# Patient Record
Sex: Female | Born: 1955 | Race: Black or African American | Hispanic: No | Marital: Single | Smoking: Former smoker
Health system: Southern US, Community
[De-identification: ages and names within clinical notes are randomized; demographics above are authoritative.]

## PROBLEM LIST (undated history)

## (undated) DIAGNOSIS — M1990 Unspecified osteoarthritis, unspecified site: Secondary | ICD-10-CM

## (undated) DIAGNOSIS — J349 Unspecified disorder of nose and nasal sinuses: Secondary | ICD-10-CM

## (undated) DIAGNOSIS — R51 Headache: Secondary | ICD-10-CM

## (undated) DIAGNOSIS — K5900 Constipation, unspecified: Secondary | ICD-10-CM

## (undated) DIAGNOSIS — J301 Allergic rhinitis due to pollen: Secondary | ICD-10-CM

## (undated) DIAGNOSIS — K5790 Diverticulosis of intestine, part unspecified, without perforation or abscess without bleeding: Secondary | ICD-10-CM

## (undated) DIAGNOSIS — K219 Gastro-esophageal reflux disease without esophagitis: Secondary | ICD-10-CM

## (undated) DIAGNOSIS — K760 Fatty (change of) liver, not elsewhere classified: Secondary | ICD-10-CM

## (undated) DIAGNOSIS — K8020 Calculus of gallbladder without cholecystitis without obstruction: Secondary | ICD-10-CM

## (undated) DIAGNOSIS — K589 Irritable bowel syndrome without diarrhea: Secondary | ICD-10-CM

## (undated) DIAGNOSIS — K5792 Diverticulitis of intestine, part unspecified, without perforation or abscess without bleeding: Secondary | ICD-10-CM

## (undated) DIAGNOSIS — K529 Noninfective gastroenteritis and colitis, unspecified: Secondary | ICD-10-CM

## (undated) HISTORY — DX: Irritable bowel syndrome without diarrhea: K58.9

## (undated) HISTORY — DX: Fatty (change of) liver, not elsewhere classified: K76.0

## (undated) HISTORY — DX: Calculus of gallbladder without cholecystitis without obstruction: K80.20

## (undated) HISTORY — PX: TONSILLECTOMY: SUR1361

## (undated) HISTORY — DX: Irritable bowel syndrome, unspecified: K58.9

## (undated) HISTORY — PX: FOOT SURGERY: SHX648

## (undated) HISTORY — PX: CATARACT EXTRACTION: SUR2

---

## 1997-07-07 ENCOUNTER — Inpatient Hospital Stay (HOSPITAL_COMMUNITY): Admission: EM | Admit: 1997-07-07 | Discharge: 1997-07-09 | Payer: Self-pay | Admitting: Emergency Medicine

## 1997-08-06 ENCOUNTER — Ambulatory Visit (HOSPITAL_COMMUNITY): Admission: RE | Admit: 1997-08-06 | Discharge: 1997-08-06 | Payer: Self-pay | Admitting: Gastroenterology

## 1997-10-10 ENCOUNTER — Ambulatory Visit (HOSPITAL_COMMUNITY): Admission: RE | Admit: 1997-10-10 | Discharge: 1997-10-10 | Payer: Self-pay | Admitting: Gastroenterology

## 1999-08-17 ENCOUNTER — Other Ambulatory Visit: Admission: RE | Admit: 1999-08-17 | Discharge: 1999-08-17 | Payer: Self-pay | Admitting: Obstetrics and Gynecology

## 2000-05-08 ENCOUNTER — Encounter: Payer: Self-pay | Admitting: Emergency Medicine

## 2000-05-08 ENCOUNTER — Emergency Department (HOSPITAL_COMMUNITY): Admission: EM | Admit: 2000-05-08 | Discharge: 2000-05-08 | Payer: Self-pay | Admitting: Emergency Medicine

## 2001-01-01 ENCOUNTER — Other Ambulatory Visit: Admission: RE | Admit: 2001-01-01 | Discharge: 2001-01-01 | Payer: Self-pay | Admitting: Obstetrics and Gynecology

## 2001-03-28 HISTORY — PX: ABDOMINAL HYSTERECTOMY: SHX81

## 2001-04-25 ENCOUNTER — Inpatient Hospital Stay (HOSPITAL_COMMUNITY): Admission: RE | Admit: 2001-04-25 | Discharge: 2001-04-28 | Payer: Self-pay | Admitting: Obstetrics and Gynecology

## 2002-05-15 ENCOUNTER — Other Ambulatory Visit: Admission: RE | Admit: 2002-05-15 | Discharge: 2002-05-15 | Payer: Self-pay | Admitting: Obstetrics and Gynecology

## 2003-01-11 ENCOUNTER — Emergency Department (HOSPITAL_COMMUNITY): Admission: EM | Admit: 2003-01-11 | Discharge: 2003-01-11 | Payer: Self-pay | Admitting: Emergency Medicine

## 2003-10-11 ENCOUNTER — Emergency Department (HOSPITAL_COMMUNITY): Admission: EM | Admit: 2003-10-11 | Discharge: 2003-10-11 | Payer: Self-pay | Admitting: *Deleted

## 2004-03-28 DIAGNOSIS — K5792 Diverticulitis of intestine, part unspecified, without perforation or abscess without bleeding: Secondary | ICD-10-CM

## 2004-03-28 HISTORY — DX: Diverticulitis of intestine, part unspecified, without perforation or abscess without bleeding: K57.92

## 2004-12-20 ENCOUNTER — Emergency Department (HOSPITAL_COMMUNITY): Admission: EM | Admit: 2004-12-20 | Discharge: 2004-12-21 | Payer: Self-pay | Admitting: Emergency Medicine

## 2011-02-26 HISTORY — PX: COLONOSCOPY: SHX174

## 2012-01-19 ENCOUNTER — Other Ambulatory Visit: Payer: Self-pay | Admitting: Orthopedic Surgery

## 2012-01-19 MED ORDER — BUPIVACAINE 0.25 % ON-Q PUMP SINGLE CATH 300ML: 300.0000 mL | INJECTION | Status: DC

## 2012-01-19 MED ORDER — DEXAMETHASONE SODIUM PHOSPHATE 10 MG/ML IJ SOLN: 10.0000 mg | Freq: Once | INTRAMUSCULAR | Status: DC

## 2012-01-19 NOTE — Progress Notes (Signed)
Preoperative surgical orders have been place into the [facility] hospital system for Holly [name] on 01/19/2012, 1:35 PM  by Patrica Duel for surgery on 01/30/12.  Preop Total Knee orders including Bupivacaine On-Q pump, IV Tylenol, and IV Decadron as long as there are no contraindications to the above medications. Avel Peace, PA-C

## 2012-01-20 ENCOUNTER — Encounter (HOSPITAL_COMMUNITY): Payer: Self-pay | Admitting: Pharmacy Technician

## 2012-01-24 ENCOUNTER — Encounter (HOSPITAL_COMMUNITY)
Admission: RE | Admit: 2012-01-24 | Discharge: 2012-01-24 | Disposition: A | Discharge status: Home / Self Care | Payer: BC Managed Care – PPO | Source: Ambulatory Visit | Attending: Orthopedic Surgery | Admitting: Orthopedic Surgery

## 2012-01-24 ENCOUNTER — Encounter (HOSPITAL_COMMUNITY): Payer: Self-pay

## 2012-01-24 HISTORY — DX: Unspecified osteoarthritis, unspecified site: M19.90

## 2012-01-24 HISTORY — DX: Allergic rhinitis due to pollen: J30.1

## 2012-01-24 HISTORY — DX: Unspecified disorder of nose and nasal sinuses: J34.9

## 2012-01-24 LAB — URINALYSIS, ROUTINE W REFLEX MICROSCOPIC
Hgb urine dipstick: NEGATIVE
Leukocytes, UA: NEGATIVE
Protein, ur: NEGATIVE mg/dL
Urobilinogen, UA: 1 mg/dL (ref 0.0–1.0)

## 2012-01-24 LAB — CBC
HCT: 45.2 % (ref 36.0–46.0)
Hemoglobin: 15.3 g/dL — ABNORMAL HIGH (ref 12.0–15.0)
MCH: 30.6 pg (ref 26.0–34.0)
MCHC: 33.8 g/dL (ref 30.0–36.0)

## 2012-01-24 LAB — COMPREHENSIVE METABOLIC PANEL
BUN: 13 mg/dL (ref 6–23)
Calcium: 9.8 mg/dL (ref 8.4–10.5)
Creatinine, Ser: 0.72 mg/dL (ref 0.50–1.10)
GFR calc Af Amer: >90 mL/min (ref >90)
Glucose, Bld: 108 mg/dL — ABNORMAL HIGH (ref 70–99)
Total Protein: 7.4 g/dL (ref 6.0–8.3)

## 2012-01-24 LAB — PROTIME-INR: Prothrombin Time: 13.1 seconds (ref 11.6–15.2)

## 2012-01-24 LAB — SURGICAL PCR SCREEN: MRSA, PCR: NEGATIVE

## 2012-01-24 MED ORDER — CHLORHEXIDINE GLUCONATE 4 % EX LIQD
60.0000 mL | Freq: Once | CUTANEOUS | Status: DC
  Filled 2012-01-24: qty 60

## 2012-01-24 NOTE — Pre-Procedure Instructions (Signed)
20 Holly [name]  01/24/2012   Your procedure is scheduled on:  Monday 01-30-2012  Report to [facility] at  AM.  [phone]  Call this number if you have problems the morning of surgery: [phone]   Remember:   Do not drink liquids or  eat food:After Midnight. Sunday night      Take these medicines the morning of surgery with A SIP OF WATER: Hydrocodone if needed   Do not wear jewelry, make-up or nail polish.  Do not wear lotions, powders, or perfumes. You may wear deodorant.  Do not shave 48 hours prior to surgery. Men may shave face and neck.  Do not bring valuables to the hospital.  Contacts, dentures or bridgework may not be worn into surgery.  Leave suitcase in the car. After surgery it may be brought to your room.  For patients admitted to the hospital, checkout time is 11:00 AM the day of discharge.   Patients discharged the day of surgery will not be allowed to drive home.  Name and phone number of your driver: Gustavo Lah [phone]  See [facility] Preparing for surgery sheet.   Please read over the following fact sheets that you were given: MRSA Information,blood transfusion,incentive spirometry

## 2012-01-25 NOTE — H&P (Signed)
TOTAL KNEE ADMISSION H&P  Patient is being admitted for left total knee arthroplasty.  Subjective:  Chief Complaint:left knee pain.  HPI: Holly [name], 56 y.o. female, has a history of pain and functional disability in the left knee due to arthritis and has failed non-surgical conservative treatments for greater than 12 weeks to includecorticosteriod injections, viscosupplementation injections and activity modification.  Onset of symptoms was gradual, starting 6 years ago with gradually worsening course since that time. The patient noted prior procedures on the knee to include  arthroscopy on the left knee(s).  Patient currently rates pain in the left knee(s) at 8 out of 10 with activity. Patient has night pain, worsening of pain with activity and weight bearing, pain that interferes with activities of daily living, pain with passive range of motion, crepitus and joint swelling.  Patient has evidence of subchondral cysts, periarticular osteophytes and joint space narrowing by imaging studies.  There is no active infection.   Past Medical History  Diagnosis Date  . Sinus problem   . Arthritis     knees  . Hay fever     Past Surgical History  Procedure Date  . Abdominal hysterectomy 2003    partial  . Tonsillectomy      Age   Current outpatient prescriptions: carisoprodol (SOMA) 350 MG tablet, Take 350 mg by mouth 3 (three) times daily as needed. Pain,   cetirizine (ZYRTEC) 10 MG tablet, Take 10 mg by mouth daily.,  diclofenac (VOLTAREN) 75 MG EC tablet, Take 75 mg by mouth 2 (two) times daily.,  diphenhydrAMINE (BENADRYL) 25 mg capsule, Take 25 mg by mouth every 6 (six) hours as needed. Allergy,  HYDROcodone-acetaminophen (NORCO/VICODIN) 5-325 MG per tablet, Take 1 tablet by mouth every 6 (six) hours as needed., promethazine (PHENERGAN) 25 MG tablet, Take 25 mg by mouth every 6 (six) hours as needed. Nausea,    Allergies  Allergen Reactions  . Codeine Nausea And Vomiting   fever  . Morphine And Related Nausea And Vomiting    fever    History  Substance Use Topics  . Smoking status: Current Every Day Smoker -- 0.5 packs/day for 40 years  . Smokeless tobacco: Never Used  . Alcohol Use: No   Family History Father deceased age 6 due to prostate cancer Mother living age 73, healthy but has osteoarthritis  Review of Systems  Constitutional: Positive for diaphoresis. Negative for fever, chills, weight loss and malaise/fatigue.  HENT: Negative.  Negative for neck pain.   Eyes: Positive for blurred vision. Negative for double vision, photophobia, pain, discharge and redness.  Respiratory: Negative.   Cardiovascular: Negative.   Gastrointestinal: Positive for nausea and constipation. Negative for heartburn, vomiting, abdominal pain, diarrhea, blood in stool and melena.  Genitourinary: Negative.   Musculoskeletal: Positive for joint pain. Negative for myalgias, back pain and falls.       Left knee pain  Skin: Positive for itching and rash.       face  Neurological: Negative.  Negative for weakness.  Endo/Heme/Allergies: Negative.   Psychiatric/Behavioral: Negative.     Objective:  Physical Exam  Constitutional: She is oriented to person, place, and time. She appears well-developed and well-nourished. No distress.  HENT:  Head: Normocephalic and atraumatic.  Right Ear: External ear normal.  Left Ear: External ear normal.  Nose: Nose normal.  Mouth/Throat: Oropharynx is clear and moist.  Eyes: Conjunctivae normal and EOM are normal.  Neck: Normal range of motion. Neck supple. No tracheal deviation present. No  thyromegaly present.  Cardiovascular: Normal rate, regular rhythm, normal heart sounds and intact distal pulses.   No murmur heard. Respiratory: Effort normal and breath sounds normal. No respiratory distress. She has no wheezes. She exhibits no tenderness.  GI: Soft. Bowel sounds are normal. She exhibits no distension and no mass. There is no  tenderness.  Musculoskeletal:       Right hip: Normal.       Left hip: Normal.       Right knee: She exhibits decreased range of motion. She exhibits no swelling and no effusion. no tenderness found.       Left knee: She exhibits decreased range of motion and swelling. She exhibits no effusion. tenderness found. Medial joint line tenderness noted.       Legs: Lymphadenopathy:    She has no cervical adenopathy.  Neurological: She is alert and oriented to person, place, and time. She has normal strength. No sensory deficit.  Skin: No rash noted. She is not diaphoretic. No erythema.  Psychiatric: She has a normal mood and affect.     Vitals Weight: 180 lb Height: 63 in Body Surface Area: 1.91 m Body Mass Index: 31.89 kg/m Pulse: 72 (Regular) Resp.: 16 (Unlabored) BP: 116/74 (Sitting, Left Arm, Standard)   Imaging Review Plain radiographs demonstrate severe degenerative joint disease of the left knee(s). The overall alignment is moderate varus. The bone quality appears to be good for age and reported activity level.  Assessment/Plan:  End stage arthritis, left knee   The patient history, physical examination, clinical judgment of the provider and imaging studies are consistent with end stage degenerative joint disease of the left knee(s) and total knee arthroplasty is deemed medically necessary. The treatment options including medical management, injection therapy arthroscopy and arthroplasty were discussed at length. The risks and benefits of total knee arthroplasty were presented and reviewed. The risks due to aseptic loosening, infection, stiffness, patella tracking problems, thromboembolic complications and other imponderables were discussed. The patient acknowledged the explanation, agreed to proceed with the plan and consent was signed. Patient is being admitted for inpatient treatment for surgery, pain control, PT, OT, prophylactic antibiotics, VTE prophylaxis,  progressive ambulation and ADL's and discharge planning. The patient is planning to be discharged home with home health services

## 2012-01-30 ENCOUNTER — Inpatient Hospital Stay (HOSPITAL_COMMUNITY)
Admission: RE | Admit: 2012-01-30 | Discharge: 2012-02-01 | DRG: 209 | Disposition: A | Discharge status: Home Health | Payer: BC Managed Care – PPO | Source: Ambulatory Visit | Attending: Orthopedic Surgery | Admitting: Orthopedic Surgery

## 2012-01-30 ENCOUNTER — Encounter (HOSPITAL_COMMUNITY): Payer: Self-pay | Admitting: *Deleted

## 2012-01-30 ENCOUNTER — Encounter (HOSPITAL_COMMUNITY)
Admission: RE | Disposition: A | Discharge status: Home Health | Payer: Self-pay | Source: Ambulatory Visit | Attending: Orthopedic Surgery

## 2012-01-30 ENCOUNTER — Encounter (HOSPITAL_COMMUNITY): Payer: Self-pay | Admitting: Anesthesiology

## 2012-01-30 ENCOUNTER — Encounter (HOSPITAL_COMMUNITY): Payer: Self-pay | Admitting: Orthopedic Surgery

## 2012-01-30 ENCOUNTER — Ambulatory Visit (HOSPITAL_COMMUNITY): Payer: BC Managed Care – PPO | Admitting: Anesthesiology

## 2012-01-30 DIAGNOSIS — E871 Hypo-osmolality and hyponatremia: Secondary | ICD-10-CM

## 2012-01-30 DIAGNOSIS — Z96659 Presence of unspecified artificial knee joint: Secondary | ICD-10-CM

## 2012-01-30 DIAGNOSIS — M179 Osteoarthritis of knee, unspecified: Secondary | ICD-10-CM

## 2012-01-30 DIAGNOSIS — M1710 Unilateral primary osteoarthritis, unspecified knee: Principal | ICD-10-CM | POA: Diagnosis present

## 2012-01-30 DIAGNOSIS — Z01812 Encounter for preprocedural laboratory examination: Secondary | ICD-10-CM

## 2012-01-30 DIAGNOSIS — F17200 Nicotine dependence, unspecified, uncomplicated: Secondary | ICD-10-CM | POA: Diagnosis present

## 2012-01-30 HISTORY — PX: TOTAL KNEE ARTHROPLASTY: SHX125

## 2012-01-30 LAB — TYPE AND SCREEN

## 2012-01-30 SURGERY — ARTHROPLASTY, KNEE, TOTAL
Anesthesia: Spinal | Site: Knee | Laterality: Left | Wound class: Clean

## 2012-01-30 MED ORDER — BUPIVACAINE 0.25 % ON-Q PUMP SINGLE CATH 300ML
INJECTION | Status: DC | PRN
  Administered 2012-01-30: 300 mL

## 2012-01-30 MED ORDER — ACETAMINOPHEN 325 MG PO TABS: 650.0000 mg | ORAL_TABLET | Freq: Four times a day (QID) | ORAL | Status: DC | PRN

## 2012-01-30 MED ORDER — MENTHOL 3 MG MT LOZG: 1.0000 | LOZENGE | OROMUCOSAL | Status: DC | PRN

## 2012-01-30 MED ORDER — PROPOFOL 10 MG/ML IV EMUL
INTRAVENOUS | Status: DC | PRN
  Administered 2012-01-30: 75 ug/kg/min via INTRAVENOUS

## 2012-01-30 MED ORDER — 0.9 % SODIUM CHLORIDE (POUR BTL) OPTIME
TOPICAL | Status: DC | PRN
  Administered 2012-01-30: 1000 mL

## 2012-01-30 MED ORDER — DEXAMETHASONE SODIUM PHOSPHATE 10 MG/ML IJ SOLN
10.0000 mg | Freq: Once | INTRAMUSCULAR | Status: AC
  Filled 2012-01-30: qty 1

## 2012-01-30 MED ORDER — CEFAZOLIN SODIUM-DEXTROSE 2-3 GM-% IV SOLR
2.0000 g | INTRAVENOUS | Status: AC
  Administered 2012-01-30: 2 g via INTRAVENOUS

## 2012-01-30 MED ORDER — ACETAMINOPHEN 650 MG RE SUPP: 650.0000 mg | Freq: Four times a day (QID) | RECTAL | Status: DC | PRN

## 2012-01-30 MED ORDER — TRAMADOL HCL 50 MG PO TABS
50.0000 mg | ORAL_TABLET | Freq: Four times a day (QID) | ORAL | Status: DC | PRN
Start: 2012-01-30 — End: 2012-02-01

## 2012-01-30 MED ORDER — SODIUM CHLORIDE 0.9 % IR SOLN
Status: DC | PRN
  Administered 2012-01-30: 1000 mL

## 2012-01-30 MED ORDER — BUPIVACAINE IN DEXTROSE 0.75-8.25 % IT SOLN
INTRATHECAL | Status: DC | PRN
  Administered 2012-01-30: 1.5 mL via INTRATHECAL

## 2012-01-30 MED ORDER — METOCLOPRAMIDE HCL 5 MG/ML IJ SOLN
5.0000 mg | Freq: Three times a day (TID) | INTRAMUSCULAR | Status: DC | PRN
  Administered 2012-01-30 – 2012-01-31 (×3): 10 mg via INTRAVENOUS
  Filled 2012-01-30 (×3): qty 2

## 2012-01-30 MED ORDER — MIDAZOLAM HCL 5 MG/5ML IJ SOLN
INTRAMUSCULAR | Status: DC | PRN
  Administered 2012-01-30: 2 mg via INTRAVENOUS

## 2012-01-30 MED ORDER — PROMETHAZINE HCL 25 MG PO TABS: 25.0000 mg | ORAL_TABLET | Freq: Four times a day (QID) | ORAL | Status: DC | PRN

## 2012-01-30 MED ORDER — HYDROMORPHONE HCL PF 1 MG/ML IJ SOLN
0.5000 mg | INTRAMUSCULAR | Status: DC | PRN
  Administered 2012-01-30 (×2): 1 mg via INTRAVENOUS
  Administered 2012-01-30: 0.5 mg via INTRAVENOUS
  Administered 2012-01-31 – 2012-02-01 (×3): 1 mg via INTRAVENOUS
  Filled 2012-01-30 (×5): qty 1

## 2012-01-30 MED ORDER — POLYETHYLENE GLYCOL 3350 17 G PO PACK: 17.0000 g | PACK | Freq: Every day | ORAL | Status: DC | PRN

## 2012-01-30 MED ORDER — ACETAMINOPHEN 10 MG/ML IV SOLN
INTRAVENOUS | Status: AC
  Filled 2012-01-30: qty 100

## 2012-01-30 MED ORDER — HYDROMORPHONE HCL PF 1 MG/ML IJ SOLN
INTRAMUSCULAR | Status: AC
  Filled 2012-01-30: qty 1

## 2012-01-30 MED ORDER — BUPIVACAINE 0.25 % ON-Q PUMP SINGLE CATH 300ML
INJECTION | Status: AC
  Filled 2012-01-30: qty 300

## 2012-01-30 MED ORDER — CEFAZOLIN SODIUM-DEXTROSE 2-3 GM-% IV SOLR
INTRAVENOUS | Status: AC
  Filled 2012-01-30: qty 50

## 2012-01-30 MED ORDER — HYDROMORPHONE HCL 2 MG PO TABS
2.0000 mg | ORAL_TABLET | ORAL | Status: DC | PRN
  Administered 2012-01-30 (×2): 4 mg via ORAL
  Administered 2012-01-31 (×3): 2 mg via ORAL
  Administered 2012-01-31: 4 mg via ORAL
  Administered 2012-01-31 – 2012-02-01 (×2): 2 mg via ORAL
  Administered 2012-02-01: 4 mg via ORAL
  Filled 2012-01-30: qty 1
  Filled 2012-01-30 (×2): qty 2
  Filled 2012-01-30: qty 1
  Filled 2012-01-30: qty 2
  Filled 2012-01-30: qty 1
  Filled 2012-01-30: qty 2
  Filled 2012-01-30 (×2): qty 1

## 2012-01-30 MED ORDER — FENTANYL CITRATE 0.05 MG/ML IJ SOLN
INTRAMUSCULAR | Status: DC | PRN
  Administered 2012-01-30: 100 ug via INTRAVENOUS

## 2012-01-30 MED ORDER — PHENOL 1.4 % MT LIQD: 1.0000 | OROMUCOSAL | Status: DC | PRN

## 2012-01-30 MED ORDER — METOCLOPRAMIDE HCL 10 MG PO TABS
5.0000 mg | ORAL_TABLET | Freq: Three times a day (TID) | ORAL | Status: DC | PRN
  Administered 2012-02-01: 10 mg via ORAL
  Filled 2012-01-30: qty 1

## 2012-01-30 MED ORDER — BISACODYL 10 MG RE SUPP: 10.0000 mg | Freq: Every day | RECTAL | Status: DC | PRN

## 2012-01-30 MED ORDER — ONDANSETRON HCL 4 MG/2ML IJ SOLN
4.0000 mg | Freq: Four times a day (QID) | INTRAMUSCULAR | Status: DC | PRN
  Administered 2012-01-31: 4 mg via INTRAVENOUS
  Filled 2012-01-30: qty 2

## 2012-01-30 MED ORDER — LACTATED RINGERS IV SOLN: INTRAVENOUS | Status: DC

## 2012-01-30 MED ORDER — ONDANSETRON HCL 4 MG PO TABS
4.0000 mg | ORAL_TABLET | Freq: Four times a day (QID) | ORAL | Status: DC | PRN
  Administered 2012-01-31: 4 mg via ORAL
  Filled 2012-01-30: qty 1

## 2012-01-30 MED ORDER — HYDROMORPHONE HCL PF 1 MG/ML IJ SOLN
INTRAMUSCULAR | Status: AC
  Administered 2012-01-30: 0.5 mg
  Filled 2012-01-30: qty 1

## 2012-01-30 MED ORDER — SODIUM CHLORIDE 0.9 % IV SOLN: INTRAVENOUS | Status: DC

## 2012-01-30 MED ORDER — ACETAMINOPHEN 10 MG/ML IV SOLN
1000.0000 mg | Freq: Four times a day (QID) | INTRAVENOUS | Status: AC
  Administered 2012-01-30 – 2012-01-31 (×4): 1000 mg via INTRAVENOUS
  Filled 2012-01-30 (×6): qty 100

## 2012-01-30 MED ORDER — LORATADINE 10 MG PO TABS
10.0000 mg | ORAL_TABLET | Freq: Every day | ORAL | Status: DC
  Administered 2012-01-30 – 2012-02-01 (×3): 10 mg via ORAL
  Filled 2012-01-30 (×3): qty 1

## 2012-01-30 MED ORDER — BUPIVACAINE ON-Q PAIN PUMP (FOR ORDER SET NO CHG)
INJECTION | Status: DC
  Filled 2012-01-30: qty 1

## 2012-01-30 MED ORDER — RIVAROXABAN 10 MG PO TABS
10.0000 mg | ORAL_TABLET | Freq: Every day | ORAL | Status: DC
  Administered 2012-01-31 – 2012-02-01 (×2): 10 mg via ORAL
  Filled 2012-01-30 (×3): qty 1

## 2012-01-30 MED ORDER — SODIUM CHLORIDE 0.9 % IV SOLN
INTRAVENOUS | Status: DC
  Administered 2012-01-30 – 2012-01-31 (×2): via INTRAVENOUS
  Administered 2012-01-31: 20 mL/h via INTRAVENOUS

## 2012-01-30 MED ORDER — ONDANSETRON HCL 4 MG/2ML IJ SOLN
INTRAMUSCULAR | Status: DC | PRN
  Administered 2012-01-30: 4 mg via INTRAVENOUS

## 2012-01-30 MED ORDER — METHOCARBAMOL 500 MG PO TABS
500.0000 mg | ORAL_TABLET | Freq: Four times a day (QID) | ORAL | Status: DC | PRN
  Administered 2012-01-31 – 2012-02-01 (×5): 500 mg via ORAL
  Filled 2012-01-30 (×5): qty 1

## 2012-01-30 MED ORDER — ACETAMINOPHEN 10 MG/ML IV SOLN
1000.0000 mg | Freq: Once | INTRAVENOUS | Status: AC
  Administered 2012-01-30: 1000 mg via INTRAVENOUS

## 2012-01-30 MED ORDER — DEXAMETHASONE 6 MG PO TABS
10.0000 mg | ORAL_TABLET | Freq: Once | ORAL | Status: AC
  Administered 2012-01-31: 10 mg via ORAL
  Filled 2012-01-30: qty 1

## 2012-01-30 MED ORDER — HYDROMORPHONE HCL PF 1 MG/ML IJ SOLN
0.2500 mg | INTRAMUSCULAR | Status: DC | PRN
  Administered 2012-01-30 (×2): 0.5 mg via INTRAVENOUS

## 2012-01-30 MED ORDER — METHOCARBAMOL 100 MG/ML IJ SOLN
500.0000 mg | Freq: Four times a day (QID) | INTRAVENOUS | Status: DC | PRN
  Administered 2012-01-30 (×2): 500 mg via INTRAVENOUS
  Filled 2012-01-30 (×2): qty 5

## 2012-01-30 MED ORDER — FLEET ENEMA 7-19 GM/118ML RE ENEM: 1.0000 | ENEMA | Freq: Once | RECTAL | Status: AC | PRN

## 2012-01-30 MED ORDER — LACTATED RINGERS IV SOLN
INTRAVENOUS | Status: DC | PRN
  Administered 2012-01-30 (×2): via INTRAVENOUS

## 2012-01-30 MED ORDER — DIPHENHYDRAMINE HCL 12.5 MG/5ML PO ELIX: 12.5000 mg | ORAL_SOLUTION | ORAL | Status: DC | PRN

## 2012-01-30 MED ORDER — DOCUSATE SODIUM 100 MG PO CAPS
100.0000 mg | ORAL_CAPSULE | Freq: Two times a day (BID) | ORAL | Status: DC
  Administered 2012-01-30 – 2012-02-01 (×4): 100 mg via ORAL

## 2012-01-30 MED ORDER — CEFAZOLIN SODIUM 1-5 GM-% IV SOLN
1.0000 g | Freq: Four times a day (QID) | INTRAVENOUS | Status: AC
  Administered 2012-01-30 (×2): 1 g via INTRAVENOUS
  Filled 2012-01-30 (×2): qty 50

## 2012-01-30 SURGICAL SUPPLY — 55 items
BAG SPEC THK2 15X12 ZIP CLS (MISCELLANEOUS) ×1
BAG ZIPLOCK 12X15 (MISCELLANEOUS) ×2 IMPLANT
BANDAGE ELASTIC 6 VELCRO ST LF (GAUZE/BANDAGES/DRESSINGS) ×2 IMPLANT
BANDAGE ESMARK 6X9 LF (GAUZE/BANDAGES/DRESSINGS) ×1 IMPLANT
BLADE SAG 18X100X1.27 (BLADE) ×2 IMPLANT
BLADE SAW SGTL 11.0X1.19X90.0M (BLADE) ×2 IMPLANT
BNDG CMPR 9X6 STRL LF SNTH (GAUZE/BANDAGES/DRESSINGS) ×1
BNDG ESMARK 6X9 LF (GAUZE/BANDAGES/DRESSINGS) ×2
BOWL SMART MIX CTS (DISPOSABLE) ×2 IMPLANT
CATH KIT ON-Q SILVERSOAK 5IN (CATHETERS) ×2 IMPLANT
CEMENT HV SMART SET (Cement) ×4 IMPLANT
CLOSURE STERI-STRIP 1/4X4 (GAUZE/BANDAGES/DRESSINGS) ×2 IMPLANT
CLOTH BEACON ORANGE TIMEOUT ST (SAFETY) ×2 IMPLANT
CUFF TOURN SGL QUICK 34 (TOURNIQUET CUFF) ×2
CUFF TRNQT CYL 34X4X40X1 (TOURNIQUET CUFF) ×1 IMPLANT
DRAPE EXTREMITY T 121X128X90 (DRAPE) ×2 IMPLANT
DRAPE POUCH INSTRU U-SHP 10X18 (DRAPES) ×2 IMPLANT
DRAPE U-SHAPE 47X51 STRL (DRAPES) ×2 IMPLANT
DRSG ADAPTIC 3X8 NADH LF (GAUZE/BANDAGES/DRESSINGS) ×2 IMPLANT
DRSG PAD ABDOMINAL 8X10 ST (GAUZE/BANDAGES/DRESSINGS) ×2 IMPLANT
DURAPREP 26ML APPLICATOR (WOUND CARE) ×2 IMPLANT
ELECT REM PT RETURN 9FT ADLT (ELECTROSURGICAL) ×2
ELECTRODE REM PT RTRN 9FT ADLT (ELECTROSURGICAL) ×1 IMPLANT
EVACUATOR 1/8 PVC DRAIN (DRAIN) ×2 IMPLANT
FACESHIELD LNG OPTICON STERILE (SAFETY) ×10 IMPLANT
GLOVE BIO SURGEON STRL SZ8 (GLOVE) ×2 IMPLANT
GLOVE BIOGEL PI IND STRL 8 (GLOVE) ×2 IMPLANT
GLOVE BIOGEL PI INDICATOR 8 (GLOVE) ×2
GLOVE ECLIPSE 8.0 STRL XLNG CF (GLOVE) ×2 IMPLANT
GLOVE SURG SS PI 6.5 STRL IVOR (GLOVE) ×4 IMPLANT
GOWN STRL NON-REIN LRG LVL3 (GOWN DISPOSABLE) ×4 IMPLANT
GOWN STRL REIN XL XLG (GOWN DISPOSABLE) ×2 IMPLANT
HANDPIECE INTERPULSE COAX TIP (DISPOSABLE) ×1
IMMOBILIZER KNEE 20 (SOFTGOODS) ×2
IMMOBILIZER KNEE 20 THIGH 36 (SOFTGOODS) ×1 IMPLANT
IMMOBILIZER KNEE 22 UNIV (SOFTGOODS) ×2 IMPLANT
KIT BASIN OR (CUSTOM PROCEDURE TRAY) ×2 IMPLANT
MANIFOLD NEPTUNE II (INSTRUMENTS) ×2 IMPLANT
NS IRRIG 1000ML POUR BTL (IV SOLUTION) ×2 IMPLANT
PACK TOTAL JOINT (CUSTOM PROCEDURE TRAY) ×2 IMPLANT
PAD ABD 7.5X8 STRL (GAUZE/BANDAGES/DRESSINGS) ×2 IMPLANT
PADDING CAST COTTON 6X4 STRL (CAST SUPPLIES) ×2 IMPLANT
POSITIONER SURGICAL ARM (MISCELLANEOUS) ×2 IMPLANT
SET HNDPC FAN SPRY TIP SCT (DISPOSABLE) ×1 IMPLANT
SPONGE GAUZE 4X4 12PLY (GAUZE/BANDAGES/DRESSINGS) ×2 IMPLANT
STRIP CLOSURE SKIN 1/2X4 (GAUZE/BANDAGES/DRESSINGS) ×4 IMPLANT
SUCTION FRAZIER 12FR DISP (SUCTIONS) ×2 IMPLANT
SUT MNCRL AB 4-0 PS2 18 (SUTURE) ×2 IMPLANT
SUT VIC AB 2-0 CT1 27 (SUTURE) ×6
SUT VIC AB 2-0 CT1 TAPERPNT 27 (SUTURE) ×3 IMPLANT
SUT VLOC 180 0 24IN GS25 (SUTURE) ×2 IMPLANT
TOWEL OR 17X26 10 PK STRL BLUE (TOWEL DISPOSABLE) ×4 IMPLANT
TRAY FOLEY CATH 14FRSI W/METER (CATHETERS) ×2 IMPLANT
WATER STERILE IRR 1500ML POUR (IV SOLUTION) ×2 IMPLANT
WRAP KNEE MAXI GEL POST OP (GAUZE/BANDAGES/DRESSINGS) ×4 IMPLANT

## 2012-01-30 NOTE — Anesthesia Procedure Notes (Signed)
Spinal  Patient location during procedure: OR End time: 01/30/2012 10:30 AM Staffing CRNA/Resident: Enriqueta Shutter Performed by: anesthesiologist and resident/CRNA  Preanesthetic Checklist Completed: patient identified, site marked, surgical consent, pre-op evaluation, timeout performed, IV checked, risks and benefits discussed and monitors and equipment checked Spinal Block Patient position: sitting Prep: Betadine Patient monitoring: heart rate, continuous pulse ox and blood pressure Approach: midline Location: L2-3 Injection technique: single-shot Needle Needle type: Spinocan, Pencil-Tip and Sprotte  Needle gauge: 24 G Needle length: 9 cm Assessment Sensory level: T6 Additional Notes Expiration date of kit checked and confirmed. Patient tolerated procedure well, without complications.

## 2012-01-30 NOTE — Transfer of Care (Signed)
Immediate Anesthesia Transfer of Care Note  Patient: Holly [name]  Procedure(s) Performed: Procedure(s) (LRB) with comments: TOTAL KNEE ARTHROPLASTY (Left)  Patient Location: PACU  Anesthesia Type:Regional  Level of Consciousness: awake, alert  and oriented  Airway & Oxygen Therapy: Patient Spontanous Breathing and Patient connected to face mask oxygen  Post-op Assessment: Report given to PACU RN and Post -op Vital signs reviewed and stable  Post vital signs: Reviewed and stable  Complications: No apparent anesthesia complications

## 2012-01-30 NOTE — Progress Notes (Signed)
Pacu nursing;  addl pain meds given in pacu; awaiting fire alarm to be cleared for transport to room 1619

## 2012-01-30 NOTE — Op Note (Signed)
Pre-operative diagnosis- Osteoarthritis  Left knee(s)  Post-operative diagnosis- Osteoarthritis Left knee(s)  Procedure-  Left  Total Knee Arthroplasty  Surgeon- Gus Rankin. Nahmir Zeidman, MD  Assistant- Amber constable, PA-C   Anesthesia-  Spinal EBL-* No blood loss amount entered *  Drains Hemovac  Tourniquet time-  Total Tourniquet Time Documented: Thigh (Left) - 34 minutes   Complications- None  Condition-PACU - hemodynamically stable.   Brief Clinical Note  Holly [name] is a 56 y.o. year old female with end stage OA of her left knee with progressively worsening pain and dysfunction. She has constant pain, with activity and at rest and significant functional deficits with difficulties even with ADLs. She has had extensive non-op management including analgesics, injections of cortisone and viscosupplements, and home exercise program, but remains in significant pain with significant dysfunction. Radiographs show bone on bone arthritis medial and patellofemoral. She presents now for left Total Knee Arthroplasty.    Procedure in detail---   The patient is brought into the operating room and positioned supine on the operating table. After successful administration of  Spinal,   a tourniquet is placed high on the  Left thigh(s) and the lower extremity is prepped and draped in the usual sterile fashion. Time out is performed by the operating team and then the  Left lower extremity is wrapped in Esmarch, knee flexed and the tourniquet inflated to 300 mmHg.       A midline incision is made with a ten blade through the subcutaneous tissue to the level of the extensor mechanism. A fresh blade is used to make a medial parapatellar arthrotomy. Soft tissue over the proximal medial tibia is subperiosteally elevated to the joint line with a knife and into the semimembranosus bursa with a Cobb elevator. Soft tissue over the proximal lateral tibia is elevated with attention being paid to avoiding the patellar  tendon on the tibial tubercle. The patella is everted, knee flexed 90 degrees and the ACL and PCL are removed. Findings are bone on bone medial and patellofemoral with patellar osteophytes.        The drill is used to create a starting hole in the distal femur and the canal is thoroughly irrigated with sterile saline to remove the fatty contents. The 5 degree Left  valgus alignment guide is placed into the femoral canal and the distal femoral cutting block is pinned to remove 10 mm off the distal femur. Resection is made with an oscillating saw.      The tibia is subluxed forward and the menisci are removed. The extramedullary alignment guide is placed referencing proximally at the medial aspect of the tibial tubercle and distally along the second metatarsal axis and tibial crest. The block is pinned to remove 2mm off the more deficient medial  side. Resection is made with an oscillating saw. Size 2.5is the most appropriate size for the tibia and the proximal tibia is prepared with the modular drill and keel punch for that size.      The femoral sizing guide is placed and size 3 is most appropriate. Rotation is marked off the epicondylar axis and confirmed by creating a rectangular flexion gap at 90 degrees. The size 3 cutting block is pinned in this rotation and the anterior, posterior and chamfer cuts are made with the oscillating saw. The intercondylar block is then placed and that cut is made.      Trial size 2.5 tibial component, trial size 3 posterior stabilized femur and a 10  mm  posterior stabilized rotating platform insert trial is placed. Full extension is achieved with excellent varus/valgus and anterior/posterior balance throughout full range of motion. The patella is everted and thickness measured to be 21  mm. Free hand resection is taken to 12 mm, a 35 template is placed, lug holes are drilled, trial patella is placed, and it tracks normally. Osteophytes are removed off the posterior femur with  the trial in place. All trials are removed and the cut bone surfaces prepared with pulsatile lavage. Cement is mixed and once ready for implantation, the size 2.5 tibial implant, size  3 posterior stabilized femoral component, and the size 35 patella are cemented in place and the patella is held with the clamp. The trial insert is placed and the knee held in full extension. All extruded cement is removed and once the cement is hard the permanent 10 mm posterior stabilized rotating platform insert is placed into the tibial tray.      The wound is copiously irrigated with saline solution and the extensor mechanism closed over a hemovac drain with #1 PDS suture. The tourniquet is released for a total tourniquet time of 34  minutes. Flexion against gravity is 135 degrees and the patella tracks normally. Subcutaneous tissue is closed with 2.0 vicryl and subcuticular with running 4.0 Monocryl. The catheter for the Marcaine pain pump is placed and the pump is initiated. The incision is cleaned and dried and steri-strips and a bulky sterile dressing are applied. The limb is placed into a knee immobilizer and the patient is awakened and transported to recovery in stable condition.      Please note that a surgical assistant was a medical necessity for this procedure in order to perform it in a safe and expeditious manner. Surgical assistant was necessary to retract the ligaments and vital neurovascular structures to prevent injury to them and also necessary for proper positioning of the limb to allow for anatomic placement of the prosthesis.   Gus Rankin Adron Geisel, MD    01/30/2012, 11:22 AM

## 2012-01-30 NOTE — Anesthesia Postprocedure Evaluation (Signed)
  Anesthesia Post-op Note  Patient: Holly [name]  Procedure(s) Performed: Procedure(s) (LRB): TOTAL KNEE ARTHROPLASTY (Left)  Patient Location: PACU  Anesthesia Type: Spinal  Level of Consciousness: awake and alert   Airway and Oxygen Therapy: Patient Spontanous Breathing  Post-op Pain: mild  Post-op Assessment: Post-op Vital signs reviewed, Patient's Cardiovascular Status Stable, Respiratory Function Stable, Patent Airway and No signs of Nausea or vomiting  Post-op Vital Signs: stable  Complications: No apparent anesthesia complications

## 2012-01-30 NOTE — Interval H&P Note (Signed)
History and Physical Interval Note:  01/30/2012 9:27 AM  Holly [name]  has presented today for surgery, with the diagnosis of osteoarthritis left knee  The various methods of treatment have been discussed with the patient and family. After consideration of risks, benefits and other options for treatment, the patient has consented to  Procedure(s) (LRB) with comments: TOTAL KNEE ARTHROPLASTY (Left) as a surgical intervention .  The patient's history has been reviewed, patient examined, no change in status, stable for surgery.  I have reviewed the patient's chart and labs.  Questions were answered to the patient's satisfaction.     Loanne Drilling

## 2012-01-30 NOTE — Anesthesia Preprocedure Evaluation (Signed)
Anesthesia Evaluation  Patient identified by MRN, date of birth, ID band Patient awake    Reviewed: Allergy & Precautions, H&P , NPO status , Patient's Chart, lab work & pertinent test results  Airway Mallampati: II TM Distance: >3 FB Neck ROM: full    Dental No notable dental hx. (+) Teeth Intact and Dental Advisory Given   Pulmonary Current Smoker,  breath sounds clear to auscultation  Pulmonary exam normal       Cardiovascular Exercise Tolerance: Good negative cardio ROS  Rhythm:regular Rate:Normal     Neuro/Psych negative neurological ROS  negative psych ROS   GI/Hepatic negative GI ROS, Neg liver ROS,   Endo/Other  negative endocrine ROS  Renal/GU negative Renal ROS  negative genitourinary   Musculoskeletal   Abdominal   Peds  Hematology negative hematology ROS (+)   Anesthesia Other Findings   Reproductive/Obstetrics negative OB ROS                           Anesthesia Physical Anesthesia Plan  ASA: II  Anesthesia Plan: Spinal   Post-op Pain Management:    Induction:   Airway Management Planned:   Additional Equipment:   Intra-op Plan:   Post-operative Plan:   Informed Consent: I have reviewed the patients History and Physical, chart, labs and discussed the procedure including the risks, benefits and alternatives for the proposed anesthesia with the patient or authorized representative who has indicated his/her understanding and acceptance.   Dental Advisory Given  Plan Discussed with: CRNA and Surgeon  Anesthesia Plan Comments:         Anesthesia Quick Evaluation

## 2012-01-30 NOTE — Progress Notes (Signed)
PT Note  Pt not ready for PT evaluation POD 0 today per RN.  Will check back tomorrow.  Zenovia Jarred, PT Pager: [phone]

## 2012-01-31 ENCOUNTER — Encounter (HOSPITAL_COMMUNITY): Payer: Self-pay | Admitting: Orthopedic Surgery

## 2012-01-31 DIAGNOSIS — E871 Hypo-osmolality and hyponatremia: Secondary | ICD-10-CM

## 2012-01-31 LAB — CBC
MCH: 29.9 pg (ref 26.0–34.0)
MCV: 89.4 fL (ref 78.0–100.0)
Platelets: 306 10*3/uL (ref 150–400)
RBC: 4.15 MIL/uL (ref 3.87–5.11)

## 2012-01-31 LAB — BASIC METABOLIC PANEL
CO2: 22 mEq/L (ref 19–32)
Calcium: 8.7 mg/dL (ref 8.4–10.5)
Creatinine, Ser: 0.5 mg/dL (ref 0.50–1.10)

## 2012-01-31 MED ORDER — PROMETHAZINE HCL 25 MG/ML IJ SOLN: 12.5000 mg | Freq: Four times a day (QID) | INTRAMUSCULAR | Status: DC | PRN

## 2012-01-31 NOTE — Evaluation (Signed)
Occupational Therapy Evaluation Patient Details Name: Holly [name] MRN: [id number] DOB: October 29, 1955 Today's Date: 01/31/2012 Time: 4098-1191 OT Time Calculation (min): 16 min  OT Assessment / Plan / Recommendation Clinical Impression  Pt presents with RTKR POD 1. Skilled OT recommended to maximize independence with BADLs to supervision level in prep for safe d/c home.    OT Assessment  Patient needs continued OT Services    Follow Up Recommendations  Home health OT    Barriers to Discharge      Equipment Recommendations  None recommended by OT    Recommendations for Other Services    Frequency  Min 2X/week    Precautions / Restrictions Precautions Precautions: Knee Required Braces or Orthoses: Knee Immobilizer - Left Restrictions Weight Bearing Restrictions: No   Pertinent Vitals/Pain Reported 6/10 pain with activity. Repositioned for comfort.    ADL  Grooming: Set up;Performed Where Assessed - Grooming: Unsupported sitting Lower Body Bathing: Performed;Minimal assistance Where Assessed - Lower Body Bathing: Supported sit to stand Lower Body Dressing: Simulated;Moderate assistance Where Assessed - Lower Body Dressing: Supported sit to Pharmacist, hospital: Performed;Minimal Dentist Method: Surveyor, minerals: Materials engineer and Hygiene: Performed;Minimal assistance Where Assessed - Engineer, mining and Hygiene: Sit to stand from 3-in-1 or toilet Equipment Used: Rolling walker Transfers/Ambulation Related to ADLs: Pt only agreeable to SPT to [facility] and wash peri area. Pt required steadying assist and assist w/clothing management. VCs for safety.    OT Diagnosis: Generalized weakness  OT Problem List: Decreased activity tolerance;Decreased safety awareness;Decreased knowledge of use of DME or AE;Pain OT Treatment Interventions: Self-care/ADL training;Therapeutic activities;DME and/or  AE instruction;Patient/family education   OT Goals Acute Rehab OT Goals OT Goal Formulation: With patient Time For Goal Achievement: 02/07/12 Potential to Achieve Goals: Good ADL Goals Pt Will Perform Grooming: with supervision;Standing at sink ADL Goal: Grooming - Progress: Goal set today Pt Will Perform Lower Body Bathing: with supervision;Sit to stand from chair;Sit to stand from bed ADL Goal: Lower Body Bathing - Progress: Goal set today Pt Will Perform Lower Body Dressing: with supervision;Sit to stand from chair;Sit to stand from bed ADL Goal: Lower Body Dressing - Progress: Goal set today Pt Will Transfer to Toilet: with supervision;3-in-1;Comfort height toilet;Ambulation ADL Goal: Toilet Transfer - Progress: Goal set today Pt Will Perform Toileting - Clothing Manipulation: with supervision;Sitting on 3-in-1 or toilet;Standing ADL Goal: Toileting - Clothing Manipulation - Progress: Goal set today Pt Will Perform Toileting - Hygiene: with supervision;Sit to stand from 3-in-1/toilet ADL Goal: Toileting - Hygiene - Progress: Goal set today Pt Will Perform Tub/Shower Transfer: with supervision;Shower transfer;Ambulation ADL Goal: Web designer - Progress: Goal set today  Visit Information  Last OT Received On: 01/31/12 Assistance Needed: +1    Subjective Data  Subjective: I really have to go to the bathroom Patient Stated Goal: Not asked.   Prior Functioning     Home Living Lives With: Family Available Help at Discharge: Family Type of Home: House Home Access: Stairs to enter [employer] of Steps: 5 in back 2 in front w no rails Entrance Stairs-Rails: Right Home Layout: One level Bathroom Shower/Tub: Health visitor: Standard Bathroom Accessibility: Yes How Accessible: Accessible via walker Home Adaptive Equipment: Walker - rolling;Bedside commode/3-in-1 Prior Function Level of Independence: Independent Able to Take Stairs?:  Yes Driving: Yes Vocation: Full time employment Communication Communication: No difficulties         Vision/Perception     Cognition  Overall Cognitive Status: Appears within functional limits for tasks assessed/performed Arousal/Alertness: Awake/alert Orientation Level: Appears intact for tasks assessed Behavior During Session: [facility] for tasks performed    Extremity/Trunk Assessment Right Upper Extremity Assessment RUE ROM/Strength/Tone: [facility] for tasks assessed Left Upper Extremity Assessment LUE ROM/Strength/Tone: WFL for tasks assessed     Mobility  Transfers Sit to Stand: 4: Min assist;From chair/3-in-1;With armrests;With upper extremity assist Stand to Sit: 4: Min assist;With upper extremity assist;To chair/3-in-1;With armrests Details for Transfer Assistance: physical A needed to rise, stabilize and control descent. Max vcs for hand placement and LE management.     Shoulder Instructions     Exercise    Balance     End of Session OT - End of Session Activity Tolerance: Patient tolerated treatment well Patient left: in chair;with call bell/phone within reach  GO     Cale Bethard A OTR/L [phone] 01/31/2012, 3:07 PM

## 2012-01-31 NOTE — Progress Notes (Signed)
Physical Therapy Treatment Patient Details Name: Holly [name] MRN: [id number] DOB: 01-Feb-1956 Today's Date: 01/31/2012 Time: 1431-1450 PT Time Calculation (min): 19 min  PT Assessment / Plan / Recommendation Comments on Treatment Session       Follow Up Recommendations  Home health PT     Does the patient have the potential to tolerate intense rehabilitation     Barriers to Discharge        Equipment Recommendations  None recommended by OT    Recommendations for Other Services OT consult  Frequency 7X/week   Plan Discharge plan remains appropriate    Precautions / Restrictions Precautions Precautions: Knee Required Braces or Orthoses: Knee Immobilizer - Left Knee Immobilizer - Left: Discontinue once straight leg raise with < 10 degree lag Restrictions Weight Bearing Restrictions: No   Pertinent Vitals/Pain     Mobility  Bed Mobility Bed Mobility: Sit to Supine Sit to Supine: 4: Min assist Details for Bed Mobility Assistance: cues for sequence with assist for L LE Transfers Transfers: Sit to Stand;Stand to Sit Sit to Stand: 4: Min assist;From chair/3-in-1;With armrests;With upper extremity assist Stand to Sit: 4: Min assist;With upper extremity assist;To chair/3-in-1;With armrests Details for Transfer Assistance: physical A needed to rise, stabilize and control descent. Max vcs for hand placement and LE management. Ambulation/Gait Ambulation/Gait Assistance: 4: Min assist Ambulation Distance (Feet): 58 Feet (and 20) Assistive device: Rolling walker Ambulation/Gait Assistance Details: cues for posture, stride length, position from RW and sequence Gait Pattern: Step-to pattern    Exercises     PT Diagnosis:    PT Problem List:   PT Treatment Interventions:     PT Goals Acute Rehab PT Goals PT Goal Formulation: With patient Time For Goal Achievement: 02/04/12 Potential to Achieve Goals: Good Pt will go Supine/Side to Sit: with supervision PT Goal:  Supine/Side to Sit - Progress: Progressing toward goal Pt will go Sit to Supine/Side: with supervision PT Goal: Sit to Supine/Side - Progress: Progressing toward goal Pt will go Sit to Stand: with supervision PT Goal: Sit to Stand - Progress: Progressing toward goal Pt will go Stand to Sit: with supervision PT Goal: Stand to Sit - Progress: Progressing toward goal Pt will Ambulate: 51 - 150 feet;with supervision;with rolling walker PT Goal: Ambulate - Progress: Progressing toward goal Pt will Go Up / Down Stairs: 3-5 stairs;with min assist;with least restrictive assistive device PT Goal: Up/Down Stairs - Progress: Goal set today  Visit Information  Last PT Received On: 01/31/12 Assistance Needed: +1    Subjective Data  Subjective: I just get tired and sweaty like an old [place] and have to sit down Patient Stated Goal: Resume previous lifestyle with decreased pain   Cognition  Overall Cognitive Status: Appears within functional limits for tasks assessed/performed Arousal/Alertness: Awake/alert Orientation Level: Appears intact for tasks assessed Behavior During Session: [facility] for tasks performed    Balance     End of Session PT - End of Session Equipment Utilized During Treatment: Left knee immobilizer Activity Tolerance: Patient limited by fatigue;Patient limited by pain Patient left: with call bell/phone within reach;in bed Nurse Communication: Mobility status   GP     Holly [name] 01/31/2012, 3:44 PM

## 2012-01-31 NOTE — Care Management Note (Addendum)
    Page 1 of 2   02/01/2012     2:45:49 PM   CARE MANAGEMENT NOTE 02/01/2012  Patient:  Holly [name], Holly [name]   Account Number:  [id number]  Date Initiated:  01/31/2012  Documentation initiated by:  Colleen Can  Subjective/Objective Assessment:   dx osteoarthritis left knee; Total knee replacemnt     Action/Plan:   CM spoke with patient and spouse. Pt plans to return to [place], [place] where spouse will be caregiver. She has access to RW, BSC and crutches. Spouse is requesting HH agency in network   Anticipated DC Date:  02/02/2012   Anticipated DC Plan:  HOME W HOME HEALTH SERVICES  In-house referral  NA      DC Planning Services  CM consult      [facility] Choice  HOME HEALTH   Choice offered to / List presented to:  C-3 Spouse   DME arranged  NA      DME agency  NA     HH arranged  HH-2 PT      HH agency  [facility]   Status of service:  Completed, signed off Medicare Important Message given?  NO (If response is "NO", the following Medicare IM given date fields will be blank) Date Medicare IM given:   Date Additional Medicare IM given:    Discharge Disposition:    Per UR Regulation:  Reviewed for med. necessity/level of care/duration of stay  If discussed at Long Length of Stay Meetings, dates discussed:    Comments:  11/06/;2013 Damaris Schooner RN CCM [phone] Pt discharge today with [facility] services in place. Services will start tomorrow 02/02/2012.  01/31/2012 Damaris Schooner RN CCM [phone] [facility] no PT available for services in [place] this week [facility]-NO physical therapy  resources available [facility] care contacted -can not provide services Genevieve Norlander is not in network [facility] REP contacted and can provide HHpt.

## 2012-01-31 NOTE — Progress Notes (Signed)
   Subjective: 1 Day Post-Op Procedure(s) (LRB): TOTAL KNEE ARTHROPLASTY (Left) Patient reports pain as moderate and severe pain thru the night.  Also had some problems with nausea but that seems better this morning.   Patient seen in rounds by Dr. Lequita Halt. Patient is having problems with pain in the knee, requiring pain medications We will start therapy today.  Plan is to go Home after hospital stay.  Objective: Vital signs in last 24 hours: Temp:  [97.4 F (36.3 C)-98.3 F (36.8 C)] 98.1 F (36.7 C) (11/05 0459) Pulse Rate:  [55-82] 71  (11/05 0459) Resp:  [10-20] 17  (11/05 0459) BP: (110-162)/(63-93) 162/84 mmHg (11/05 0459) SpO2:  [97 %-100 %] 98 % (11/05 0459) Weight:  [80.74 kg (178 lb)] 80.74 kg (178 lb) (11/04 1317)  Intake/Output from previous day:  Intake/Output Summary (Last 24 hours) at 01/31/12 0741 Last data filed at 01/31/12 0600  Gross per 24 hour  Intake 3415.75 ml  Output   1825 ml  Net 1590.75 ml    Intake/Output this shift: UOP 650  Labs:  Basename 01/31/12 0445  HGB 12.4    Basename 01/31/12 0445  WBC 11.0*  RBC 4.15  HCT 37.1  PLT 306    Basename 01/31/12 0445  NA 129*  K 4.0  CL 98  CO2 22  BUN 11  CREATININE 0.50  GLUCOSE 119*  CALCIUM 8.7   No results found for this basename: LABPT:2,INR:2 in the last 72 hours  EXAM General - Patient is Alert, Appropriate and Oriented Extremity - Neurovascular intact Sensation intact distally Dorsiflexion/Plantar flexion intact Dressing - dressing C/D/I Motor Function - intact, moving foot and toes well on exam.  Hemovac pulled without difficulty.  Past Medical History  Diagnosis Date  . Sinus problem   . Arthritis     knees  . Hay fever     Assessment/Plan: 1 Day Post-Op Procedure(s) (LRB): TOTAL KNEE ARTHROPLASTY (Left) Principal Problem:  *OA (osteoarthritis) of knee Active Problems:  Postop Hyponatremia  Estimated Body mass index is 28.73 kg/(m^2) as calculated from the  following:   Height as of this encounter: 5\' 6" (1.676 m).   Weight as of this encounter: 178 lb(80.74 kg). Advance diet Up with therapy Discharge home with home health  DVT Prophylaxis - Xarelto Weight-Bearing as tolerated to left leg No vaccines. D/C O2 and Pulse OX and try on Room [address]  Patrica Duel 01/31/2012, 7:41 AM

## 2012-01-31 NOTE — Progress Notes (Signed)
Physical Therapy Evaluation Patient Details Name: Holly [name] MRN: [id number] DOB: 06-11-1955 Today's Date: 01/31/2012 Time: 1110-1140 PT Time Calculation (min): 30 min  PT Assessment / Plan / Recommendation Clinical Impression  Pt s/p L TKR presents with decreased L LE strength/ROM limiting functional mobility    PT Assessment  Patient needs continued PT services    Follow Up Recommendations  Home health PT    Does the patient have the potential to tolerate intense rehabilitation      Barriers to Discharge        Equipment Recommendations  None recommended by PT    Recommendations for Other Services OT consult   Frequency 7X/week    Precautions / Restrictions Precautions Precautions: Knee Required Braces or Orthoses: Knee Immobilizer - Left Restrictions Weight Bearing Restrictions: No   Pertinent Vitals/Pain 7/10; premedicated, cold packs provided      Mobility  Bed Mobility Bed Mobility: Supine to Sit Supine to Sit: 4: Min assist;3: Mod assist Details for Bed Mobility Assistance: cues for sequence with assist for L LE Transfers Transfers: Sit to Stand;Stand to Sit Sit to Stand: 4: Min assist;3: Mod assist Stand to Sit: 4: Min assist;3: Mod assist Details for Transfer Assistance: cues for use of UEs and for LE management Ambulation/Gait Ambulation/Gait Assistance: 3: Mod assist Ambulation Distance (Feet): 28 Feet Assistive device: Rolling walker Ambulation/Gait Assistance Details: cues for posture, sequence, position from RW and stride length Gait Pattern: Step-to pattern    Shoulder Instructions     Exercises Total Joint Exercises Ankle Circles/Pumps: AROM;Both;10 reps;Supine Quad Sets: AROM;10 reps;Supine;Both Heel Slides: AROM;10 reps;Supine;Left Straight Leg Raises: AROM;Left;10 reps;Supine   PT Diagnosis: Difficulty walking  PT Problem List: Decreased strength;Decreased range of motion;Decreased activity tolerance;Decreased mobility;Decreased  knowledge of use of DME;Pain PT Treatment Interventions:     PT Goals Acute Rehab PT Goals PT Goal Formulation: With patient Time For Goal Achievement: 02/04/12 Potential to Achieve Goals: Good Pt will go Supine/Side to Sit: with supervision PT Goal: Supine/Side to Sit - Progress: Goal set today Pt will go Sit to Supine/Side: with supervision PT Goal: Sit to Supine/Side - Progress: Goal set today Pt will go Sit to Stand: with supervision PT Goal: Sit to Stand - Progress: Goal set today Pt will go Stand to Sit: with supervision PT Goal: Stand to Sit - Progress: Goal set today Pt will Ambulate: 51 - 150 feet;with supervision;with rolling walker PT Goal: Ambulate - Progress: Goal set today Pt will Go Up / Down Stairs: 3-5 stairs;with min assist;with least restrictive assistive device PT Goal: Up/Down Stairs - Progress: Goal set today  Visit Information  Last PT Received On: 01/31/12 Assistance Needed: +1    Subjective Data  Subjective: I don't ever want to go through this again Patient Stated Goal: Resume previous lifestyle with decreased pain   Prior Functioning  Home Living Lives With: Family Available Help at Discharge: Family Type of Home: House Home Access: Stairs to enter Secretary/administrator of Steps: 5 Entrance Stairs-Rails: Right Home Layout: One level Home Adaptive Equipment: Walker - rolling Prior Function Level of Independence: Independent Able to Take Stairs?: Yes Driving: Yes Communication Communication: No difficulties    Cognition  Overall Cognitive Status: Appears within functional limits for tasks assessed/performed Arousal/Alertness: Awake/alert Orientation Level: Appears intact for tasks assessed Behavior During Session: [facility] for tasks performed    Extremity/Trunk Assessment Right Upper Extremity Assessment RUE ROM/Strength/Tone: [facility] for tasks assessed Left Upper Extremity Assessment LUE ROM/Strength/Tone: [facility] for tasks assessed Right Lower  Extremity Assessment RLE ROM/Strength/Tone: [facility] for tasks assessed Left Lower Extremity Assessment LLE ROM/Strength/Tone: Deficits LLE ROM/Strength/Tone Deficits: Quads 2/5 wtih AAROM at knee -10 - 25   Balance    End of Session PT - End of Session Equipment Utilized During Treatment: Left knee immobilizer Activity Tolerance: Patient limited by fatigue;Patient limited by pain Patient left: in chair;with call bell/phone within reach Nurse Communication: Mobility status  GP     Holly [name] 01/31/2012, 1:02 PM

## 2012-02-01 LAB — BASIC METABOLIC PANEL
Calcium: 9.3 mg/dL (ref 8.4–10.5)
Chloride: 97 mEq/L (ref 96–112)
Creatinine, Ser: 0.63 mg/dL (ref 0.50–1.10)
GFR calc Af Amer: >90 mL/min (ref >90)
GFR calc non Af Amer: >90 mL/min (ref >90)

## 2012-02-01 LAB — CBC
MCH: 30.1 pg (ref 26.0–34.0)
MCV: 88.2 fL (ref 78.0–100.0)
Platelets: 370 10*3/uL (ref 150–400)
RDW: 12.8 % (ref 11.5–15.5)
WBC: 12.7 10*3/uL — ABNORMAL HIGH (ref 4.0–10.5)

## 2012-02-01 MED ORDER — RIVAROXABAN 10 MG PO TABS: 10.0000 mg | ORAL_TABLET | Freq: Every day | ORAL | Status: DC

## 2012-02-01 MED ORDER — METHOCARBAMOL 500 MG PO TABS: 500.0000 mg | ORAL_TABLET | Freq: Four times a day (QID) | ORAL | Status: DC | PRN

## 2012-02-01 MED ORDER — HYDROMORPHONE HCL 2 MG PO TABS: 2.0000 mg | ORAL_TABLET | ORAL | Status: DC | PRN

## 2012-02-01 NOTE — Progress Notes (Signed)
Physical Therapy Treatment Patient Details Name: Holly [name] MRN: [id number] DOB: 09-27-1955 Today's Date: 02/01/2012 Time: 3474-2595 PT Time Calculation (min): 30 min  PT Assessment / Plan / Recommendation Comments on Treatment Session  Reviewed don/doff KI with pt    Follow Up Recommendations  Home health PT     Does the patient have the potential to tolerate intense rehabilitation     Barriers to Discharge        Equipment Recommendations  None recommended by OT    Recommendations for Other Services OT consult  Frequency 7X/week   Plan Discharge plan remains appropriate    Precautions / Restrictions Precautions Precautions: Knee Required Braces or Orthoses: Knee Immobilizer - Left Knee Immobilizer - Left: Discontinue once straight leg raise with < 10 degree lag Restrictions Weight Bearing Restrictions: No   Pertinent Vitals/Pain 5-6/10 with activity; premedicated, cold packs provided    Mobility  Bed Mobility Details for Bed Mobility Assistance: cues for sequence with assist for L LE Transfers Transfers: Sit to Stand;Stand to Sit Sit to Stand: 4: Min guard;With upper extremity assist;From chair/3-in-1 Stand to Sit: 4: Min guard;With upper extremity assist;To chair/3-in-1 Details for Transfer Assistance: min cues for use of UEs and for LE management Ambulation/Gait Ambulation/Gait Assistance: 4: Min guard Ambulation Distance (Feet): 123 Feet Assistive device: Rolling walker Ambulation/Gait Assistance Details: cues for sequence, posture, position from RW and stride length Gait Pattern: Step-to pattern    Exercises Total Joint Exercises Ankle Circles/Pumps: AROM;Both;Supine;20 reps Quad Sets: AROM;Supine;Both;20 reps Heel Slides: AROM;Supine;Left;20 reps Straight Leg Raises: AAROM;20 reps;Supine;Left   PT Diagnosis:    PT Problem List:   PT Treatment Interventions:     PT Goals Acute Rehab PT Goals PT Goal Formulation: With patient Time For Goal  Achievement: 02/04/12 Potential to Achieve Goals: Good Pt will go Supine/Side to Sit: with supervision PT Goal: Supine/Side to Sit - Progress: Progressing toward goal Pt will go Sit to Supine/Side: with supervision PT Goal: Sit to Supine/Side - Progress: Progressing toward goal Pt will go Sit to Stand: with supervision PT Goal: Sit to Stand - Progress: Progressing toward goal Pt will go Stand to Sit: with supervision PT Goal: Stand to Sit - Progress: Progressing toward goal Pt will Ambulate: 51 - 150 feet;with supervision;with rolling walker PT Goal: Ambulate - Progress: Progressing toward goal  Visit Information  Last PT Received On: 02/01/12 Assistance Needed: +1    Subjective Data  Subjective: I'm going home today Patient Stated Goal: Resume previous lifestyle with decreased pain   Cognition  Overall Cognitive Status: Appears within functional limits for tasks assessed/performed Arousal/Alertness: Awake/alert Orientation Level: Appears intact for tasks assessed Behavior During Session: [facility] for tasks performed    Balance     End of Session PT - End of Session Equipment Utilized During Treatment: Left knee immobilizer Activity Tolerance: Patient tolerated treatment well;Patient limited by fatigue Patient left: in chair;with call bell/phone within reach Nurse Communication: Mobility status   GP     Emanuel Dowson 02/01/2012, 12:00 PM

## 2012-02-01 NOTE — Discharge Summary (Signed)
Physician Discharge Summary   Patient ID: Holly [name] MRN: [id number] DOB/AGE: Sep 14, 1955 56 y.o.  Admit date: 01/30/2012 Discharge date: 02/01/2012  Primary Diagnosis: Osteoarthritis Left knee   Admission Diagnoses:  Past Medical History  Diagnosis Date  . Sinus problem   . Arthritis     knees  . Hay fever    Discharge Diagnoses:   Principal Problem:  *OA (osteoarthritis) of knee Active Problems:  Postop Hyponatremia  Estimated Body mass index is 28.73 kg/(m^2) as calculated from the following:   Height as of this encounter: 5\' 6" (1.676 m).   Weight as of this encounter: 178 lb(80.74 kg).  Classification of overweight in adults according to BMI (WHO, 1998)   Procedure:  Procedure(s) (LRB): TOTAL KNEE ARTHROPLASTY (Left)   Consults: None  HPI: Holly [name] is a 56 y.o. year old female with end stage OA of her left knee with progressively worsening pain and dysfunction. She has constant pain, with activity and at rest and significant functional deficits with difficulties even with ADLs. She has had extensive non-op management including analgesics, injections of cortisone and viscosupplements, and home exercise program, but remains in significant pain with significant dysfunction. Radiographs show bone on bone arthritis medial and patellofemoral. She presents now for left Total Knee Arthroplasty.   Laboratory Data: Admission on 01/30/2012  Component Date Value Range Status  . ABO/RH(D) 01/30/2012 O POS   Final  . Antibody Screen 01/30/2012 NEG   Final  . Sample Expiration 01/30/2012 02/02/2012   Final  . ABO/RH(D) 01/30/2012 O POS   Final  . WBC 01/31/2012 11.0* 4.0 - 10.5 K/uL Final  . RBC 01/31/2012 4.15  3.87 - 5.11 MIL/uL Final  . Hemoglobin 01/31/2012 12.4  12.0 - 15.0 g/dL Final  . HCT 40/98/1191 37.1  36.0 - 46.0 % Final  . MCV 01/31/2012 89.4  78.0 - 100.0 fL Final  . MCH 01/31/2012 29.9  26.0 - 34.0 pg Final  . MCHC 01/31/2012 33.4  30.0 - 36.0 g/dL Final   . RDW 47/82/9562 12.8  11.5 - 15.5 % Final  . Platelets 01/31/2012 306  150 - 400 K/uL Final  . Sodium 01/31/2012 129* 135 - 145 mEq/L Final  . Potassium 01/31/2012 4.0  3.5 - 5.1 mEq/L Final  . Chloride 01/31/2012 98  96 - 112 mEq/L Final  . CO2 01/31/2012 22  19 - 32 mEq/L Final  . Glucose, Bld 01/31/2012 119* 70 - 99 mg/dL Final  . BUN 13/10/6576 11  6 - 23 mg/dL Final  . Creatinine, Ser 01/31/2012 0.50  0.50 - 1.10 mg/dL Final  . Calcium 46/96/2952 8.7  8.4 - 10.5 mg/dL Final  . GFR calc non Af Amer 01/31/2012 >90  >90 mL/min Final  . GFR calc Af Amer 01/31/2012 >90  >90 mL/min Final   Comment:                                 The eGFR has been calculated                          using the CKD EPI equation.                          This calculation has not been  validated in all clinical                          situations.                          eGFR's persistently                          <90 mL/min signify                          possible Chronic Kidney Disease.  . WBC 02/01/2012 12.7* 4.0 - 10.5 K/uL Final  . RBC 02/01/2012 4.25  3.87 - 5.11 MIL/uL Final  . Hemoglobin 02/01/2012 12.8  12.0 - 15.0 g/dL Final  . HCT 40/98/1191 37.5  36.0 - 46.0 % Final  . MCV 02/01/2012 88.2  78.0 - 100.0 fL Final  . MCH 02/01/2012 30.1  26.0 - 34.0 pg Final  . MCHC 02/01/2012 34.1  30.0 - 36.0 g/dL Final  . RDW 47/82/9562 12.8  11.5 - 15.5 % Final  . Platelets 02/01/2012 370  150 - 400 K/uL Final  . Sodium 02/01/2012 132* 135 - 145 mEq/L Final  . Potassium 02/01/2012 4.0  3.5 - 5.1 mEq/L Final  . Chloride 02/01/2012 97  96 - 112 mEq/L Final  . CO2 02/01/2012 24  19 - 32 mEq/L Final  . Glucose, Bld 02/01/2012 126* 70 - 99 mg/dL Final  . BUN 13/10/6576 9  6 - 23 mg/dL Final  . Creatinine, Ser 02/01/2012 0.63  0.50 - 1.10 mg/dL Final  . Calcium 46/96/2952 9.3  8.4 - 10.5 mg/dL Final  . GFR calc non Af Amer 02/01/2012 >90  >90 mL/min Final  . GFR calc Af Amer  02/01/2012 >90  >90 mL/min Final   Comment:                                 The eGFR has been calculated                          using the CKD EPI equation.                          This calculation has not been                          validated in all clinical                          situations.                          eGFR's persistently                          <90 mL/min signify                          possible Chronic Kidney Disease.  Hospital Outpatient Visit on 01/24/2012  Component Date Value Range Status  . aPTT 01/24/2012 35  24 - 37 seconds Final  . WBC 01/24/2012 6.2  4.0 - 10.5 K/uL Final  .  RBC 01/24/2012 5.00  3.87 - 5.11 MIL/uL Final  . Hemoglobin 01/24/2012 15.3* 12.0 - 15.0 g/dL Final  . HCT 16/12/9602 45.2  36.0 - 46.0 % Final  . MCV 01/24/2012 90.4  78.0 - 100.0 fL Final  . MCH 01/24/2012 30.6  26.0 - 34.0 pg Final  . MCHC 01/24/2012 33.8  30.0 - 36.0 g/dL Final  . RDW 54/11/8117 13.1  11.5 - 15.5 % Final  . Platelets 01/24/2012 260  150 - 400 K/uL Final  . Sodium 01/24/2012 138  135 - 145 mEq/L Final  . Potassium 01/24/2012 4.1  3.5 - 5.1 mEq/L Final  . Chloride 01/24/2012 103  96 - 112 mEq/L Final  . CO2 01/24/2012 23  19 - 32 mEq/L Final  . Glucose, Bld 01/24/2012 108* 70 - 99 mg/dL Final  . BUN 14/78/2956 13  6 - 23 mg/dL Final  . Creatinine, Ser 01/24/2012 0.72  0.50 - 1.10 mg/dL Final  . Calcium 21/30/8657 9.8  8.4 - 10.5 mg/dL Final  . Total Protein 01/24/2012 7.4  6.0 - 8.3 g/dL Final  . Albumin 84/69/6295 3.7  3.5 - 5.2 g/dL Final  . AST 28/41/3244 16  0 - 37 U/L Final  . ALT 01/24/2012 23  0 - 35 U/L Final  . Alkaline Phosphatase 01/24/2012 135* 39 - 117 U/L Final  . Total Bilirubin 01/24/2012 0.8  0.3 - 1.2 mg/dL Final  . GFR calc non Af Amer 01/24/2012 >90  >90 mL/min Final  . GFR calc Af Amer 01/24/2012 >90  >90 mL/min Final   Comment:                                 The eGFR has been calculated                          using the CKD EPI  equation.                          This calculation has not been                          validated in all clinical                          situations.                          eGFR's persistently                          <90 mL/min signify                          possible Chronic Kidney Disease.  Marland Kitchen Prothrombin Time 01/24/2012 13.1  11.6 - 15.2 seconds Final  . INR 01/24/2012 1.00  0.00 - 1.49 Final  . Color, Urine 01/24/2012 AMBER* YELLOW Final   BIOCHEMICALS MAY BE AFFECTED BY COLOR  . APPearance 01/24/2012 CLOUDY* CLEAR Final  . Specific Gravity, Urine 01/24/2012 1.028  1.005 - 1.030 Final  . pH 01/24/2012 6.0  5.0 - 8.0 Final  . Glucose, UA 01/24/2012 NEGATIVE  NEGATIVE mg/dL Final  . Hgb urine dipstick 01/24/2012 NEGATIVE  NEGATIVE Final  . Bilirubin Urine 01/24/2012 SMALL*  NEGATIVE Final  . Ketones, ur 01/24/2012 TRACE* NEGATIVE mg/dL Final  . Protein, ur 96/06/5407 NEGATIVE  NEGATIVE mg/dL Final  . Urobilinogen, UA 01/24/2012 1.0  0.0 - 1.0 mg/dL Final  . Nitrite 81/19/1478 NEGATIVE  NEGATIVE Final  . Leukocytes, UA 01/24/2012 NEGATIVE  NEGATIVE Final   MICROSCOPIC NOT DONE ON URINES WITH NEGATIVE PROTEIN, BLOOD, LEUKOCYTES, NITRITE, OR GLUCOSE <1000 mg/dL.  Marland Kitchen MRSA, PCR 01/24/2012 NEGATIVE  NEGATIVE Final  . Staphylococcus aureus 01/24/2012 NEGATIVE  NEGATIVE Final   Comment:                                 The Xpert SA Assay (FDA                          approved for NASAL specimens                          in patients over 44 years of age),                          is one component of                          a comprehensive surveillance                          program.  Test performance has                          been validated by [employer] for patients greater                          than or equal to 46 year old.                          It is not intended                          to diagnose infection nor to                           guide or monitor treatment.     X-Rays:No results found.  EKG:No orders found for this or any previous visit.   Hospital Course:  KAFI DOTTER is a 56 y.o. who was admitted to [facility]. They were brought to the operating room on 01/30/2012 and underwent Procedure(s): TOTAL KNEE ARTHROPLASTY.  Patient tolerated the procedure well and was later transferred to the recovery room and then to the orthopaedic floor for postoperative care.  They were given PO and IV analgesics for pain control following their surgery.  They were given 24 hours of postoperative antibiotics of  Anti-infectives     Start     Dose/Rate Route Frequency Ordered Stop   01/30/12 1630   ceFAZolin (ANCEF) IVPB 1 g/50 mL premix        1 g 100 mL/hr over 30 Minutes Intravenous Every 6 hours 01/30/12 1322 01/30/12  2209   01/30/12 0821   ceFAZolin (ANCEF) IVPB 2 g/50 mL premix        2 g 100 mL/hr over 30 Minutes Intravenous 60 min pre-op 01/30/12 1610 01/30/12 1033         and started on DVT prophylaxis in the form of Xarelto.   PT and OT were ordered for total joint protocol.  Discharge planning consulted to help with postop disposition and equipment needs.  Patient had a rough night on the evening of surgery due to pain but started to get up OOB with therapy on day one. They walked over 50 feet that afternoon.  Hemovac drain was pulled without difficulty.  Continued to work with therapy into day two walking over 100 feet twice.  Dressing was changed on day two and the incision was healing well.  Patient was seen in rounds and was ready to go home later that afternoon on day two.   Discharge Medications: Prior to Admission medications   Medication Sig Start Date End Date Taking? Authorizing Provider  cetirizine (ZYRTEC) 10 MG tablet Take 10 mg by mouth daily.    Historical Provider, MD  diphenhydrAMINE (BENADRYL) 25 mg capsule Take 25 mg by mouth every 6 (six) hours as needed. Allergy    Historical Provider, MD   hydrocortisone (CORTEF) 5 MG tablet Take 5 mg by mouth daily. Stated  She was never taking this medication    Historical Provider, MD  HYDROmorphone (DILAUDID) 2 MG tablet Take 1-2 tablets (2-4 mg total) by mouth every 4 (four) hours as needed. 02/01/12   Branda Chaudhary Julien Girt, PA  methocarbamol (ROBAXIN) 500 MG tablet Take 1 tablet (500 mg total) by mouth every 6 (six) hours as needed. 02/01/12   Anisah Kuck Julien Girt, PA  promethazine (PHENERGAN) 25 MG tablet Take 25 mg by mouth every 6 (six) hours as needed. Nausea    Historical Provider, MD  rivaroxaban (XARELTO) 10 MG TABS tablet Take 1 tablet (10 mg total) by mouth daily with breakfast. Take Xarelto for two and a half more weeks, then discontinue Xarelto. 02/01/12   Geralynn Capri Julien Girt, PA    Diet: Regular diet Activity:WBAT Follow-up:in 2 weeks Disposition - Home Discharged Condition: good   Discharge Orders    Future Orders Please Complete By Expires   Diet general      Call MD / Call 911      Comments:   If you experience chest pain or shortness of breath, CALL 911 and be transported to the hospital emergency room.  If you develope a fever above 101 F, pus (white drainage) or increased drainage or redness at the wound, or calf pain, call your surgeon's office.   Discharge instructions      Comments:   Pick up stool softner and laxative for home. Do not submerge incision under water. May shower. Continue to use ice for pain and swelling from surgery.  Take Xarelto for two and a half more weeks, then discontinue Xarelto.   Constipation Prevention      Comments:   Drink plenty of fluids.  Prune juice may be helpful.  You may use a stool softener, such as Colace (over the counter) 100 mg twice a day.  Use MiraLax (over the counter) for constipation as needed.   Increase activity slowly as tolerated      Patient may shower      Comments:   You may shower without a dressing once there is no drainage.  Do not wash over the wound.  If  drainage remains, do not shower until drainage stops.   Weight bearing as tolerated      Driving restrictions      Comments:   No driving until released by the physician.   Lifting restrictions      Comments:   No lifting until released by the physician.   TED hose      Comments:   Use stockings (TED hose) for 3 weeks on both leg(s).  You may remove them at night for sleeping.   Change dressing      Comments:   Change dressing daily with sterile 4 x 4 inch gauze dressing and apply TED hose. Do not submerge the incision under water.   Do not put a pillow under the knee. Place it under the heel.      Do not sit on low chairs, stoools or toilet seats, as it may be difficult to get up from low surfaces          Medication List     As of 02/01/2012  8:31 AM    STOP taking these medications         carisoprodol 350 MG tablet   Commonly known as: SOMA      diclofenac 75 MG EC tablet   Commonly known as: VOLTAREN      HYDROcodone-acetaminophen 5-325 MG per tablet   Commonly known as: NORCO/VICODIN      TAKE these medications         cetirizine 10 MG tablet   Commonly known as: ZYRTEC   Take 10 mg by mouth daily.      diphenhydrAMINE 25 mg capsule   Commonly known as: BENADRYL   Take 25 mg by mouth every 6 (six) hours as needed. Allergy      hydrocortisone 5 MG tablet   Commonly known as: CORTEF   Take 5 mg by mouth daily. Stated  She was never taking this medication      HYDROmorphone 2 MG tablet   Commonly known as: DILAUDID   Take 1-2 tablets (2-4 mg total) by mouth every 4 (four) hours as needed.      methocarbamol 500 MG tablet   Commonly known as: ROBAXIN   Take 1 tablet (500 mg total) by mouth every 6 (six) hours as needed.      promethazine 25 MG tablet   Commonly known as: PHENERGAN   Take 25 mg by mouth every 6 (six) hours as needed. Nausea      rivaroxaban 10 MG Tabs tablet   Commonly known as: XARELTO   Take 1 tablet (10 mg total) by mouth daily with  breakfast. Take Xarelto for two and a half more weeks, then discontinue Xarelto.           Follow-up Information    Follow up with Loanne Drilling, MD. Schedule an appointment as soon as possible for a visit in 2 weeks.   Contact information:   [address], [address] [place] [address] [phone]          Signed: Patrica Duel 02/01/2012, 8:31 AM

## 2012-02-01 NOTE — Progress Notes (Signed)
Occupational Therapy Treatment Patient Details Name: Holly [name] MRN: [id number] DOB: 1955/07/20 Today's Date: 02/01/2012 Time: 4098-1191 OT Time Calculation (min): 25 min  OT Assessment / Plan / Recommendation Comments on Treatment Session Pt is s/p L TKA and is doing well and is supposed to discharge today.     Follow Up Recommendations  Home health OT    Barriers to Discharge       Equipment Recommendations  None recommended by OT    Recommendations for Other Services    Frequency Min 2X/week   Plan Discharge plan remains appropriate    Precautions / Restrictions Precautions Precautions: Knee Required Braces or Orthoses: Knee Immobilizer - Left Knee Immobilizer - Left: Discontinue once straight leg raise with < 10 degree lag Restrictions Weight Bearing Restrictions: No        ADL  Toileting - Clothing Manipulation and Hygiene: Performed;Min guard;Other (comment) (pt standing with nursing tech for hygiene when OT arrived) Where Assessed - Toileting Clothing Manipulation and Hygiene: Standing Tub/Shower Transfer: Simulated;Min guard Tub/Shower Transfer Method: Other (comment) (simulate step back over ledge) Equipment Used: Rolling walker ADL Comments: Discussed use of 3in1 as shower chair for home. Pt up with nursing tech in bathroom when OT arrived. She didnt have KI on. Discussed need for KI until able to SLR. Donned KI for futher activity performed. Pt states she has a friend that is helping with ADL. Demonstrated AE versus having assist to pt able to reach to feet. She states her friend can help till she can reach feet.     OT Diagnosis:    OT Problem List:   OT Treatment Interventions:     OT Goals ADL Goals ADL Goal: Toilet Transfer - Progress: Progressing toward goals ADL Goal: Toileting - Hygiene - Progress: Progressing toward goals ADL Goal: Tub/Shower Transfer - Progress: Progressing toward goals  Visit Information  Last OT Received On:  02/01/12 Assistance Needed: +1    Subjective Data  Subjective: I need to call and have my ride come and get me Patient Stated Goal: home   Prior Functioning       Cognition  Overall Cognitive Status: Appears within functional limits for tasks assessed/performed Arousal/Alertness: Awake/alert Orientation Level: Appears intact for tasks assessed Behavior During Session: [facility] for tasks performed    Mobility  Shoulder Instructions Transfers Transfers: Sit to Stand;Stand to Sit Sit to Stand: 4: Min guard;With upper extremity assist;From chair/3-in-1 Stand to Sit: 4: Min guard;With upper extremity assist;To chair/3-in-1 Details for Transfer Assistance: min verbal cues       Exercises      Balance     End of Session OT - End of Session Activity Tolerance: Patient tolerated treatment well Patient left: in chair;with call bell/phone within reach  GO     Holly [name] [phone] 02/01/2012, 10:45 AM

## 2012-02-01 NOTE — Progress Notes (Signed)
Physical Therapy Treatment Patient Details Name: Holly [name] MRN: [id number] DOB: 09-Oct-1955 Today's Date: 02/01/2012 Time: 8657-8469 PT Time Calculation (min): 16 min  PT Assessment / Plan / Recommendation Comments on Treatment Session  Reviewed don/doff KI with pt    Follow Up Recommendations  Home health PT     Does the patient have the potential to tolerate intense rehabilitation     Barriers to Discharge        Equipment Recommendations  None recommended by OT    Recommendations for Other Services OT consult  Frequency 7X/week   Plan Discharge plan remains appropriate    Precautions / Restrictions Precautions Precautions: Knee Required Braces or Orthoses: Knee Immobilizer - Left Knee Immobilizer - Left: Discontinue once straight leg raise with < 10 degree lag Restrictions Weight Bearing Restrictions: No   Pertinent Vitals/Pain     Mobility  Bed Mobility Details for Bed Mobility Assistance: cues for sequence with assist for L LE Transfers Transfers: Sit to Stand;Stand to Sit Sit to Stand: 5: Supervision Stand to Sit: 5: Supervision Details for Transfer Assistance: min cues for use of UEs and for LE management Ambulation/Gait Ambulation/Gait Assistance: 4: Min guard;5: Supervision Ambulation Distance (Feet): 100 Feet Assistive device: Rolling walker Ambulation/Gait Assistance Details: cues for sequence and position from RW Gait Pattern: Step-to pattern Stairs: Yes Stairs Assistance: 4: Min assist Stairs Assistance Details (indicate cue type and reason): cues for sequence and foot/crutch placement Stair Management Technique: No rails;One rail Left;Step to pattern;Backwards;Forwards;With crutches;With walker Number of Stairs: 4  (4 steps fwd with crutch/rail; 1 bkwd with rw)    Exercises Total Joint Exercises Ankle Circles/Pumps: AROM;Both;Supine;20 reps Quad Sets: AROM;Supine;Both;20 reps Heel Slides: AROM;Supine;Left;20 reps Straight Leg Raises:  AAROM;20 reps;Supine;Left   PT Diagnosis:    PT Problem List:   PT Treatment Interventions:     PT Goals Acute Rehab PT Goals PT Goal Formulation: With patient Time For Goal Achievement: 02/04/12 Potential to Achieve Goals: Good Pt will go Supine/Side to Sit: with supervision PT Goal: Supine/Side to Sit - Progress: Progressing toward goal Pt will go Sit to Supine/Side: with supervision PT Goal: Sit to Supine/Side - Progress: Progressing toward goal Pt will go Sit to Stand: with supervision PT Goal: Sit to Stand - Progress: Progressing toward goal Pt will go Stand to Sit: with supervision PT Goal: Stand to Sit - Progress: Progressing toward goal Pt will Ambulate: 51 - 150 feet;with supervision;with rolling walker PT Goal: Ambulate - Progress: Progressing toward goal Pt will Go Up / Down Stairs: 3-5 stairs;with min assist;with least restrictive assistive device PT Goal: Up/Down Stairs - Progress: Met  Visit Information  Last PT Received On: 02/01/12 Assistance Needed: +1    Subjective Data  Subjective: I'm going home today Patient Stated Goal: Resume previous lifestyle with decreased pain   Cognition  Overall Cognitive Status: Appears within functional limits for tasks assessed/performed Arousal/Alertness: Awake/alert Orientation Level: Appears intact for tasks assessed Behavior During Session: [facility] for tasks performed    Balance     End of Session PT - End of Session Equipment Utilized During Treatment: Left knee immobilizer Activity Tolerance: Patient tolerated treatment well;Patient limited by fatigue Patient left: in chair;with call bell/phone within reach Nurse Communication: Mobility status   GP     Aela Bohan 02/01/2012, 12:06 PM

## 2012-02-01 NOTE — Progress Notes (Signed)
Discharge summary sent to payer through [facility]  

## 2012-02-01 NOTE — Progress Notes (Signed)
   Subjective: 2 Days Post-Op Procedure(s) (LRB): TOTAL KNEE ARTHROPLASTY (Left) Patient reports pain as mild.   Patient seen in rounds for Dr. Lequita Halt. Patient is well, and has had no acute complaints or problems Patient is ready to go home today.  Objective: Vital signs in last 24 hours: Temp:  [97.4 F (36.3 C)-99.1 F (37.3 C)] 98.1 F (36.7 C) (11/06 0610) Pulse Rate:  [66-113] 103  (11/06 0610) Resp:  [16-17] 17  (11/06 0754) BP: (113-161)/(80-97) 113/80 mmHg (11/06 0610) SpO2:  [96 %-100 %] 96 % (11/06 0610)  Intake/Output from previous day:  Intake/Output Summary (Last 24 hours) at 02/01/12 0827 Last data filed at 02/01/12 0700  Gross per 24 hour  Intake 988.08 ml  Output   3576 ml  Net -2587.92 ml    Intake/Output this shift:    Labs:  Basename 02/01/12 0443 01/31/12 0445  HGB 12.8 12.4    Basename 02/01/12 0443 01/31/12 0445  WBC 12.7* 11.0*  RBC 4.25 4.15  HCT 37.5 37.1  PLT 370 306    Basename 02/01/12 0443 01/31/12 0445  NA 132* 129*  K 4.0 4.0  CL 97 98  CO2 24 22  BUN 9 11  CREATININE 0.63 0.50  GLUCOSE 126* 119*  CALCIUM 9.3 8.7   No results found for this basename: LABPT:2,INR:2 in the last 72 hours  EXAM: General - Patient is Alert, Appropriate and Oriented Extremity - Neurovascular intact Sensation intact distally Dorsiflexion/Plantar flexion intact No cellulitis present Incision - clean, dry, no drainage, healing Motor Function - intact, moving foot and toes well on exam.   Assessment/Plan: 2 Days Post-Op Procedure(s) (LRB): TOTAL KNEE ARTHROPLASTY (Left) Procedure(s) (LRB): TOTAL KNEE ARTHROPLASTY (Left) Past Medical History  Diagnosis Date  . Sinus problem   . Arthritis     knees  . Hay fever    Principal Problem:  *OA (osteoarthritis) of knee Active Problems:  Postop Hyponatremia  Estimated Body mass index is 28.73 kg/(m^2) as calculated from the following:   Height as of this encounter: 5\' 6" (1.676 m).  Weight as of this encounter: 178 lb(80.74 kg). Discharge home with home health Diet - Regular diet Follow up - in 2 weeks Activity - WBAT Disposition - Home Condition Upon Discharge - Good D/C Meds - See DC Summary DVT Prophylaxis - Xarelto  Woodrow Dulski 02/01/2012, 8:27 AM

## 2012-02-01 NOTE — Progress Notes (Signed)
Pt to d/c home. AVS reviewed and "My Chart" discussed with pt. Pt capable of verbalizing medications and follow-up appointments with Dr. Lequita Halt. Remains hemodynamically stable. No signs and symptoms of distress. Educated pt to return to ER in the case of SOB, dizziness, or chest pain.

## 2012-03-30 ENCOUNTER — Other Ambulatory Visit: Payer: Self-pay | Admitting: Orthopedic Surgery

## 2012-03-30 ENCOUNTER — Encounter (HOSPITAL_COMMUNITY): Payer: Self-pay | Admitting: Pharmacy Technician

## 2012-03-30 ENCOUNTER — Encounter (HOSPITAL_COMMUNITY): Payer: Self-pay | Admitting: *Deleted

## 2012-03-30 MED ORDER — DEXAMETHASONE SODIUM PHOSPHATE 10 MG/ML IJ SOLN: 10.0000 mg | Freq: Once | INTRAMUSCULAR | Status: DC

## 2012-03-30 NOTE — Progress Notes (Signed)
03-30-12 1230 Need Md order entry in Epic. W. Kennon Portela

## 2012-03-30 NOTE — Progress Notes (Signed)
Preoperative surgical orders have been place into the Epic hospital system for Holly [name] on 03/30/2012, 1:38 PM  by Patrica Duel for surgery on 04/02/2012.  Preop Knee orders including IV Tylenol and IV Decadron as long as there are no contraindications to the above medications. Avel Peace, PA-C

## 2012-03-30 NOTE — Progress Notes (Signed)
03-30-12 1225 Instructed as SDS. Instructed on Hibiclens soap shower as preparing for surgery. Teach back method used. W. Kennon Portela

## 2012-04-01 NOTE — H&P (Signed)
  CC- Holly [name] is a 57 y.o. female who presents with left knee pain.  HPI- . Knee Pain: Patient presents with stiffness involving the  left knee. Onset of the symptoms was several weeks ago. Inciting event: total knee arthroplasty. Current symptoms include stiffness. Pain is aggravated by attempted range of motion.  Patient has had prior knee problems.She has had appropriate physical therapy and has not progressed at all with her range of motion, now flexing only 70 degrees and with a firm endpoint. She presents now for closed manipulation.  Past Medical History  Diagnosis Date  . Sinus problem   . Arthritis     knees  . Hay fever     Past Surgical History  Procedure Date  . Abdominal hysterectomy 2003    partial  . Tonsillectomy     age 80  . Total knee arthroplasty 01/30/2012    Procedure: TOTAL KNEE ARTHROPLASTY;  Surgeon: Loanne Drilling, MD;  Location: [facility];  Service: Orthopedics;  Laterality: Left;  . Foot surgery     bilateral -bone removal    Prior to Admission medications   Medication Sig Start Date End Date Taking? Authorizing Provider  aspirin 81 MG tablet Take 81 mg by mouth every morning.    Yes Historical Provider, MD  cetirizine (ZYRTEC) 10 MG tablet Take 10 mg by mouth at bedtime.    Yes Historical Provider, MD  HYDROcodone-acetaminophen (NORCO) 7.5-325 MG per tablet Take 1 tablet by mouth every 6 (six) hours as needed. For pain.   Yes Historical Provider, MD  diphenhydrAMINE (BENADRYL) 25 mg capsule Take 25 mg by mouth every 6 (six) hours as needed. For allergies.    Historical Provider, MD  methocarbamol (ROBAXIN) 500 MG tablet Take 500 mg by mouth every 6 (six) hours as needed. For muscle spasms. 02/01/12   Alexzandrew Julien Girt, PA  promethazine (PHENERGAN) 25 MG tablet Take 25 mg by mouth every 6 (six) hours as needed. For nausea.    Historical Provider, MD  rivaroxaban (XARELTO) 10 MG TABS tablet Take 10 mg by mouth daily with breakfast. 02/01/12   Alexzandrew  Julien Girt, PA   KNEE EXAM antalgic gait, reduced range of motion, collateral ligaments intact, Range of motion 10-70 degrees  Physical Examination: General appearance - alert, well appearing, and in no distress Mental status - alert, oriented to person, place, and time Chest - clear to auscultation, no wheezes, rales or rhonchi, symmetric air entry Heart - normal rate, regular rhythm, normal S1, S2, no murmurs, rubs, clicks or gallops Abdomen - soft, nontender, nondistended, no masses or organomegaly Neurological - alert, oriented, normal speech, no focal findings or movement disorder noted   Asessment/Plan--- Left knee arthrofibrosis- - Plan left knee closed manipulation. Procedure risks and potential comps discussed with patient who elects to proceed. Goals are decreased pain and increased function with a high likelihood of achieving both

## 2012-04-02 ENCOUNTER — Encounter (HOSPITAL_COMMUNITY)
Admission: RE | Disposition: A | Discharge status: Home / Self Care | Payer: Self-pay | Source: Ambulatory Visit | Attending: Orthopedic Surgery

## 2012-04-02 ENCOUNTER — Ambulatory Visit (HOSPITAL_COMMUNITY)
Admission: RE | Admit: 2012-04-02 | Discharge: 2012-04-02 | Disposition: A | Discharge status: Home / Self Care | Payer: BC Managed Care – PPO | Source: Ambulatory Visit | Attending: Orthopedic Surgery | Admitting: Orthopedic Surgery

## 2012-04-02 ENCOUNTER — Encounter (HOSPITAL_COMMUNITY): Payer: Self-pay | Admitting: Anesthesiology

## 2012-04-02 ENCOUNTER — Ambulatory Visit (HOSPITAL_COMMUNITY): Payer: BC Managed Care – PPO | Admitting: Anesthesiology

## 2012-04-02 DIAGNOSIS — M24669 Ankylosis, unspecified knee: Secondary | ICD-10-CM | POA: Insufficient documentation

## 2012-04-02 DIAGNOSIS — M25669 Stiffness of unspecified knee, not elsewhere classified: Secondary | ICD-10-CM | POA: Diagnosis present

## 2012-04-02 DIAGNOSIS — T8489XA Other specified complication of internal orthopedic prosthetic devices, implants and grafts, initial encounter: Secondary | ICD-10-CM

## 2012-04-02 DIAGNOSIS — Z7982 Long term (current) use of aspirin: Secondary | ICD-10-CM | POA: Insufficient documentation

## 2012-04-02 DIAGNOSIS — Z96659 Presence of unspecified artificial knee joint: Secondary | ICD-10-CM | POA: Insufficient documentation

## 2012-04-02 DIAGNOSIS — Z79899 Other long term (current) drug therapy: Secondary | ICD-10-CM | POA: Insufficient documentation

## 2012-04-02 HISTORY — PX: KNEE CLOSED REDUCTION: SHX995

## 2012-04-02 LAB — SURGICAL PCR SCREEN
MRSA, PCR: POSITIVE — AB
Staphylococcus aureus: POSITIVE — AB

## 2012-04-02 LAB — CBC
Hemoglobin: 14.8 g/dL (ref 12.0–15.0)
MCH: 30.6 pg (ref 26.0–34.0)
MCHC: 33.9 g/dL (ref 30.0–36.0)
Platelets: 345 10*3/uL (ref 150–400)
RBC: 4.84 MIL/uL (ref 3.87–5.11)

## 2012-04-02 SURGERY — MANIPULATION, KNEE, CLOSED
Anesthesia: Monitor Anesthesia Care | Site: Knee | Laterality: Left

## 2012-04-02 MED ORDER — HYDROCODONE-ACETAMINOPHEN 7.5-325 MG PO TABS
1.0000 | ORAL_TABLET | Freq: Four times a day (QID) | ORAL | Status: DC | PRN
  Administered 2012-04-02: 1 via ORAL
  Filled 2012-04-02: qty 1

## 2012-04-02 MED ORDER — LACTATED RINGERS IV SOLN: INTRAVENOUS | Status: DC

## 2012-04-02 MED ORDER — PROPOFOL 10 MG/ML IV BOLUS
INTRAVENOUS | Status: DC | PRN
  Administered 2012-04-02: 150 mg via INTRAVENOUS

## 2012-04-02 MED ORDER — ACETAMINOPHEN 10 MG/ML IV SOLN
1000.0000 mg | Freq: Once | INTRAVENOUS | Status: AC
  Administered 2012-04-02: 1000 mg via INTRAVENOUS

## 2012-04-02 MED ORDER — MIDAZOLAM HCL 5 MG/5ML IJ SOLN
INTRAMUSCULAR | Status: DC | PRN
  Administered 2012-04-02: 2 mg via INTRAVENOUS

## 2012-04-02 MED ORDER — FENTANYL CITRATE 0.05 MG/ML IJ SOLN
25.0000 ug | INTRAMUSCULAR | Status: DC | PRN
  Administered 2012-04-02 (×2): 50 ug via INTRAVENOUS

## 2012-04-02 MED ORDER — ACETAMINOPHEN 10 MG/ML IV SOLN
INTRAVENOUS | Status: AC
  Filled 2012-04-02: qty 100

## 2012-04-02 MED ORDER — FENTANYL CITRATE 0.05 MG/ML IJ SOLN
INTRAMUSCULAR | Status: AC
  Filled 2012-04-02: qty 2

## 2012-04-02 MED ORDER — MUPIROCIN 2 % EX OINT
TOPICAL_OINTMENT | Freq: Two times a day (BID) | CUTANEOUS | Status: DC
Start: 2012-04-02 — End: 2012-04-02

## 2012-04-02 MED ORDER — SODIUM CHLORIDE 0.9 % IV SOLN
INTRAVENOUS | Status: DC
  Administered 2012-04-02: 16:00:00 via INTRAVENOUS

## 2012-04-02 MED ORDER — HYDROCODONE-ACETAMINOPHEN 7.5-325 MG PO TABS: 1.0000 | ORAL_TABLET | Freq: Four times a day (QID) | ORAL | Status: DC | PRN

## 2012-04-02 MED ORDER — LIDOCAINE HCL (CARDIAC) 20 MG/ML IV SOLN
INTRAVENOUS | Status: DC | PRN
  Administered 2012-04-02: 50 mg via INTRAVENOUS

## 2012-04-02 MED ORDER — FENTANYL CITRATE 0.05 MG/ML IJ SOLN
INTRAMUSCULAR | Status: DC | PRN
  Administered 2012-04-02: 50 ug via INTRAVENOUS

## 2012-04-02 MED ORDER — MUPIROCIN 2 % EX OINT
TOPICAL_OINTMENT | CUTANEOUS | Status: AC
  Administered 2012-04-02: 15:00:00
  Filled 2012-04-02: qty 22

## 2012-04-02 NOTE — Anesthesia Postprocedure Evaluation (Signed)
  Anesthesia Post-op Note  Patient: Holly [name]  Procedure(s) Performed: Procedure(s) (LRB): CLOSED MANIPULATION KNEE (Left)  Patient Location: PACU  Anesthesia Type: General  Level of Consciousness: awake and alert   Airway and Oxygen Therapy: Patient Spontanous Breathing  Post-op Pain: mild  Post-op Assessment: Post-op Vital signs reviewed, Patient's Cardiovascular Status Stable, Respiratory Function Stable, Patent Airway and No signs of Nausea or vomiting  Last Vitals:  Filed Vitals:   04/02/12 1827  BP: 142/88  Pulse: 88  Temp: 36.2 C  Resp: 16    Post-op Vital Signs: stable   Complications: No apparent anesthesia complications

## 2012-04-02 NOTE — Anesthesia Preprocedure Evaluation (Addendum)
Anesthesia Evaluation  Patient identified by MRN, date of birth, ID band Patient awake    Reviewed: Allergy & Precautions, H&P , NPO status , Patient's Chart, lab work & pertinent test results  Airway Mallampati: II TM Distance: >3 FB Neck ROM: full    Dental No notable dental hx. (+) Teeth Intact and Dental Advisory Given   Pulmonary Current Smoker,  breath sounds clear to auscultation  Pulmonary exam normal       Cardiovascular Exercise Tolerance: Good negative cardio ROS  Rhythm:regular Rate:Normal     Neuro/Psych negative neurological ROS  negative psych ROS   GI/Hepatic negative GI ROS, Neg liver ROS,   Endo/Other  negative endocrine ROS  Renal/GU negative Renal ROS  negative genitourinary   Musculoskeletal   Abdominal   Peds  Hematology negative hematology ROS (+)   Anesthesia Other Findings   Reproductive/Obstetrics negative OB ROS                           Anesthesia Physical  Anesthesia Plan  ASA: II  Anesthesia Plan: MAC and General   Post-op Pain Management:    Induction:   Airway Management Planned: Mask and LMA  Additional Equipment:   Intra-op Plan:   Post-operative Plan:   Informed Consent: I have reviewed the patients History and Physical, chart, labs and discussed the procedure including the risks, benefits and alternatives for the proposed anesthesia with the patient or authorized representative who has indicated his/her understanding and acceptance.   Dental Advisory Given  Plan Discussed with: CRNA and Surgeon  Anesthesia Plan Comments:        Anesthesia Quick Evaluation

## 2012-04-02 NOTE — Transfer of Care (Signed)
Immediate Anesthesia Transfer of Care Note  Patient: Holly [name]  Procedure(s) Performed: Procedure(s) (LRB) with comments: CLOSED MANIPULATION KNEE (Left)  Patient Location: PACU  Anesthesia Type:General  Level of Consciousness: sedated  Airway & Oxygen Therapy: Patient Spontanous Breathing and Patient connected to face mask oxygen  Post-op Assessment: Report given to PACU RN and Post -op Vital signs reviewed and stable  Post vital signs: Reviewed and stable  Complications: No apparent anesthesia complications

## 2012-04-02 NOTE — Op Note (Signed)
  OPERATIVE REPORT   PREOPERATIVE DIAGNOSIS: Arthrofibrosis, Left  knee.   POSTOPERATIVE DIAGNOSIS: Arthrofibrosis, Left knee.   PROCEDURE:  Left  knee closed manipulation.   SURGEON: Ollen Gross, MD   ASSISTANT: None.   ANESTHESIA: General.   COMPLICATIONS: None.   CONDITION: Stable to Recovery.   Pre-manipulation range of motion is 10-70.  Post-manipulation range of  Motion is 5-120  PROCEDURE IN DETAIL: After successful administration of general  anesthetic, exam under anesthesia was performed showing range of motion  10-70 degrees. I then placed my chest against the proximal tibia,  flexing the knee with audible lysis of adhesions. I was easily able to  get the knee flexed to 120  degrees. I then put the knee back in extension and with some  patellar manipulation and gentle pressure got within 5 degrees of full  Extension.The patient was subsequently awakened and transported to Recovery in  stable condition.

## 2012-04-02 NOTE — Interval H&P Note (Signed)
History and Physical Interval Note:  04/02/2012 4:33 PM  Holly [name]  has presented today for surgery, with the diagnosis of left knee arthrofibrosis  The various methods of treatment have been discussed with the patient and family. After consideration of risks, benefits and other options for treatment, the patient has consented to  Procedure(s) (LRB) with comments: CLOSED MANIPULATION KNEE (Left) as a surgical intervention .  The patient's history has been reviewed, patient examined, no change in status, stable for surgery.  I have reviewed the patient's chart and labs.  Questions were answered to the patient's satisfaction.     Loanne Drilling

## 2012-04-03 ENCOUNTER — Encounter (HOSPITAL_COMMUNITY): Payer: Self-pay | Admitting: Orthopedic Surgery

## 2013-03-28 ENCOUNTER — Encounter (HOSPITAL_COMMUNITY): Payer: Self-pay | Admitting: Emergency Medicine

## 2013-03-28 DIAGNOSIS — Z7982 Long term (current) use of aspirin: Secondary | ICD-10-CM | POA: Insufficient documentation

## 2013-03-28 DIAGNOSIS — J111 Influenza due to unidentified influenza virus with other respiratory manifestations: Secondary | ICD-10-CM | POA: Insufficient documentation

## 2013-03-28 DIAGNOSIS — Z87891 Personal history of nicotine dependence: Secondary | ICD-10-CM | POA: Insufficient documentation

## 2013-03-28 DIAGNOSIS — Z9089 Acquired absence of other organs: Secondary | ICD-10-CM | POA: Insufficient documentation

## 2013-03-28 DIAGNOSIS — IMO0002 Reserved for concepts with insufficient information to code with codable children: Secondary | ICD-10-CM

## 2013-03-28 DIAGNOSIS — R52 Pain, unspecified: Secondary | ICD-10-CM | POA: Insufficient documentation

## 2013-03-28 DIAGNOSIS — J029 Acute pharyngitis, unspecified: Secondary | ICD-10-CM | POA: Insufficient documentation

## 2013-03-28 DIAGNOSIS — M1710 Unilateral primary osteoarthritis, unspecified knee: Secondary | ICD-10-CM | POA: Insufficient documentation

## 2013-03-28 NOTE — ED Notes (Signed)
Fever, Chills, Body aches, sore throat, and cough per pt.

## 2013-03-29 ENCOUNTER — Emergency Department (HOSPITAL_COMMUNITY): Payer: BC Managed Care – PPO

## 2013-03-29 ENCOUNTER — Emergency Department (HOSPITAL_COMMUNITY)
Admission: EM | Admit: 2013-03-29 | Discharge: 2013-03-29 | Disposition: A | Discharge status: Home / Self Care | Payer: BC Managed Care – PPO | Attending: Emergency Medicine | Admitting: Emergency Medicine

## 2013-03-29 DIAGNOSIS — R69 Illness, unspecified: Secondary | ICD-10-CM

## 2013-03-29 DIAGNOSIS — J111 Influenza due to unidentified influenza virus with other respiratory manifestations: Secondary | ICD-10-CM

## 2013-03-29 LAB — CBC WITH DIFFERENTIAL/PLATELET
BASOS ABS: 0 10*3/uL (ref 0.0–0.1)
Basophils Relative: 0 % (ref 0–1)
EOS PCT: 4 % (ref 0–5)
Eosinophils Absolute: 0.2 10*3/uL (ref 0.0–0.7)
HCT: 42.1 % (ref 36.0–46.0)
Hemoglobin: 14 g/dL (ref 12.0–15.0)
LYMPHS ABS: 0.7 10*3/uL (ref 0.7–4.0)
LYMPHS PCT: 14 % (ref 12–46)
MCH: 30.4 pg (ref 26.0–34.0)
MCHC: 33.3 g/dL (ref 30.0–36.0)
MCV: 91.3 fL (ref 78.0–100.0)
Monocytes Absolute: 0.4 10*3/uL (ref 0.1–1.0)
Monocytes Relative: 7 % (ref 3–12)
NEUTROS ABS: 3.7 10*3/uL (ref 1.7–7.7)
Neutrophils Relative %: 75 % (ref 43–77)
PLATELETS: 278 10*3/uL (ref 150–400)
RBC: 4.61 MIL/uL (ref 3.87–5.11)
RDW: 13.3 % (ref 11.5–15.5)
WBC: 5 10*3/uL (ref 4.0–10.5)

## 2013-03-29 MED ORDER — OSELTAMIVIR PHOSPHATE 75 MG PO CAPS: 75.0000 mg | ORAL_CAPSULE | Freq: Two times a day (BID) | ORAL | Status: DC

## 2013-03-29 MED ORDER — ALBUTEROL (5 MG/ML) CONTINUOUS INHALATION SOLN
10.0000 mg | INHALATION_SOLUTION | RESPIRATORY_TRACT | Status: AC
  Administered 2013-03-29: 10 mg via RESPIRATORY_TRACT

## 2013-03-29 MED ORDER — KETOROLAC TROMETHAMINE 30 MG/ML IJ SOLN
30.0000 mg | Freq: Once | INTRAMUSCULAR | Status: AC
  Administered 2013-03-29: 30 mg via INTRAVENOUS
  Filled 2013-03-29: qty 1

## 2013-03-29 MED ORDER — ALBUTEROL SULFATE HFA 108 (90 BASE) MCG/ACT IN AERS
INHALATION_SPRAY | RESPIRATORY_TRACT | Status: AC
  Filled 2013-03-29: qty 6.7

## 2013-03-29 MED ORDER — ALBUTEROL SULFATE HFA 108 (90 BASE) MCG/ACT IN AERS
2.0000 | INHALATION_SPRAY | Freq: Once | RESPIRATORY_TRACT | Status: AC
  Administered 2013-03-29: 03:00:00 via RESPIRATORY_TRACT

## 2013-03-29 MED ORDER — NAPROXEN 500 MG PO TABS: 500.0000 mg | ORAL_TABLET | Freq: Two times a day (BID) | ORAL | Status: DC

## 2013-03-29 NOTE — ED Provider Notes (Signed)
CSN: [id number]     Arrival date & time 03/28/13  2212 History   First MD Initiated Contact with Patient 03/29/13 0030     Chief Complaint  Patient presents with  . Cough  . Sore Throat  . Generalized Body Aches   (Consider location/radiation/quality/duration/timing/severity/associated sxs/prior Treatment) HPI Comments: 58 year old female with a history of no significant medical problems who presents with a complaint of 24 hours of body aches. She had acute onset of this this morning when she awoke, she has had persistent body aches, alternating fevers and chills as well as a sore throat and a nonproductive cough. She feels as though she is wheezing. Nothing seems to make this better or worse, no associated vomiting or diarrhea or rashes. She has a sick contact with her spouse who has had similar symptoms. She denies getting vaccinations this season  Patient is a 58 y.o. female presenting with cough and pharyngitis. The history is provided by the patient and the spouse.  Cough Sore Throat    Past Medical History  Diagnosis Date  . Sinus problem   . Arthritis     knees  . Hay fever    Past Surgical History  Procedure Laterality Date  . Abdominal hysterectomy  2003    partial  . Tonsillectomy      age 89  . Total knee arthroplasty  01/30/2012    Procedure: TOTAL KNEE ARTHROPLASTY;  Surgeon: Gearlean Alf, MD;  Location: [facility];  Service: Orthopedics;  Laterality: Left;  . Foot surgery      bilateral -bone removal  . Knee closed reduction  04/02/2012    Procedure: CLOSED MANIPULATION KNEE;  Surgeon: Gearlean Alf, MD;  Location: [facility];  Service: Orthopedics;  Laterality: Left;   History reviewed. No pertinent family history. History  Substance Use Topics  . Smoking status: Former Smoker -- 0.50 packs/day for 40 years    Types: Cigarettes    Quit date: 01/28/2012  . Smokeless tobacco: Never Used  . Alcohol Use: No   OB History   Grav Para Term Preterm Abortions TAB SAB Ect  Mult Living                 Review of Systems  Respiratory: Positive for cough.   All other systems reviewed and are negative.    Allergies  Dilaudid; Codeine; and Morphine and related  Home Medications   Current Outpatient Rx  Name  Route  Sig  Dispense  Refill  . aspirin 81 MG tablet   Oral   Take 81 mg by mouth every morning.          . cetirizine (ZYRTEC) 10 MG tablet   Oral   Take 10 mg by mouth at bedtime.          . diphenhydrAMINE (BENADRYL) 25 mg capsule   Oral   Take 25 mg by mouth every 6 (six) hours as needed. For allergies.         Marland Kitchen HYDROcodone-acetaminophen (NORCO) 7.5-325 MG per tablet   Oral   Take 1 tablet by mouth every 6 (six) hours as needed. For pain.         Marland Kitchen HYDROcodone-acetaminophen (NORCO) 7.5-325 MG per tablet   Oral   Take 1-2 tablets by mouth every 6 (six) hours as needed for pain.   60 tablet   1   . methocarbamol (ROBAXIN) 500 MG tablet   Oral   Take 500 mg by mouth every 6 (six) hours  as needed. For muscle spasms.         . promethazine (PHENERGAN) 25 MG tablet   Oral   Take 25 mg by mouth every 6 (six) hours as needed. For nausea.          Pulse 111  Temp(Src) 99 F (37.2 C) (Oral)  Resp 20  Ht 5\' 7"  (1.702 m)  Wt 185 lb (83.915 kg)  BMI 28.97 kg/m2  SpO2 95% Physical Exam  Nursing note and vitals reviewed. Constitutional: She appears well-developed and well-nourished.  HENT:  Head: Normocephalic and atraumatic.  Mouth/Throat: No oropharyngeal exudate.  Tympanic membranes clear bilaterally Oropharynx erythematous without exudate asymmetry or hypertrophy Nasal passages with swollen turbinates, no discharge  Eyes: Conjunctivae and EOM are normal. Pupils are equal, round, and reactive to light. Right eye exhibits no discharge. Left eye exhibits no discharge. No scleral icterus.  Neck: Normal range of motion. Neck supple. No JVD present. No thyromegaly present.  No lymphadenopathy, very supple neck with  normal range of motion  Cardiovascular: Normal rate, regular rhythm, normal heart sounds and intact distal pulses.  Exam reveals no gallop and no friction rub.   No murmur heard. No tachycardia when the patient is lying flat  Pulmonary/Chest: Effort normal. No respiratory distress. She has wheezes. She has no rales.  Wheezing in all lung fields, slight prolonged expiratory phase, no increased work of breathing or accessory muscle use.  Abdominal: Soft. Bowel sounds are normal. She exhibits no distension and no mass. There is no tenderness.  Musculoskeletal: Normal range of motion. She exhibits no edema and no tenderness.  Lymphadenopathy:    She has no cervical adenopathy.  Neurological: She is alert. Coordination normal.  Skin: Skin is warm and dry. No rash noted. No erythema.  Psychiatric: She has a normal mood and affect. Her behavior is normal.    ED Course  Procedures (including critical care time) Labs Review Labs Reviewed - No data to display Imaging Review No results found.  EKG Interpretation   None       MDM  No diagnosis found. The patient appears to have an acute respiratory illness, her oxygen saturation is 95% on room air, she is afebrile by oral temperature, will give nebulizer therapy, chest x-ray, rule out pneumonia as the patient has had pneumonia in the past. Despite this she has had myalgias, alternating fevers and chills consistent with a flulike illness.  Improved after med - stable for d/c.  Filed Vitals:   03/28/13 2239 03/29/13 0137 03/29/13 0139 03/29/13 0220  Pulse: 111     Temp: 99 F (37.2 C)     TempSrc: Oral     Resp: 20     Height: 5\' 7"  (1.702 m)     Weight: 185 lb (83.915 kg)     SpO2: 95% 95% 95% 99%     Meds given in ED:  Medications  albuterol (PROVENTIL,VENTOLIN) solution continuous neb (0 mg Nebulization Stopped 03/29/13 0313)  ketorolac (TORADOL) 30 MG/ML injection 30 mg (30 mg Intravenous Given 03/29/13 0048)  albuterol  (PROVENTIL HFA;VENTOLIN HFA) 108 (90 BASE) MCG/ACT inhaler 2 puff ( Inhalation Given 03/29/13 0313)    Discharge Medication List as of 03/29/2013  1:55 AM    START taking these medications   Details  naproxen (NAPROSYN) 500 MG tablet Take 1 tablet (500 mg total) by mouth 2 (two) times daily with a meal., Starting 03/29/2013, Until Discontinued, Print    oseltamivir (TAMIFLU) 75 MG capsule Take 1 capsule (  75 mg total) by mouth every 12 (twelve) hours., Starting 03/29/2013, Until Discontinued, Print          Johnna Acosta, MD 03/29/13 [phone]

## 2013-03-29 NOTE — Discharge Instructions (Signed)
Please call your doctor for a followup appointment within 24-48 hours. When you talk to your doctor please let them know that you were seen in the emergency department and have them acquire all of your records so that they can discuss the findings with you and formulate a treatment plan to fully care for your new and ongoing problems. ° °

## 2013-06-18 ENCOUNTER — Encounter (HOSPITAL_COMMUNITY): Payer: Self-pay | Admitting: Emergency Medicine

## 2013-06-18 DIAGNOSIS — E876 Hypokalemia: Secondary | ICD-10-CM | POA: Diagnosis present

## 2013-06-18 DIAGNOSIS — K5732 Diverticulitis of large intestine without perforation or abscess without bleeding: Principal | ICD-10-CM | POA: Diagnosis present

## 2013-06-18 DIAGNOSIS — E119 Type 2 diabetes mellitus without complications: Secondary | ICD-10-CM | POA: Diagnosis present

## 2013-06-18 DIAGNOSIS — K219 Gastro-esophageal reflux disease without esophagitis: Secondary | ICD-10-CM | POA: Diagnosis present

## 2013-06-18 NOTE — ED Notes (Signed)
Pt. reports left and low abdominal pain with nausea and vomitting onset yesterday morning . Denies urinary discomfort / no fever or chills.

## 2013-06-19 ENCOUNTER — Emergency Department (HOSPITAL_COMMUNITY): Payer: BC Managed Care – PPO

## 2013-06-19 ENCOUNTER — Encounter (HOSPITAL_COMMUNITY): Payer: Self-pay | Admitting: Radiology

## 2013-06-19 ENCOUNTER — Inpatient Hospital Stay (HOSPITAL_COMMUNITY)
Admission: EM | Admit: 2013-06-19 | Discharge: 2013-06-21 | DRG: 392 | Disposition: A | Discharge status: Home / Self Care | Payer: BC Managed Care – PPO | Attending: Internal Medicine | Admitting: Internal Medicine

## 2013-06-19 DIAGNOSIS — Z96659 Presence of unspecified artificial knee joint: Secondary | ICD-10-CM

## 2013-06-19 DIAGNOSIS — E871 Hypo-osmolality and hyponatremia: Secondary | ICD-10-CM

## 2013-06-19 DIAGNOSIS — R112 Nausea with vomiting, unspecified: Secondary | ICD-10-CM

## 2013-06-19 DIAGNOSIS — M1710 Unilateral primary osteoarthritis, unspecified knee: Secondary | ICD-10-CM

## 2013-06-19 DIAGNOSIS — K5732 Diverticulitis of large intestine without perforation or abscess without bleeding: Principal | ICD-10-CM

## 2013-06-19 DIAGNOSIS — T8489XA Other specified complication of internal orthopedic prosthetic devices, implants and grafts, initial encounter: Secondary | ICD-10-CM

## 2013-06-19 DIAGNOSIS — M179 Osteoarthritis of knee, unspecified: Secondary | ICD-10-CM

## 2013-06-19 DIAGNOSIS — E876 Hypokalemia: Secondary | ICD-10-CM

## 2013-06-19 DIAGNOSIS — K5792 Diverticulitis of intestine, part unspecified, without perforation or abscess without bleeding: Secondary | ICD-10-CM | POA: Diagnosis present

## 2013-06-19 DIAGNOSIS — R109 Unspecified abdominal pain: Secondary | ICD-10-CM

## 2013-06-19 DIAGNOSIS — M25669 Stiffness of unspecified knee, not elsewhere classified: Secondary | ICD-10-CM

## 2013-06-19 HISTORY — DX: Gastro-esophageal reflux disease without esophagitis: K21.9

## 2013-06-19 HISTORY — DX: Headache: R51

## 2013-06-19 HISTORY — DX: Diverticulitis of intestine, part unspecified, without perforation or abscess without bleeding: K57.92

## 2013-06-19 LAB — CBC WITH DIFFERENTIAL/PLATELET
Basophils Absolute: 0 10*3/uL (ref 0.0–0.1)
Basophils Relative: 0 % (ref 0–1)
EOS PCT: 1 % (ref 0–5)
Eosinophils Absolute: 0.1 10*3/uL (ref 0.0–0.7)
HEMATOCRIT: 45.4 % (ref 36.0–46.0)
HEMOGLOBIN: 15.5 g/dL — AB (ref 12.0–15.0)
LYMPHS ABS: 0.7 10*3/uL (ref 0.7–4.0)
LYMPHS PCT: 9 % — AB (ref 12–46)
MCH: 31.4 pg (ref 26.0–34.0)
MCHC: 34.1 g/dL (ref 30.0–36.0)
MCV: 92.1 fL (ref 78.0–100.0)
MONO ABS: 0.4 10*3/uL (ref 0.1–1.0)
MONOS PCT: 6 % (ref 3–12)
NEUTROS ABS: 6.6 10*3/uL (ref 1.7–7.7)
Neutrophils Relative %: 84 % — ABNORMAL HIGH (ref 43–77)
Platelets: 308 10*3/uL (ref 150–400)
RBC: 4.93 MIL/uL (ref 3.87–5.11)
RDW: 14.2 % (ref 11.5–15.5)
WBC: 7.8 10*3/uL (ref 4.0–10.5)

## 2013-06-19 LAB — COMPREHENSIVE METABOLIC PANEL
ALT: 22 U/L (ref 0–35)
AST: 37 U/L (ref 0–37)
Albumin: 4 g/dL (ref 3.5–5.2)
Alkaline Phosphatase: 123 U/L — ABNORMAL HIGH (ref 39–117)
BILIRUBIN TOTAL: 0.7 mg/dL (ref 0.3–1.2)
BUN: 17 mg/dL (ref 6–23)
CALCIUM: 9.7 mg/dL (ref 8.4–10.5)
CHLORIDE: 104 meq/L (ref 96–112)
CO2: 19 meq/L (ref 19–32)
CREATININE: 0.64 mg/dL (ref 0.50–1.10)
GLUCOSE: 111 mg/dL — AB (ref 70–99)
Potassium: 5.3 mEq/L (ref 3.7–5.3)
Sodium: 138 mEq/L (ref 137–147)
Total Protein: 8.2 g/dL (ref 6.0–8.3)

## 2013-06-19 LAB — HEMOGLOBIN A1C
Hgb A1c MFr Bld: 6 % — ABNORMAL HIGH (ref <5.7)
Mean Plasma Glucose: 126 mg/dL — ABNORMAL HIGH (ref <117)

## 2013-06-19 LAB — CBC
HEMATOCRIT: 40.8 % (ref 36.0–46.0)
Hemoglobin: 13.8 g/dL (ref 12.0–15.0)
MCH: 31.3 pg (ref 26.0–34.0)
MCHC: 33.8 g/dL (ref 30.0–36.0)
MCV: 92.5 fL (ref 78.0–100.0)
Platelets: 243 10*3/uL (ref 150–400)
RBC: 4.41 MIL/uL (ref 3.87–5.11)
RDW: 14.2 % (ref 11.5–15.5)
WBC: 5.4 10*3/uL (ref 4.0–10.5)

## 2013-06-19 LAB — URINALYSIS, ROUTINE W REFLEX MICROSCOPIC
Bilirubin Urine: NEGATIVE
GLUCOSE, UA: NEGATIVE mg/dL
HGB URINE DIPSTICK: NEGATIVE
KETONES UR: 15 mg/dL — AB
LEUKOCYTES UA: NEGATIVE
Nitrite: NEGATIVE
PROTEIN: NEGATIVE mg/dL
Specific Gravity, Urine: 1.031 — ABNORMAL HIGH (ref 1.005–1.030)
UROBILINOGEN UA: 0.2 mg/dL (ref 0.0–1.0)
pH: 5.5 (ref 5.0–8.0)

## 2013-06-19 LAB — CREATININE, SERUM
Creatinine, Ser: 0.69 mg/dL (ref 0.50–1.10)
GFR calc non Af Amer: >90 mL/min (ref >90)

## 2013-06-19 LAB — GLUCOSE, CAPILLARY
GLUCOSE-CAPILLARY: 99 mg/dL (ref 70–99)
Glucose-Capillary: 88 mg/dL (ref 70–99)

## 2013-06-19 LAB — MRSA PCR SCREENING: MRSA by PCR: POSITIVE — AB

## 2013-06-19 LAB — LIPASE, BLOOD: Lipase: 18 U/L (ref 11–59)

## 2013-06-19 MED ORDER — ONDANSETRON HCL 4 MG/2ML IJ SOLN
4.0000 mg | Freq: Once | INTRAMUSCULAR | Status: AC
  Administered 2013-06-19: 4 mg via INTRAVENOUS
  Filled 2013-06-19: qty 2

## 2013-06-19 MED ORDER — SODIUM CHLORIDE 0.9 % IV SOLN
1000.0000 mL | Freq: Once | INTRAVENOUS | Status: AC
  Administered 2013-06-19: 1000 mL via INTRAVENOUS

## 2013-06-19 MED ORDER — ASPIRIN 81 MG PO CHEW
81.0000 mg | CHEWABLE_TABLET | Freq: Every morning | ORAL | Status: DC
  Administered 2013-06-19 – 2013-06-21 (×3): 81 mg via ORAL
  Filled 2013-06-19 (×3): qty 1

## 2013-06-19 MED ORDER — LORATADINE 10 MG PO TABS
10.0000 mg | ORAL_TABLET | Freq: Every day | ORAL | Status: DC
  Administered 2013-06-19 – 2013-06-21 (×3): 10 mg via ORAL
  Filled 2013-06-19 (×3): qty 1

## 2013-06-19 MED ORDER — KETOROLAC TROMETHAMINE 15 MG/ML IJ SOLN
15.0000 mg | Freq: Once | INTRAMUSCULAR | Status: AC
  Administered 2013-06-19: 15 mg via INTRAVENOUS
  Filled 2013-06-19: qty 1

## 2013-06-19 MED ORDER — FENTANYL CITRATE 0.05 MG/ML IJ SOLN
50.0000 ug | Freq: Once | INTRAMUSCULAR | Status: AC
  Administered 2013-06-19: 50 ug via INTRAVENOUS
  Filled 2013-06-19: qty 2

## 2013-06-19 MED ORDER — CIPROFLOXACIN IN D5W 400 MG/200ML IV SOLN
400.0000 mg | Freq: Two times a day (BID) | INTRAVENOUS | Status: DC
  Administered 2013-06-19 – 2013-06-20 (×2): 400 mg via INTRAVENOUS
  Filled 2013-06-19 (×3): qty 200

## 2013-06-19 MED ORDER — MUPIROCIN 2 % EX OINT
1.0000 "application " | TOPICAL_OINTMENT | Freq: Two times a day (BID) | CUTANEOUS | Status: DC
  Administered 2013-06-19 – 2013-06-21 (×4): 1 via NASAL
  Filled 2013-06-19: qty 22

## 2013-06-19 MED ORDER — SODIUM CHLORIDE 0.9 % IV SOLN
INTRAVENOUS | Status: DC
  Administered 2013-06-19: 21:00:00 via INTRAVENOUS

## 2013-06-19 MED ORDER — KETOROLAC TROMETHAMINE 15 MG/ML IJ SOLN
15.0000 mg | Freq: Three times a day (TID) | INTRAMUSCULAR | Status: DC | PRN
  Administered 2013-06-19: 15 mg via INTRAVENOUS
  Filled 2013-06-19: qty 1

## 2013-06-19 MED ORDER — CIPROFLOXACIN IN D5W 400 MG/200ML IV SOLN
400.0000 mg | Freq: Once | INTRAVENOUS | Status: AC
  Administered 2013-06-19: 400 mg via INTRAVENOUS
  Filled 2013-06-19: qty 200

## 2013-06-19 MED ORDER — ONDANSETRON HCL 4 MG/2ML IJ SOLN
4.0000 mg | Freq: Three times a day (TID) | INTRAMUSCULAR | Status: DC | PRN
Start: 2013-06-19 — End: 2013-06-19

## 2013-06-19 MED ORDER — TETRAHYDROZOLINE HCL 0.05 % OP SOLN
1.0000 [drp] | Freq: Two times a day (BID) | OPHTHALMIC | Status: DC
  Administered 2013-06-19 – 2013-06-21 (×5): 1 [drp] via OPHTHALMIC
  Filled 2013-06-19: qty 15

## 2013-06-19 MED ORDER — ACETAMINOPHEN 650 MG RE SUPP: 650.0000 mg | Freq: Four times a day (QID) | RECTAL | Status: DC | PRN

## 2013-06-19 MED ORDER — DIPHENHYDRAMINE HCL 25 MG PO CAPS
25.0000 mg | ORAL_CAPSULE | Freq: Once | ORAL | Status: AC
  Administered 2013-06-19: 25 mg via ORAL
  Filled 2013-06-19: qty 1

## 2013-06-19 MED ORDER — METRONIDAZOLE IN NACL 5-0.79 MG/ML-% IV SOLN
500.0000 mg | Freq: Once | INTRAVENOUS | Status: AC
  Administered 2013-06-19: 500 mg via INTRAVENOUS
  Filled 2013-06-19: qty 100

## 2013-06-19 MED ORDER — IOHEXOL 300 MG/ML  SOLN
100.0000 mL | Freq: Once | INTRAMUSCULAR | Status: AC | PRN
  Administered 2013-06-19: 100 mL via INTRAVENOUS

## 2013-06-19 MED ORDER — CHLORHEXIDINE GLUCONATE CLOTH 2 % EX PADS
6.0000 | MEDICATED_PAD | Freq: Every day | CUTANEOUS | Status: DC
  Administered 2013-06-20 – 2013-06-21 (×2): 6 via TOPICAL

## 2013-06-19 MED ORDER — ACETAMINOPHEN 325 MG PO TABS
650.0000 mg | ORAL_TABLET | Freq: Four times a day (QID) | ORAL | Status: DC | PRN
  Administered 2013-06-19: 650 mg via ORAL
  Filled 2013-06-19: qty 2

## 2013-06-19 MED ORDER — ONDANSETRON HCL 4 MG PO TABS: 4.0000 mg | ORAL_TABLET | Freq: Four times a day (QID) | ORAL | Status: DC | PRN

## 2013-06-19 MED ORDER — METRONIDAZOLE IN NACL 5-0.79 MG/ML-% IV SOLN
500.0000 mg | Freq: Three times a day (TID) | INTRAVENOUS | Status: DC
  Administered 2013-06-19 – 2013-06-20 (×3): 500 mg via INTRAVENOUS
  Filled 2013-06-19 (×5): qty 100

## 2013-06-19 MED ORDER — PNEUMOCOCCAL VAC POLYVALENT 25 MCG/0.5ML IJ INJ
0.5000 mL | INJECTION | INTRAMUSCULAR | Status: AC
  Administered 2013-06-21: 0.5 mL via INTRAMUSCULAR
  Filled 2013-06-19: qty 0.5

## 2013-06-19 MED ORDER — INSULIN ASPART 100 UNIT/ML ~~LOC~~ SOLN: 0.0000 [IU] | SUBCUTANEOUS | Status: DC

## 2013-06-19 MED ORDER — ENOXAPARIN SODIUM 40 MG/0.4ML ~~LOC~~ SOLN
40.0000 mg | SUBCUTANEOUS | Status: DC
  Administered 2013-06-19 – 2013-06-20 (×2): 40 mg via SUBCUTANEOUS
  Filled 2013-06-19 (×4): qty 0.4

## 2013-06-19 MED ORDER — ONDANSETRON HCL 4 MG/2ML IJ SOLN
4.0000 mg | Freq: Four times a day (QID) | INTRAMUSCULAR | Status: DC | PRN
  Administered 2013-06-19: 4 mg via INTRAVENOUS
  Filled 2013-06-19: qty 2

## 2013-06-19 MED ORDER — PANTOPRAZOLE SODIUM 40 MG PO TBEC
40.0000 mg | DELAYED_RELEASE_TABLET | Freq: Every day | ORAL | Status: DC
  Administered 2013-06-19 – 2013-06-21 (×3): 40 mg via ORAL
  Filled 2013-06-19 (×3): qty 1

## 2013-06-19 NOTE — ED Provider Notes (Signed)
CSN: [id number]     Arrival date & time 06/18/13  2321 History   First MD Initiated Contact with Patient 06/19/13 0325     Chief Complaint  Patient presents with  . Abdominal Pain     (Consider location/radiation/quality/duration/timing/severity/associated sxs/prior Treatment) Patient is a 58 y.o. female presenting with abdominal pain. The history is provided by the patient.  Abdominal Pain Pain location:  LLQ and suprapubic Pain quality: stabbing and throbbing   Pain radiates to:  Does not radiate Pain severity:  Severe Onset quality:  Sudden Duration:  1 day Timing:  Constant Progression:  Worsening Chronicity:  New Associated symptoms: anorexia and nausea   Associated symptoms: no chest pain, no diarrhea, no dysuria, no fever, no hematemesis, no hematochezia, no shortness of breath and no vomiting     Past Medical History  Diagnosis Date  . Sinus problem   . Arthritis     knees  . Hay fever    Past Surgical History  Procedure Laterality Date  . Abdominal hysterectomy  2003    partial  . Tonsillectomy      age 82  . Total knee arthroplasty  01/30/2012    Procedure: TOTAL KNEE ARTHROPLASTY;  Surgeon: Gearlean Alf, MD;  Location: [facility];  Service: Orthopedics;  Laterality: Left;  . Foot surgery      bilateral -bone removal  . Knee closed reduction  04/02/2012    Procedure: CLOSED MANIPULATION KNEE;  Surgeon: Gearlean Alf, MD;  Location: [facility];  Service: Orthopedics;  Laterality: Left;   No family history on file. History  Substance Use Topics  . Smoking status: Former Smoker -- 0.50 packs/day for 40 years    Types: Cigarettes    Quit date: 01/28/2012  . Smokeless tobacco: Never Used  . Alcohol Use: No   OB History   Grav Para Term Preterm Abortions TAB SAB Ect Mult Living                 Review of Systems  Constitutional: Positive for activity change. Negative for fever.  Respiratory: Negative for shortness of breath.   Cardiovascular: Negative for  chest pain.  Gastrointestinal: Positive for nausea, abdominal pain and anorexia. Negative for vomiting, diarrhea, blood in stool, hematochezia and hematemesis.  Genitourinary: Negative for dysuria.  Musculoskeletal: Negative for neck pain.  Neurological: Negative for headaches.  All other systems reviewed and are negative.      Allergies  Dilaudid; Codeine; and Morphine and related  Home Medications   Current Outpatient Rx  Name  Route  Sig  Dispense  Refill  . aspirin 81 MG tablet   Oral   Take 81 mg by mouth every morning.          . cetirizine (ZYRTEC) 10 MG tablet   Oral   Take 10 mg by mouth daily as needed for allergies.          . diphenhydrAMINE (BENADRYL) 25 mg capsule   Oral   Take 25 mg by mouth every 6 (six) hours as needed for allergies. For allergies.         Marland Kitchen linagliptin (TRADJENTA) 5 MG TABS tablet   Oral   Take 5 mg by mouth daily.         . naproxen (NAPROSYN) 500 MG tablet   Oral   Take 1 tablet (500 mg total) by mouth 2 (two) times daily with a meal.   30 tablet   0   . naproxen sodium (ANAPROX)  220 MG tablet   Oral   Take 220 mg by mouth daily as needed (for pain).         Marland Kitchen omeprazole (PRILOSEC) 20 MG capsule   Oral   Take 20 mg by mouth daily.         . Tetrahydrozoline HCl (VISINE OP)   Both Eyes   Place 1 drop into both eyes daily.          BP 118/71  Pulse 93  Temp(Src) 98.9 F (37.2 C) (Oral)  Resp 18  SpO2 97% Physical Exam  Nursing note and vitals reviewed. Constitutional: She is oriented to person, place, and time. She appears well-developed and well-nourished.  HENT:  Head: Normocephalic and atraumatic.  Eyes: EOM are normal. Pupils are equal, round, and reactive to light.  Neck: Neck supple.  Cardiovascular: Normal rate, regular rhythm and normal heart sounds.   No murmur heard. Pulmonary/Chest: Effort normal. No respiratory distress.  Abdominal: Soft. She exhibits no distension. There is tenderness.  There is no rebound and no guarding.  Neurological: She is alert and oriented to person, place, and time.  Skin: Skin is warm and dry.    ED Course  Procedures (including critical care time) Labs Review Labs Reviewed  CBC WITH DIFFERENTIAL - Abnormal; Notable for the following:    Hemoglobin 15.5 (*)    Neutrophils Relative % 84 (*)    Lymphocytes Relative 9 (*)    All other components within normal limits  COMPREHENSIVE METABOLIC PANEL - Abnormal; Notable for the following:    Glucose, Bld 111 (*)    Alkaline Phosphatase 123 (*)    All other components within normal limits  URINALYSIS, ROUTINE W REFLEX MICROSCOPIC - Abnormal; Notable for the following:    APPearance CLOUDY (*)    Specific Gravity, Urine 1.031 (*)    Ketones, ur 15 (*)    All other components within normal limits  LIPASE, BLOOD   Imaging Review Ct Abdomen Pelvis W Contrast  06/19/2013   CLINICAL DATA:  Left lower quadrant abdominal pain.  EXAM: CT ABDOMEN AND PELVIS WITH CONTRAST  TECHNIQUE: Multidetector CT imaging of the abdomen and pelvis was performed using the standard protocol following bolus administration of intravenous contrast.  CONTRAST:  134mL OMNIPAQUE IOHEXOL 300 MG/ML  SOLN  COMPARISON:  CT of the abdomen and pelvis 10/10/2013.  FINDINGS: Lung Bases: Unremarkable.  Abdomen/Pelvis: The appearance of the liver, gallbladder, pancreas, spleen and bilateral adrenal glands is unremarkable. Low-attenuation lesions in the kidneys bilaterally, 2 of which are too small to definitively characterize in the upper pole of the right kidney and the anterior aspect of the interpolar region of the left kidney. In addition, there is a 2.1 cm low-attenuation lesion in the parapelvic region of the interpolar left kidney, compatible with a parapelvic cysts.  Numerous colonic diverticulae are noted, particularly in the region of the sigmoid colon. There are some very subtle inflammatory changes adjacent to the proximal sigmoid  colon, which may reflect acute diverticulitis. In addition, in the adjacent sigmoid mesocolon there are several low-attenuation lesions with subtle peripheral rim of soft tissue which are nonspecific in appearance, but favored to represent areas of fat necrosis. These are unlikely to be acute, and may relate to prior episodes of diverticulitis. Normal appendix. No significant volume of ascites. No pneumoperitoneum. No pathologic distention of small bowel. No definite lymphadenopathy identified within the abdomen or pelvis. Status post hysterectomy. Urinary bladder is unremarkable in appearance.  Musculoskeletal: There are no  aggressive appearing lytic or blastic lesions noted in the visualized portions of the skeleton.  IMPRESSION: 1. Findings, as above, concerning for very early or mild acute diverticulitis in the region of the proximal sigmoid colon. There is also some probable fat necrosis in the adjacent sigmoid mesocolon, which is favored to be a chronic finding likely related to prior episodes of diverticulitis. No diverticular abscess or findings to suggest frank perforation at this time. 2. Normal appendix. 3. Additional incidental findings, as above.   Electronically Signed   By: Vinnie Langton M.D.   On: 06/19/2013 04:50     EKG Interpretation None      MDM   Final diagnoses:  Diverticulitis    Pt comes in with LLQ tenderness, shooting to the suprapubic region. + guarding. CT confirms diverticulitis. Pt has severe pain, and doesn't feel that she can go home. She has passed po challenge already.  Will admit as observation for intractable pain. Assuming some of the pain is from the fat necrosis that is appreciated.    Varney Biles, MD 06/19/13 [phone]

## 2013-06-19 NOTE — ED Notes (Addendum)
Report given to Matt Holmes, pt transferred via stretcher by Ronny Bacon NT, pt admitted to [facility] room 28

## 2013-06-19 NOTE — ED Notes (Signed)
Patient transported to CT 

## 2013-06-19 NOTE — H&P (Signed)
[facility] History and Physical  KAYSE PUCCINI JKK:[id number] DOB: August 28, 1955 DOA: 06/19/2013  Referring physician:  PCP: Monico Blitz, MD  Specialists:   Chief Complaint: abdominal pain   HPI: Holly [name] is a 58 y.o. female with h/o diverticulitis >5 years ago presented with L sided abdominal pain associated with nausea, vomiting, diarrhea, fever chills night sweats for 2-3 days and found to have acute diverticulitis. Denies hematochezia, no hematemesis; no chest pain, no SOB, no dizziness; no focal neuro symptoms    Review of Systems: The patient denies anorexia, fever, weight loss,, vision loss, decreased hearing, hoarseness, chest pain, syncope, dyspnea on exertion, peripheral edema, balance deficits, hemoptysis,  hematuria, incontinence, genital sores, muscle weakness, suspicious skin lesions, transient blindness, difficulty walking, depression, unusual weight change, abnormal bleeding, enlarged lymph nodes, angioedema, and breast masses.    Past Medical History  Diagnosis Date  . Sinus problem   . Arthritis     knees  . Hay fever    Past Surgical History  Procedure Laterality Date  . Abdominal hysterectomy  2003    partial  . Tonsillectomy      age 26  . Total knee arthroplasty  01/30/2012    Procedure: TOTAL KNEE ARTHROPLASTY;  Surgeon: Gearlean Alf, MD;  Location: [facility];  Service: Orthopedics;  Laterality: Left;  . Foot surgery      bilateral -bone removal  . Knee closed reduction  04/02/2012    Procedure: CLOSED MANIPULATION KNEE;  Surgeon: Gearlean Alf, MD;  Location: [facility];  Service: Orthopedics;  Laterality: Left;   Social History:  reports that she quit smoking about 16 months ago. Her smoking use included Cigarettes. She has a 20 pack-year smoking history. She has never used smokeless tobacco. She reports that she does not drink alcohol or use illicit drugs. Home  where does patient live--home, ALF, SNF? and with whom if at home? Ys;  Can patient  participate in ADLs?  Allergies  Allergen Reactions  . Dilaudid [Hydromorphone Hcl] Nausea And Vomiting  . Codeine Nausea And Vomiting    fever  . Morphine And Related Nausea And Vomiting    fever    No family history on file.  (be sure to complete)  Prior to Admission medications   Medication Sig Start Date End Date Taking? Authorizing Provider  aspirin 81 MG tablet Take 81 mg by mouth every morning.    Yes Historical Provider, MD  cetirizine (ZYRTEC) 10 MG tablet Take 10 mg by mouth daily as needed for allergies.    Yes Historical Provider, MD  diphenhydrAMINE (BENADRYL) 25 mg capsule Take 25 mg by mouth every 6 (six) hours as needed for allergies. For allergies.   Yes Historical Provider, MD  linagliptin (TRADJENTA) 5 MG TABS tablet Take 5 mg by mouth daily.   Yes Historical Provider, MD  naproxen (NAPROSYN) 500 MG tablet Take 1 tablet (500 mg total) by mouth 2 (two) times daily with a meal. 03/29/13  Yes Johnna Acosta, MD  naproxen sodium (ANAPROX) 220 MG tablet Take 220 mg by mouth daily as needed (for pain).   Yes Historical Provider, MD  omeprazole (PRILOSEC) 20 MG capsule Take 20 mg by mouth daily.   Yes Historical Provider, MD  Tetrahydrozoline HCl (VISINE OP) Place 1 drop into both eyes daily.   Yes Historical Provider, MD   Physical Exam: Filed Vitals:   06/19/13 0630  BP: 138/74  Pulse: 97  Temp:   Resp:  General:  alert  Eyes: EOM-I  ENT: no oral ulcers   Neck: supple   Cardiovascular: s1,s2 rrr  Respiratory: CTA BL  Abdomen: soft, mild L sided tender, no rebound, no guarding   Skin: no rash  Musculoskeletal: no LE edema  Psychiatric: no hallucinations   Neurologic: CN 2-12 intact   Labs on Admission:  Basic Metabolic Panel:  Recent Labs Lab 06/18/13 2330  NA 138  K 5.3  CL 104  CO2 19  GLUCOSE 111*  BUN 17  CREATININE 0.64  CALCIUM 9.7   Liver Function Tests:  Recent Labs Lab 06/18/13 2330  AST 37  ALT 22  ALKPHOS 123*   BILITOT 0.7  PROT 8.2  ALBUMIN 4.0    Recent Labs Lab 06/18/13 2330  LIPASE 18   No results found for this basename: AMMONIA,  in the last 168 hours CBC:  Recent Labs Lab 06/18/13 2330  WBC 7.8  NEUTROABS 6.6  HGB 15.5*  HCT 45.4  MCV 92.1  PLT 308   Cardiac Enzymes: No results found for this basename: CKTOTAL, CKMB, CKMBINDEX, TROPONINI,  in the last 168 hours  BNP (last 3 results) No results found for this basename: PROBNP,  in the last 8760 hours CBG: No results found for this basename: GLUCAP,  in the last 168 hours  Radiological Exams on Admission: Ct Abdomen Pelvis W Contrast  06/19/2013   CLINICAL DATA:  Left lower quadrant abdominal pain.  EXAM: CT ABDOMEN AND PELVIS WITH CONTRAST  TECHNIQUE: Multidetector CT imaging of the abdomen and pelvis was performed using the standard protocol following bolus administration of intravenous contrast.  CONTRAST:  175mL OMNIPAQUE IOHEXOL 300 MG/ML  SOLN  COMPARISON:  CT of the abdomen and pelvis 10/10/2013.  FINDINGS: Lung Bases: Unremarkable.  Abdomen/Pelvis: The appearance of the liver, gallbladder, pancreas, spleen and bilateral adrenal glands is unremarkable. Low-attenuation lesions in the kidneys bilaterally, 2 of which are too small to definitively characterize in the upper pole of the right kidney and the anterior aspect of the interpolar region of the left kidney. In addition, there is a 2.1 cm low-attenuation lesion in the parapelvic region of the interpolar left kidney, compatible with a parapelvic cysts.  Numerous colonic diverticulae are noted, particularly in the region of the sigmoid colon. There are some very subtle inflammatory changes adjacent to the proximal sigmoid colon, which may reflect acute diverticulitis. In addition, in the adjacent sigmoid mesocolon there are several low-attenuation lesions with subtle peripheral rim of soft tissue which are nonspecific in appearance, but favored to represent areas of fat  necrosis. These are unlikely to be acute, and may relate to prior episodes of diverticulitis. Normal appendix. No significant volume of ascites. No pneumoperitoneum. No pathologic distention of small bowel. No definite lymphadenopathy identified within the abdomen or pelvis. Status post hysterectomy. Urinary bladder is unremarkable in appearance.  Musculoskeletal: There are no aggressive appearing lytic or blastic lesions noted in the visualized portions of the skeleton.  IMPRESSION: 1. Findings, as above, concerning for very early or mild acute diverticulitis in the region of the proximal sigmoid colon. There is also some probable fat necrosis in the adjacent sigmoid mesocolon, which is favored to be a chronic finding likely related to prior episodes of diverticulitis. No diverticular abscess or findings to suggest frank perforation at this time. 2. Normal appendix. 3. Additional incidental findings, as above.   Electronically Signed   By: Vinnie Langton M.D.   On: 06/19/2013 04:50    EKG: Independently  reviewed. No tdone   Assessment/Plan Principal Problem:   Diverticulitis Active Problems:   Nausea & vomiting   Abdominal pain   58 y.o. female with h/o diverticulitis >5 years ago presented with L sided abdominal pain associated with nausea, vomiting, diarrhea, fever chills night sweats for 2-3 days and found to have acute diverticulitis.  1. Acute diverticulitis, uncomplicated;  -YQ:MVHQ acute diverticulitis in the region of the proximal sigmoid colon. There is also some probable fat necrosis in the adjacent sigmoid mesocolon,  which is favored to be a chronic finding likely related to prior episodes of diverticulitis. No diverticular abscess or findings to suggest frank perforation at this time. -started IV atx, antiemetics, IVF, NPO; diet later if tolerated; need OP colonoscopy 4-6 weeks  2. Nausea, vomiting few episodes of diarrhea due to #1 -cont IVF, as above    3. H/o DM; ISS for now;  check HA1C;   4. GERD; cont PPI; hold NSAIDs     nonel  if consultant consulted, please document name and whether formally or informally consulted  Code Status: full (must indicate code status--if unknown or must be presumed, indicate so) Family Communication: d/w patient (indicate person spoken with, if applicable, with phone number if by telephone) Disposition Plan: home 24-48 hours  (indicate anticipated LOS)  Time spent: >35 minutes   Kinnie Feil [facility] Pager [phone]  If 7PM-7AM, please contact night-coverage [email or web address] Password [facility] 06/19/2013, 7:31 AM

## 2013-06-19 NOTE — Progress Notes (Signed)
Lab reports: MRSA PCR = Positive. Will place patient on contact precautions.

## 2013-06-19 NOTE — Progress Notes (Signed)
ANTIBIOTIC CONSULT NOTE - INITIAL  Pharmacy Consult for Cipro Indication: intra-abdominal infection  Allergies  Allergen Reactions  . Dilaudid [Hydromorphone Hcl] Nausea And Vomiting  . Codeine Nausea And Vomiting    fever  . Morphine And Related Nausea And Vomiting    fever    Patient Measurements:    Vital Signs: Temp: 98.1 F (36.7 C) (03/25 1129) Temp src: Oral (03/25 1129) BP: 133/75 mmHg (03/25 1129) Pulse Rate: 81 (03/25 1129) Intake/Output from previous day:   Intake/Output from this shift: Total I/O In: 300 [I.V.:300] Out: -   Labs:  Recent Labs  06/18/13 2330  WBC 7.8  HGB 15.5*  PLT 308  CREATININE 0.64   The CrCl is unknown because both a height and weight (above a minimum accepted value) are required for this calculation. No results found for this basename: VANCOTROUGH, VANCOPEAK, VANCORANDOM, GENTTROUGH, GENTPEAK, GENTRANDOM, TOBRATROUGH, TOBRAPEAK, TOBRARND, AMIKACINPEAK, AMIKACINTROU, AMIKACIN,  in the last 72 hours   Microbiology: No results found for this or any previous visit (from the past 720 hour(s)).  Medical History: Past Medical History  Diagnosis Date  . Sinus problem   . Arthritis     knees  . Hay fever     Medications:  Anti-infectives   Start     Dose/Rate Route Frequency Ordered Stop   06/19/13 1200  metroNIDAZOLE (FLAGYL) IVPB 500 mg     500 mg 100 mL/hr over 60 Minutes Intravenous Every 8 hours 06/19/13 1140     06/19/13 0515  ciprofloxacin (CIPRO) IVPB 400 mg     400 mg 200 mL/hr over 60 Minutes Intravenous  Once 06/19/13 0514 06/19/13 0746   06/19/13 0515  metroNIDAZOLE (FLAGYL) IVPB 500 mg     500 mg 100 mL/hr over 60 Minutes Intravenous  Once 06/19/13 0539 06/19/13 0655     Assessment: 58 year old female admitted with acute diverticulitis.  To continue antibiotic therapy with Cipro/Flagyl.  Renal function is within normal limits.  Plan:  Cipro 400mg  IV q12h As no dosage adjustments are anticipated pharmacy  will sign off.  Thank you for the consult.  Legrand Como, Pharm.D., BCPS, AAHIVP Clinical Pharmacist Phone: [phone] or [phone] 06/19/2013, 11:50 AM

## 2013-06-19 NOTE — ED Notes (Signed)
MD at bedside. 

## 2013-06-19 NOTE — ED Notes (Signed)
Receiving RN unable to take report

## 2013-06-20 DIAGNOSIS — E876 Hypokalemia: Secondary | ICD-10-CM

## 2013-06-20 LAB — CBC
HEMATOCRIT: 39.3 % (ref 36.0–46.0)
HEMOGLOBIN: 12.9 g/dL (ref 12.0–15.0)
MCH: 30.4 pg (ref 26.0–34.0)
MCHC: 32.8 g/dL (ref 30.0–36.0)
MCV: 92.7 fL (ref 78.0–100.0)
Platelets: 234 10*3/uL (ref 150–400)
RBC: 4.24 MIL/uL (ref 3.87–5.11)
RDW: 14.2 % (ref 11.5–15.5)
WBC: 3.7 10*3/uL — AB (ref 4.0–10.5)

## 2013-06-20 LAB — BASIC METABOLIC PANEL
BUN: 11 mg/dL (ref 6–23)
CHLORIDE: 107 meq/L (ref 96–112)
CO2: 21 mEq/L (ref 19–32)
Calcium: 8.7 mg/dL (ref 8.4–10.5)
Creatinine, Ser: 0.7 mg/dL (ref 0.50–1.10)
GFR calc non Af Amer: >90 mL/min (ref >90)
GLUCOSE: 91 mg/dL (ref 70–99)
POTASSIUM: 3.6 meq/L — AB (ref 3.7–5.3)
Sodium: 141 mEq/L (ref 137–147)

## 2013-06-20 LAB — GLUCOSE, CAPILLARY
GLUCOSE-CAPILLARY: 88 mg/dL (ref 70–99)
GLUCOSE-CAPILLARY: 116 mg/dL — AB (ref 70–99)
GLUCOSE-CAPILLARY: 114 mg/dL — AB (ref 70–99)
Glucose-Capillary: 77 mg/dL (ref 70–99)
Glucose-Capillary: 84 mg/dL (ref 70–99)
Glucose-Capillary: 91 mg/dL (ref 70–99)

## 2013-06-20 MED ORDER — GI COCKTAIL ~~LOC~~
30.0000 mL | Freq: Two times a day (BID) | ORAL | Status: DC | PRN
  Filled 2013-06-20: qty 30

## 2013-06-20 MED ORDER — CALCIUM CARBONATE ANTACID 500 MG PO CHEW
1.0000 | CHEWABLE_TABLET | Freq: Three times a day (TID) | ORAL | Status: DC
  Administered 2013-06-20 – 2013-06-21 (×3): 200 mg via ORAL
  Filled 2013-06-20 (×5): qty 1

## 2013-06-20 MED ORDER — CIPROFLOXACIN HCL 500 MG PO TABS
500.0000 mg | ORAL_TABLET | Freq: Two times a day (BID) | ORAL | Status: DC
  Administered 2013-06-20 – 2013-06-21 (×3): 500 mg via ORAL
  Filled 2013-06-20 (×4): qty 1

## 2013-06-20 MED ORDER — METRONIDAZOLE 500 MG PO TABS
500.0000 mg | ORAL_TABLET | Freq: Three times a day (TID) | ORAL | Status: DC
  Administered 2013-06-20 – 2013-06-21 (×4): 500 mg via ORAL
  Filled 2013-06-20 (×7): qty 1

## 2013-06-20 MED ORDER — POTASSIUM CHLORIDE CRYS ER 20 MEQ PO TBCR
40.0000 meq | EXTENDED_RELEASE_TABLET | Freq: Once | ORAL | Status: AC
  Administered 2013-06-20: 40 meq via ORAL
  Filled 2013-06-20: qty 2

## 2013-06-20 MED ORDER — POTASSIUM CHLORIDE 10 MEQ/100ML IV SOLN
10.0000 meq | INTRAVENOUS | Status: AC
  Administered 2013-06-20: 10 meq via INTRAVENOUS
  Filled 2013-06-20: qty 100

## 2013-06-20 NOTE — Progress Notes (Signed)
PROGRESS NOTE  Holly [name] [id number]:[id number] DOB: 08/17/1955 DOA: 06/19/2013 PCP: Holly Blitz, MD  Assessment/Plan: Acute diverticulitis, uncomplicated;  -HQ:IONG acute diverticulitis in the region of the proximal sigmoid colon. There is also some probable fat necrosis in the adjacent sigmoid mesocolon, which is favored to be a chronic finding likely related to prior episodes of diverticulitis. No diverticular abscess or findings to suggest frank perforation at this time.  -antiemetics -clears- advanced to full -change abx to PO -need OP colonoscopy 4-6 weeks   Nausea, vomiting few episodes of diarrhea due to #1  -cont IVF, as above   Hypokalemia -replete  H/o DM; ISS for now;  HA1C 6  GERD; cont PPI; hold NSAIDs   Code Status: full Family Communication: patient Disposition Plan: home in AM?? If tolerating oral abx and diet   Consultants:    Procedures:      HPI/Subjective: No SOB, no CP No fever, no chills  Objective: Filed Vitals:   06/19/13 2140  BP: 123/74  Pulse: 80  Temp: 98 F (36.7 C)  Resp: 17    Intake/Output Summary (Last 24 hours) at 06/20/13 0900 Last data filed at 06/19/13 2300  Gross per 24 hour  Intake   1319 ml  Output      0 ml  Net   1319 ml   There were no vitals filed for this visit.  Exam:   General:  A+Ox3, NAD  Cardiovascular: rrr  Respiratory: clear anterior  Abdomen: +BS, soft, mild tenderness  Musculoskeletal: moves all 4 ext   Data Reviewed: Basic Metabolic Panel:  Recent Labs Lab 06/18/13 2330 06/19/13 1330 06/20/13 0536  NA 138  --  141  K 5.3  --  3.6*  CL 104  --  107  CO2 19  --  21  GLUCOSE 111*  --  91  BUN 17  --  11  CREATININE 0.64 0.69 0.70  CALCIUM 9.7  --  8.7   Liver Function Tests:  Recent Labs Lab 06/18/13 2330  AST 37  ALT 22  ALKPHOS 123*  BILITOT 0.7  PROT 8.2  ALBUMIN 4.0    Recent Labs Lab 06/18/13 2330  LIPASE 18   No results found for this basename: AMMONIA,   in the last 168 hours CBC:  Recent Labs Lab 06/18/13 2330 06/19/13 1330 06/20/13 0536  WBC 7.8 5.4 3.7*  NEUTROABS 6.6  --   --   HGB 15.5* 13.8 12.9  HCT 45.4 40.8 39.3  MCV 92.1 92.5 92.7  PLT 308 243 234   Cardiac Enzymes: No results found for this basename: CKTOTAL, CKMB, CKMBINDEX, TROPONINI,  in the last 168 hours BNP (last 3 results) No results found for this basename: PROBNP,  in the last 8760 hours CBG:  Recent Labs Lab 06/19/13 1324 06/19/13 1949 06/20/13 0107 06/20/13 0355 06/20/13 0737  GLUCAP 99 88 77 88 116*    Recent Results (from the past 240 hour(s))  MRSA PCR SCREENING     Status: Abnormal   Collection Time    06/19/13 12:51 PM      Result Value Ref Range Status   MRSA by PCR POSITIVE (*) NEGATIVE Final   Comment:            The GeneXpert MRSA Assay (FDA     approved for NASAL specimens     only), is one component of a     comprehensive MRSA colonization     surveillance program. It is not  intended to diagnose MRSA     infection nor to guide or     monitor treatment for     MRSA infections.     RESULT CALLED TO, READ BACK BY AND VERIFIED WITH:     L. BLACKWELL RN 14:25 06/19/13 ([email or web address])     Studies: Ct Abdomen Pelvis W Contrast  06/19/2013   CLINICAL DATA:  Left lower quadrant abdominal pain.  EXAM: CT ABDOMEN AND PELVIS WITH CONTRAST  TECHNIQUE: Multidetector CT imaging of the abdomen and pelvis was performed using the standard protocol following bolus administration of intravenous contrast.  CONTRAST:  168mL OMNIPAQUE IOHEXOL 300 MG/ML  SOLN  COMPARISON:  CT of the abdomen and pelvis 10/10/2013.  FINDINGS: Lung Bases: Unremarkable.  Abdomen/Pelvis: The appearance of the liver, gallbladder, pancreas, spleen and bilateral adrenal glands is unremarkable. Low-attenuation lesions in the kidneys bilaterally, 2 of which are too small to definitively characterize in the upper pole of the right kidney and the anterior aspect of the interpolar region  of the left kidney. In addition, there is a 2.1 cm low-attenuation lesion in the parapelvic region of the interpolar left kidney, compatible with a parapelvic cysts.  Numerous colonic diverticulae are noted, particularly in the region of the sigmoid colon. There are some very subtle inflammatory changes adjacent to the proximal sigmoid colon, which may reflect acute diverticulitis. In addition, in the adjacent sigmoid mesocolon there are several low-attenuation lesions with subtle peripheral rim of soft tissue which are nonspecific in appearance, but favored to represent areas of fat necrosis. These are unlikely to be acute, and may relate to prior episodes of diverticulitis. Normal appendix. No significant volume of ascites. No pneumoperitoneum. No pathologic distention of small bowel. No definite lymphadenopathy identified within the abdomen or pelvis. Status post hysterectomy. Urinary bladder is unremarkable in appearance.  Musculoskeletal: There are no aggressive appearing lytic or blastic lesions noted in the visualized portions of the skeleton.  IMPRESSION: 1. Findings, as above, concerning for very early or mild acute diverticulitis in the region of the proximal sigmoid colon. There is also some probable fat necrosis in the adjacent sigmoid mesocolon, which is favored to be a chronic finding likely related to prior episodes of diverticulitis. No diverticular abscess or findings to suggest frank perforation at this time. 2. Normal appendix. 3. Additional incidental findings, as above.   Electronically Signed   By: Vinnie Langton M.D.   On: 06/19/2013 04:50    Scheduled Meds: . aspirin  81 mg Oral q morning - 10a  . Chlorhexidine Gluconate Cloth  6 each Topical Q0600  . ciprofloxacin  500 mg Oral BID  . enoxaparin (LOVENOX) injection  40 mg Subcutaneous Q24H  . insulin aspart  0-9 Units Subcutaneous 6 times per day  . loratadine  10 mg Oral Daily  . metroNIDAZOLE  500 mg Oral 3 times per day  .  mupirocin ointment  1 application Nasal BID  . pantoprazole  40 mg Oral Daily  . pneumococcal 23 valent vaccine  0.5 mL Intramuscular Tomorrow-1000  . potassium chloride  10 mEq Intravenous Q1 Hr x 2  . tetrahydrozoline  1 drop Both Eyes BID   Continuous Infusions: . sodium chloride 100 mL/hr at 06/19/13 2039   Antibiotics Given (last 72 hours)   Date/Time Action Medication Dose Rate   06/19/13 1321 Given   metroNIDAZOLE (FLAGYL) IVPB 500 mg 500 mg 100 mL/hr   06/19/13 1822 Given   ciprofloxacin (CIPRO) IVPB 400 mg 400 mg 200 mL/hr  06/19/13 2038 Given   metroNIDAZOLE (FLAGYL) IVPB 500 mg 500 mg 100 mL/hr   06/20/13 0404 Given   metroNIDAZOLE (FLAGYL) IVPB 500 mg 500 mg 100 mL/hr   06/20/13 6962 Given   ciprofloxacin (CIPRO) IVPB 400 mg 400 mg 200 mL/hr      Principal Problem:   Diverticulitis Active Problems:   Nausea & vomiting   Abdominal pain   Acute diverticulitis    Time spent: 35 min    Abdi Husak, [facility] Pager [phone]. If 7PM-7AM, please contact night-coverage at [email or web address], password [facility] 06/20/2013, 9:00 AM  LOS: 1 day

## 2013-06-20 NOTE — Progress Notes (Signed)
UR completed. Patient changed to inpatient- requiring IVF@ 100cc/hr  

## 2013-06-21 LAB — GLUCOSE, CAPILLARY
GLUCOSE-CAPILLARY: 102 mg/dL — AB (ref 70–99)
GLUCOSE-CAPILLARY: 97 mg/dL (ref 70–99)
Glucose-Capillary: 119 mg/dL — ABNORMAL HIGH (ref 70–99)
Glucose-Capillary: 84 mg/dL (ref 70–99)

## 2013-06-21 MED ORDER — METRONIDAZOLE 500 MG PO TABS: 500.0000 mg | ORAL_TABLET | Freq: Three times a day (TID) | ORAL | Status: DC

## 2013-06-21 MED ORDER — ONDANSETRON HCL 4 MG PO TABS: 4.0000 mg | ORAL_TABLET | Freq: Four times a day (QID) | ORAL | Status: DC | PRN

## 2013-06-21 MED ORDER — CIPROFLOXACIN HCL 500 MG PO TABS: 500.0000 mg | ORAL_TABLET | Freq: Two times a day (BID) | ORAL | Status: DC

## 2013-06-21 NOTE — Discharge Planning (Signed)
Patient discharged home in stable condition. Verbalizes understanding of all discharge instructions, including home medications and follow up appointments. 

## 2013-06-21 NOTE — Discharge Summary (Signed)
Physician Discharge Summary  Holly [name] RUE:[id number] DOB: [birth year] DOA: 06/19/2013  PCP: Monico Blitz, MD  Admit date: 06/19/2013 Discharge date: 06/21/2013  Time spent: 35 minutes  Recommendations for Outpatient Follow-up:  1. Outpatient colonoscopy  Discharge Diagnoses:  Principal Problem:   Diverticulitis Active Problems:   Nausea & vomiting   Abdominal pain   Acute diverticulitis   Hypokalemia   Discharge Condition: improved  Diet recommendation: low residue  Filed Weights   06/21/13 0756  Weight: 91.173 kg (201 lb)    History of present illness:  Holly [name] is a 58 y.o. female with h/o diverticulitis >5 years ago presented with L sided abdominal pain associated with nausea, vomiting, diarrhea, fever chills night sweats for 2-3 days and found to have acute diverticulitis. Denies hematochezia, no hematemesis; no chest pain, no SOB, no dizziness; no focal neuro symptoms    Hospital Course:  Acute diverticulitis, uncomplicated;  -JY:NWGN acute diverticulitis in the region of the proximal sigmoid colon. There is also some probable fat necrosis in the adjacent sigmoid mesocolon, which is favored to be a chronic finding likely related to prior episodes of diverticulitis. No diverticular abscess or findings to suggest frank perforation at this time.  -antiemetics  -tolerating diet -change abx to PO  -need OP colonoscopy 4-6 weeks   Nausea, vomiting few episodes of diarrhea due to #1  -resolved, patient tolerating diet  Hypokalemia  -replete   H/o DM; resume home meds; HA1C 6   GERD; cont PPI; hold NSAIDs   Procedures:    Consultations:    Discharge Exam: Filed Vitals:   06/21/13 0505  BP: 111/57  Pulse: 87  Temp: 98.6 F (37 C)  Resp: 19    General: A+Ox3, NAD Cardiovascular: rrr Respiratory: clear anterior  Discharge Instructions     Medication List    ASK your doctor about these medications       aspirin 81 MG tablet  Take 81  mg by mouth every morning.     cetirizine 10 MG tablet  Commonly known as:  ZYRTEC  Take 10 mg by mouth daily as needed for allergies.     diphenhydrAMINE 25 mg capsule  Commonly known as:  BENADRYL  Take 25 mg by mouth every 6 (six) hours as needed for allergies. For allergies.     Linaclotide 145 MCG Caps capsule  Commonly known as:  LINZESS  Take 145 mcg by mouth every other day.     linagliptin 5 MG Tabs tablet  Commonly known as:  TRADJENTA  Take 5 mg by mouth daily.     naproxen 500 MG tablet  Commonly known as:  NAPROSYN  Take 1 tablet (500 mg total) by mouth 2 (two) times daily with a meal.     naproxen sodium 220 MG tablet  Commonly known as:  ANAPROX  Take 220 mg by mouth daily as needed (for pain).     omeprazole 20 MG capsule  Commonly known as:  PRILOSEC  Take 20 mg by mouth daily.     VISINE OP  Place 1 drop into both eyes daily.       Allergies  Allergen Reactions  . Dilaudid [Hydromorphone Hcl] Nausea And Vomiting  . Codeine Nausea And Vomiting    fever  . Morphine And Related Nausea And Vomiting    fever      The results of significant diagnostics from this hospitalization (including imaging, microbiology, ancillary and laboratory) are listed below for reference.    Significant  Diagnostic Studies: Ct Abdomen Pelvis W Contrast  06/19/2013   CLINICAL DATA:  Left lower quadrant abdominal pain.  EXAM: CT ABDOMEN AND PELVIS WITH CONTRAST  TECHNIQUE: Multidetector CT imaging of the abdomen and pelvis was performed using the standard protocol following bolus administration of intravenous contrast.  CONTRAST:  128mL OMNIPAQUE IOHEXOL 300 MG/ML  SOLN  COMPARISON:  CT of the abdomen and pelvis 10/10/2013.  FINDINGS: Lung Bases: Unremarkable.  Abdomen/Pelvis: The appearance of the liver, gallbladder, pancreas, spleen and bilateral adrenal glands is unremarkable. Low-attenuation lesions in the kidneys bilaterally, 2 of which are too small to definitively  characterize in the upper pole of the right kidney and the anterior aspect of the interpolar region of the left kidney. In addition, there is a 2.1 cm low-attenuation lesion in the parapelvic region of the interpolar left kidney, compatible with a parapelvic cysts.  Numerous colonic diverticulae are noted, particularly in the region of the sigmoid colon. There are some very subtle inflammatory changes adjacent to the proximal sigmoid colon, which may reflect acute diverticulitis. In addition, in the adjacent sigmoid mesocolon there are several low-attenuation lesions with subtle peripheral rim of soft tissue which are nonspecific in appearance, but favored to represent areas of fat necrosis. These are unlikely to be acute, and may relate to prior episodes of diverticulitis. Normal appendix. No significant volume of ascites. No pneumoperitoneum. No pathologic distention of small bowel. No definite lymphadenopathy identified within the abdomen or pelvis. Status post hysterectomy. Urinary bladder is unremarkable in appearance.  Musculoskeletal: There are no aggressive appearing lytic or blastic lesions noted in the visualized portions of the skeleton.  IMPRESSION: 1. Findings, as above, concerning for very early or mild acute diverticulitis in the region of the proximal sigmoid colon. There is also some probable fat necrosis in the adjacent sigmoid mesocolon, which is favored to be a chronic finding likely related to prior episodes of diverticulitis. No diverticular abscess or findings to suggest frank perforation at this time. 2. Normal appendix. 3. Additional incidental findings, as above.   Electronically Signed   By: Vinnie Langton M.D.   On: 06/19/2013 04:50    Microbiology: Recent Results (from the past 240 hour(s))  MRSA PCR SCREENING     Status: Abnormal   Collection Time    06/19/13 12:51 PM      Result Value Ref Range Status   MRSA by PCR POSITIVE (*) NEGATIVE Final   Comment:            The  GeneXpert MRSA Assay (FDA     approved for NASAL specimens     only), is one component of a     comprehensive MRSA colonization     surveillance program. It is not     intended to diagnose MRSA     infection nor to guide or     monitor treatment for     MRSA infections.     RESULT CALLED TO, READ BACK BY AND VERIFIED WITH:     L. BLACKWELL RN 14:25 06/19/13 ([email or web address])     Labs: Basic Metabolic Panel:  Recent Labs Lab 06/18/13 2330 06/19/13 1330 06/20/13 0536  NA 138  --  141  K 5.3  --  3.6*  CL 104  --  107  CO2 19  --  21  GLUCOSE 111*  --  91  BUN 17  --  11  CREATININE 0.64 0.69 0.70  CALCIUM 9.7  --  8.7   Liver Function Tests:  Recent Labs Lab 06/18/13 2330  AST 37  ALT 22  ALKPHOS 123*  BILITOT 0.7  PROT 8.2  ALBUMIN 4.0    Recent Labs Lab 06/18/13 2330  LIPASE 18   No results found for this basename: AMMONIA,  in the last 168 hours CBC:  Recent Labs Lab 06/18/13 2330 06/19/13 1330 06/20/13 0536  WBC 7.8 5.4 3.7*  NEUTROABS 6.6  --   --   HGB 15.5* 13.8 12.9  HCT 45.4 40.8 39.3  MCV 92.1 92.5 92.7  PLT 308 243 234   Cardiac Enzymes: No results found for this basename: CKTOTAL, CKMB, CKMBINDEX, TROPONINI,  in the last 168 hours BNP: BNP (last 3 results) No results found for this basename: PROBNP,  in the last 8760 hours CBG:  Recent Labs Lab 06/20/13 1603 06/20/13 2008 06/21/13 0033 06/21/13 0504 06/21/13 0748  GLUCAP 114* 91 84 119* 102*       Signed:  Semiah Konczal  [facility] 06/21/2013, 8:44 AM

## 2013-06-21 NOTE — Discharge Instructions (Signed)
Low-Fiber Diet °Fiber is found in fruits, vegetables, and grains. A low-fiber diet restricts fibrous foods that are not digested in the small intestine. A diet containing about 10 grams of fiber is considered low fiber.  °PURPOSE °· To prevent blockage of a partially obstructed or narrowed gastrointestinal tract. °· To reduce fecal weight and volume. °· To slow the movement of feces. °WHEN IS THIS DIET USED? °· It may be used during the acute phase of Crohn disease, ulcerative colitis, regional enteritis, or diverticulitis. °· It may be used if your intestinal or esophageal tubes are narrowing (stenosis). °· It may be used as a transitional diet following surgery, injury (trauma), or illness. °CHOOSING FOODS °Check labels, especially on foods from the starch list. Often times, dietary fiber content is listed on the nutrition facts panel. Please ask your Registered Dietitian if you have questions about specific foods that are related to your condition, especially if the food is not listed on this handout. °Breads and Starches °· Allowed: White, French, and pita breads, plain rolls, buns, or sweet rolls, doughnuts, waffles, pancakes, bagels. Plain muffins, biscuits, matzoth. Soda, saltine, graham crackers. Pretzels, rusks, melba toast, zwieback. Cooked cereals: cornmeal, farina, or cream cereals. Dry cereals: refined corn, wheat, rice, and oat cereals (check label). Potatoes prepared any way without skins, refined macaroni, spaghetti, noodles, refined rice. °· Avoid: Whole-wheat bread, rolls, and crackers. Multigrains, rye, bran seeds, nuts, or coconut. Cereals containing whole grains, multigrains, bran, coconut, nuts, raisins. Cooked or dry oatmeal. Coarse wheat cereals, granola. Cereals advertised as "high fiber." Potato skins. Whole-grain pasta, wild or brown rice. Popcorn. °Vegetables °· Allowed: Strained tomato and vegetable juices. Fresh lettuce, cucumber, spinach. Well-cooked or canned: asparagus, bean sprouts,  broccoli, cut green beans, cauliflower, pumpkin, beets, mushrooms, yellow squash, tomato, tomato sauce, zucchini, turnips. Keep servings limited to ½ cup. °· Avoid: Fresh, cooked, or canned: artichokes, baked beans, beet greens, Brussels sprouts, corn, kale, legumes, peas, sweet potatoes. Avoid large servings of any vegetables. °Fruit °· Allowed: All fruit juices except prune juice. Cooked or canned fruits without skin and seeds: apricots, applesauce, cantaloupe, cherries, grapefruit, grapes, kiwi, mandarin oranges, peaches, pears, fruit cocktail, pineapple, plums, watermelon. Fresh without skin: banana, grapes, cantaloupe, avocado, cherries, pineapple, kiwi, nectarines, peaches, blueberries. Keep servings limited to ½ cup or 1 piece. °· Avoid: Fresh: apples with or without skin, apricots, mangoes, pears, raspberries, strawberries. Prune juice and juices with pulp, stewed or dried prunes. Dried fruits, raisins, dates. Avoid large servings of all fresh fruits. °Meat and Protein Substitutes °· Allowed: Ground or well-cooked tender beef, ham, veal, lamb, pork, poultry. Eggs, plain cheese. Fish, oysters, shrimp, lobster, other seafood. Liver, organ meats. Smooth nut butters. °· Avoid: Tough, fibrous meats with gristle. Chunky nut butter. Cheese with seeds, nuts, or other foods not allowed. Nuts, seeds, legumes, dried peas, beans, lentils. °Dairy °· Allowed: All milk products except those not allowed. °· Avoid: Yogurt or cheese that contains nuts, seeds, or added fruit.  °Soups and Combination Foods °· Allowed: Bouillon, broth, or cream soups made from allowed foods. Any strained soup. Casseroles or mixed dishes made with allowed foods. °· Avoid: Soups made from vegetables that are not allowed or that contain other foods not allowed. °Desserts and Sweets °· Allowed: Plain cakes and cookies, pie made with allowed fruit, pudding, custard, cream pie. Gelatin, fruit, ice, sherbet, frozen ice pops. Ice cream, ice milk without  nuts. Plain hard candy, honey, jelly, molasses, syrup, sugar, chocolate syrup, gumdrops, marshmallows. °· Avoid: Desserts, cookies, or candies that contain   nuts, peanut butter, dried fruits. Jams, preserves with seeds, marmalade. °Fats and Oils °· Allowed: Margarine, butter, cream, mayonnaise, salad oils, plain salad dressings made from allowed foods. °· Avoid: Seeds, nuts, olives.  °Beverages °· Allowed: All, except those listed to avoid. °· Avoid: Fruit juices with high pulp, prune juice. °Condiments °· Allowed: Ketchup, mustard, horseradish, vinegar, cream sauce, cheese sauce, cocoa powder. Spices in moderation: allspice, basil, bay leaves, celery powder or leaves, cinnamon, cumin powder, curry powder, ginger, mace, marjoram, onion or garlic powder, oregano, paprika, parsley flakes, ground pepper, rosemary, sage, savory, tarragon, thyme, turmeric. °· Avoid: Coconut, pickles. °SAMPLE MENU °Breakfast °· ½ cup orange juice. °· 1 boiled egg. °· 1 slice white toast. °· Margarine. °· ¾ cup cornflakes. °· 1 cup milk. °· Beverage. °Lunch °· ½ cup chicken noodle soup. °· 2 to 3 oz sliced roast beef. °· 2 slices white bread. °· Mayonnaise. °· ½ cup tomato juice. °· 1 small banana. °· Beverage. °Dinner °· 3 oz baked chicken. °· ½ cup scalloped potatoes. °· ½ cup cooked beets. °· White dinner roll. °· Margarine. °· ½ cup canned peaches. °· Beverage. °Document Released: 09/03/2001 Document Revised: 11/14/2012 Document Reviewed: 03/31/2011 °[employer]® Patient Information ©2014 [employer]. ° °

## 2013-07-22 ENCOUNTER — Ambulatory Visit (HOSPITAL_COMMUNITY)
Admission: RE | Admit: 2013-07-22 | Discharge: 2013-07-22 | Disposition: A | Discharge status: Home / Self Care | Payer: BC Managed Care – PPO | Source: Ambulatory Visit | Attending: Orthopedic Surgery | Admitting: Orthopedic Surgery

## 2013-07-22 DIAGNOSIS — M5410 Radiculopathy, site unspecified: Secondary | ICD-10-CM

## 2013-07-22 DIAGNOSIS — R29898 Other symptoms and signs involving the musculoskeletal system: Secondary | ICD-10-CM | POA: Insufficient documentation

## 2013-07-22 DIAGNOSIS — M545 Low back pain, unspecified: Secondary | ICD-10-CM | POA: Insufficient documentation

## 2013-07-22 DIAGNOSIS — M79609 Pain in unspecified limb: Secondary | ICD-10-CM | POA: Insufficient documentation

## 2013-07-22 DIAGNOSIS — IMO0001 Reserved for inherently not codable concepts without codable children: Secondary | ICD-10-CM | POA: Insufficient documentation

## 2013-07-22 NOTE — Evaluation (Signed)
Physical Therapy Evaluation  Patient Details  Name: Holly [name] MRN: [id number] Date of Birth: 10-11-1955  Today's Date: 07/22/2013 Time: 1110-1150 PT Time Calculation (min): 40 min  Charge:  Evaluation             Visit#: 1 of 12  Re-eval: 08/21/13 Assessment Diagnosis: radicular pain Next MD Visit: 08/24/13 Prior Therapy: none  Authorization: BCBS    Past Medical History:  Past Medical History  Diagnosis Date  . Sinus problem   . Arthritis     knees  . Hay fever   . Diverticulitis   . GERD (gastroesophageal reflux disease)   . [id number](563.8)    Past Surgical History:  Past Surgical History  Procedure Laterality Date  . Abdominal hysterectomy  2003    partial  . Tonsillectomy      age 55  . Total knee arthroplasty  01/30/2012    Procedure: TOTAL KNEE ARTHROPLASTY;  Surgeon: Gearlean Alf, MD;  Location: [facility];  Service: Orthopedics;  Laterality: Left;  . Foot surgery      bilateral -bone removal  . Knee closed reduction  04/02/2012    Procedure: CLOSED MANIPULATION KNEE;  Surgeon: Gearlean Alf, MD;  Location: [facility];  Service: Orthopedics;  Laterality: Left;    Subjective Symptoms/Limitations Symptoms: Pt states that all her problems started after she had a total knee replacement in 2013.  She states that she is now having pain in her back that goes down into her Rt ankle after walking more than 30 minutes.  She states she is having difficulty sleeping.  She also needs a TKR on her Lt knee.    She has been referred to PT to improve her functional ability.   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   How long can you sit comfortably?: Pt states sitting causes increased pain after five minutes and then getting up from sitting is difficult.   How long can you stand comfortably?: Pt has increased pain after ten minutes. How long can you walk comfortably?: Pt has increased  pain after 1/2 a mile.  Pt tries to walk two miles three times a week.   Pain Assessment Currently in Pain?: Yes Pain Score: 7  Pain Location: Back Pain Orientation: Lower Pain Type: Chronic pain Pain Radiating Towards: Lt ankle  Pain Onset: More than a month ago Pain Frequency: Constant Pain Relieving Factors: medication,  Effect of Pain on Daily Activities: increases pain      Balance Screening Balance Screen Has the patient fallen in the past 6 months: Yes How many times?: 4 Has the patient had a decrease in activity level because of a fear of falling? : Yes Is the patient reluctant to leave their home because of a fear of falling? : No  Prior Functioning  Prior Function Vocation: On disability Leisure: Hobbies-yes (Comment) Comments: fishing; working in yard  Cognition/Observation Observation/Other Assessments Observations: Pt has a 1/2 " LL difference with Lt LE being longer.    Sensation/Coordination/Flexibility/Functional Tests Functional Tests Functional Tests: FS 23  Assessment RLE Strength Right Hip Flexion: 3-/5 Right Hip Extension: 3-/5 Right Hip ABduction: 3+/5 Right Knee Flexion: 3+/5 Right Knee Extension: 3+/5 Right Ankle Dorsiflexion: 5/5 LLE Strength Left Hip Flexion: 2+/5 Left Hip Extension: 3-/5 Left Hip ABduction: 3+/5 Left Knee Flexion:  (4-/5) Left Knee Extension: 3+/5 Left Ankle Dorsiflexion: 5/5 Lumbar  AROM Lumbar Flexion: wfl with radicular pain in the Lt LE  Lumbar Extension: wnl with c/o of leg pain Lumbar - Right Side Bend: wfl Lumbar - Left Side Bend: wfl Lumbar - Right Rotation: decreased 80% Lumbar - Left Rotation: decreased 80%  Exercise/Treatments Mobility/Balance  Posture/Postural Control Posture/Postural Control: Postural limitations Postural Limitations: Lt shoulder high; Lt illiac crest is high Pt advised to purchase a heel lift.  Stretches Active Hamstring Stretch: 2 reps;30 seconds Single Knee to Chest Stretch:  2 reps;30 seconds    Supine Ab Set: 10 reps Bent Knee Raise: 10 reps Bridge: 10 reps    Physical Therapy Assessment and Plan PT Assessment and Plan Clinical Impression Statement: [name] is a 58 yo female with racdicular pain into her Rt gluteal area.  She is being refered for education in body mecahanics as well as a core strengthening program.  Examinaltion demonstrates decreased ROM, decreased strength and leg length discrepency.  Pt will benefit from skilled therapy to educate in body mechanics and exercise to stnengthen her core to allow pt to have decreased pain and increased functional ability.   Pt will benefit from skilled therapeutic intervention in order to improve on the following deficits: Decreased activity tolerance;Decreased strength;Difficulty walking;Pain;Decreased range of motion;Improper spinal/pelvic alignment Rehab Potential: Good PT Frequency: Min 3X/week PT Duration: 4 weeks PT Treatment/Interventions: Therapeutic activities;Therapeutic exercise;Patient/family education;Manual techniques PT Plan: begin supine and prone core strengthening progress to standing and then introduce body mechanics     Goals Home Exercise Program Pt/caregiver will Perform Home Exercise Program: For increased strengthening PT Goal: Perform Home Exercise Program - Progress: Goal set today PT Short Term Goals Time to Complete Short Term Goals: 2 weeks PT Short Term Goal 1: Pt to bae able to sit for 20 minutes to eat a small meal with comfort PT Short Term Goal 2: Pt to be able to stand for 20 minutes to be able to social ize PT Short Term Goal 3: Pt to be able to walk a mile for better health habits. PT Short Term Goal 4: Pt pain to be no greater than a 5/10 80% of the day. PT Short Term Goal 5: Pt strength to be increased 1/2 grade to allow the above to occur. PT Long Term Goals Time to Complete Long Term Goals: 4 weeks PT Long Term Goal 1: Pt to be able to sit for an hour for  traveling or eating out PT Long Term Goal 2: Pt to be able to stand for 30 minutes to make a meal/ do housework. Long Term Goal 3: Pt to be able to walk for a mile and a half without increased pain for better health habits. Long Term Goal 4: Pt pain to be at a 3/10 or less 70% of the day PT Long Term Goal 5: Pt strength to be increased one grade to allow the above to occure.   Problem List Patient Active Problem List   Diagnosis Date Noted  . Radicular low back pain 07/22/2013  . Bilateral leg weakness 07/22/2013  . Hypokalemia 06/20/2013  . Diverticulitis 06/19/2013  . Nausea & vomiting 06/19/2013  . Abdominal pain 06/19/2013  . Acute diverticulitis 06/19/2013  . Postoperative stiffness of total knee replacement 04/02/2012  . Postop Hyponatremia 01/31/2012  . OA (osteoarthritis) of knee 01/30/2012    PT Plan of Care PT Home Exercise Plan: given  GP    Leeroy Cha 07/22/2013, 4:57 PM  Physician Documentation Your signature is required to indicate approval  of the treatment plan as stated above.  Please sign and either send electronically or make a copy of this report for your files and return this physician signed original.   Please mark one 1.__approve of plan  2. ___approve of plan with the following conditions.   ______________________________                                                          _____________________ Physician Signature                                                                                                             Date Out

## 2013-07-30 ENCOUNTER — Ambulatory Visit (HOSPITAL_COMMUNITY)
Admission: RE | Admit: 2013-07-30 | Discharge: 2013-07-30 | Disposition: A | Discharge status: Home / Self Care | Payer: BC Managed Care – PPO | Source: Ambulatory Visit | Attending: Internal Medicine | Admitting: Internal Medicine

## 2013-07-30 DIAGNOSIS — R29898 Other symptoms and signs involving the musculoskeletal system: Secondary | ICD-10-CM | POA: Insufficient documentation

## 2013-07-30 DIAGNOSIS — IMO0001 Reserved for inherently not codable concepts without codable children: Secondary | ICD-10-CM | POA: Insufficient documentation

## 2013-07-30 DIAGNOSIS — M545 Low back pain, unspecified: Secondary | ICD-10-CM | POA: Insufficient documentation

## 2013-07-30 DIAGNOSIS — M79609 Pain in unspecified limb: Secondary | ICD-10-CM | POA: Insufficient documentation

## 2013-07-30 DIAGNOSIS — M5410 Radiculopathy, site unspecified: Secondary | ICD-10-CM

## 2013-07-30 NOTE — Progress Notes (Signed)
Physical Therapy Treatment Patient Details  Name: Holly [name] MRN: [id number] Date of Birth: 02-23-1956  Today's Date: 07/30/2013 Time: 1018-1100 PT Time Calculation (min): 42 min Charge: Manual 1018-1035, TE 1035-1100  Visit#: 2 of 12  Re-eval: 08/21/13 Assessment Diagnosis: radicular pain Next MD Visit: 08/24/13 Prior Therapy: none  Authorization: BCBS  Authorization Time Period:    Authorization Visit#:   of     Subjective: Symptoms/Limitations Symptoms: Pt stated compliance with HEP with no questions.  Current LBP scale 5/10 Pain Assessment Currently in Pain?: Yes Pain Score: 5  Pain Location: Back Pain Orientation: Lower  Objective:  Exercise/Treatments Stretches Active Hamstring Stretch: 30 seconds;3 reps Piriformis Stretch: 2 reps;30 seconds;Limitations Piriformis Stretch Limitations: 4way with towel Supine Ab Set: 10 reps;Limitations AB Set Limitations: PFC 10" holds  Manual Therapy Manual Therapy: Other (comment) Other Manual Therapy: MET Muscle energy technique for Lt anterior rotation and inflare f/b gait training on TM x 5 min  Physical Therapy Assessment and Plan PT Assessment and Plan Clinical Impression Statement: Began PT POC wtih manual muscle energy technique for Lt sacroiliac anterior rotation and inflare to improve alignment and improved leg length discepency; gait training on TM following MET to set SI alignment with cueing for posture, heel to toe pattern and equalized stride length.  Added stretches to improve LE flexibilty and pelvic floor contraction with tactile cueing to improve activation.   PT Plan: Check SI, MET PRN.  begin supine and prone core strengthening progress to standing and then introduce body mechanics     Goals Home Exercise Program Pt/caregiver will Perform Home Exercise Program: For increased strengthening PT Short Term Goals Time to Complete Short Term Goals: 2 weeks PT Short Term Goal 1: Pt to bae able to sit for 20  minutes to eat a small meal with comfort PT Short Term Goal 1 - Progress: Progressing toward goal PT Short Term Goal 2: Pt to be able to stand for 20 minutes to be able to social ize PT Short Term Goal 3: Pt to be able to walk a mile for better health habits. PT Short Term Goal 4: Pt pain to be no greater than a 5/10 80% of the day. PT Short Term Goal 5: Pt strength to be increased 1/2 grade to allow the above to occur. PT Short Term Goal 5 - Progress: Progressing toward goal PT Long Term Goals Time to Complete Long Term Goals: 4 weeks PT Long Term Goal 1: Pt to be able to sit for an hour for traveling or eating out PT Long Term Goal 2: Pt to be able to stand for 30 minutes to make a meal/ do housework. Long Term Goal 3: Pt to be able to walk for a mile and a half without increased pain for better health habits. Long Term Goal 4: Pt pain to be at a 3/10 or less 70% of the day PT Long Term Goal 5: Pt strength to be increased one grade to allow the above to occure.   Problem List Patient Active Problem List   Diagnosis Date Noted  . Radicular low back pain 07/22/2013  . Bilateral leg weakness 07/22/2013  . Hypokalemia 06/20/2013  . Diverticulitis 06/19/2013  . Nausea & vomiting 06/19/2013  . Abdominal pain 06/19/2013  . Acute diverticulitis 06/19/2013  . Postoperative stiffness of total knee replacement 04/02/2012  . Postop Hyponatremia 01/31/2012  . OA (osteoarthritis) of knee 01/30/2012    PT - End of Session Activity Tolerance: Patient tolerated treatment well  General Behavior During Therapy: [facility] for tasks assessed/performed  GP    Aldona Lento 07/30/2013, 11:33 AM

## 2013-07-31 ENCOUNTER — Ambulatory Visit (HOSPITAL_COMMUNITY)
Admission: RE | Admit: 2013-07-31 | Discharge: 2013-07-31 | Disposition: A | Discharge status: Home / Self Care | Payer: BC Managed Care – PPO | Source: Ambulatory Visit | Attending: Internal Medicine | Admitting: Internal Medicine

## 2013-07-31 NOTE — Progress Notes (Signed)
Physical Therapy Treatment Patient Details  Name: MAEOLA MCHANEY MRN: [id number] Date of Birth: 1956-03-02  Today's Date: 07/31/2013 Time: 1101-1145 PT Time Calculation (min): 44 min Charges: Therex x 36'(1443-1540) Manual x 08'(6761-9509)  Visit#: 3 of 12  Re-eval: 08/21/13  Authorization: BCBS    Subjective: Symptoms/Limitations Symptoms: Pt states that exercises seem to be helping. Pain Assessment Currently in Pain?: Yes Pain Score: 5  Pain Location: Back Pain Orientation: Lower Pain Radiating Towards: Left posterior calf  Exercise/Treatments Supine Ab Set: 10 reps Bent Knee Raise: 10 reps Bridge: 10 reps;5 seconds Other Supine Lumbar Exercises: PFC 10x10" holds Sidelying Hip Abduction: 10 reps Prone  Other Prone Lumbar Exercises: POE during manual   Manual Therapy Other Manual Therapy: Manual stretching of B hips in prone; MFR to left iliotibial band and hamstrings to decrease fascial restrictions  Physical Therapy Assessment and Plan PT Assessment and Plan Clinical Impression Statement: Continued to progress core stability and hip mobility. Sacroiliac in proper alignment, no muscle energy technique necessary. Pt displays significant tightness in hip musculature. Tightness and tenderness noted throughout left iliotibial band and hamstring. Treatment on improving hip mobility and decreasing fascial restrictions to decrease radicular sx. Pt reports decrease radicular sx at end of session. PT Plan: Check SI, MET PRN.  Continue to progress core stability, hip mobility and decrease radicular sx.    Problem List Patient Active Problem List   Diagnosis Date Noted  . Radicular low back pain 07/22/2013  . Bilateral leg weakness 07/22/2013  . Hypokalemia 06/20/2013  . Diverticulitis 06/19/2013  . Nausea & vomiting 06/19/2013  . Abdominal pain 06/19/2013  . Acute diverticulitis 06/19/2013  . Postoperative stiffness of total knee replacement 04/02/2012  . Postop  Hyponatremia 01/31/2012  . OA (osteoarthritis) of knee 01/30/2012    PT - End of Session Activity Tolerance: Patient tolerated treatment well General Behavior During Therapy: [facility] for tasks assessed/performed  Rachelle Hora, PTA 07/31/2013, 12:03 PM

## 2013-08-02 ENCOUNTER — Ambulatory Visit (HOSPITAL_COMMUNITY)
Admission: RE | Admit: 2013-08-02 | Discharge: 2013-08-02 | Disposition: A | Discharge status: Home / Self Care | Payer: BC Managed Care – PPO | Source: Ambulatory Visit | Attending: Internal Medicine | Admitting: Internal Medicine

## 2013-08-02 DIAGNOSIS — M5410 Radiculopathy, site unspecified: Secondary | ICD-10-CM

## 2013-08-02 DIAGNOSIS — R29898 Other symptoms and signs involving the musculoskeletal system: Secondary | ICD-10-CM

## 2013-08-02 NOTE — Progress Notes (Signed)
Physical Therapy Treatment Patient Details  Name: ZISSY HAMLETT MRN: [id number] Date of Birth: 1955-05-31  Today's Date: 08/02/2013 Time: 5001-6429 PT Time Calculation (min): 60 min Charge: Manual 1110-1120, TE 1053-1110, 0379-5583  Visit#: 4 of 12  Re-eval: 08/21/13 Assessment Diagnosis: radicular pain Next MD Visit: Despina Hick 08/24/13 Prior Therapy: none  Authorization: BCBS  Authorization Time Period:    Authorization Visit#:   of     Subjective: Symptoms/Limitations Symptoms: Pt stated LBP seems to stay around a 5/10, c/o increased radicular symptoms from Lt buttocks to posterior knee.  Pt stated the MET allowed some relief Pain Assessment Currently in Pain?: Yes Pain Score: 5  Pain Location: Back Pain Orientation: Lower  Objective:   Exercise/Treatments Stretches Active Hamstring Stretch: 3 reps;30 seconds;Limitations Active Hamstring Stretch Limitations: manual Prone on Elbows Stretch: Limitations Prone on Elbows Stretch Limitations: 2 minutes ITB Stretch: 3 reps;30 seconds;Limitations ITB Stretch Limitations: manual Standing Other Standing Lumbar Exercises: 3D hip excusion to improve hip mobiilty. 10x  Supine Bent Knee Raise: 10 reps;5 seconds Bridge: 10 reps;5 seconds Straight Leg Raise: 10 reps Other Supine Lumbar Exercises: PFC 10x10" holds Sidelying Hip Abduction: 10 reps Other Sidelying Lumbar Exercises:   Prone  Straight Leg Raise: 10 reps  Manual Therapy Manual Therapy: Other (comment) Other Manual Therapy: Sciatic nerve glides supine position  Physical Therapy Assessment and Plan PT Assessment and Plan Clinical Impression Statement: Continued session focus on improving core stabiltiy and hip mobility.  Added 3D hip excursion to improve hip mobilty.  SI joint within alignment, no muscle energy technique required this session. Added sciatic nerve slides following manual stretches to improve nerve lengthening to reduce radicular symptoms, pt stated no  change. PT Plan: Check SI, MET PRN.  Continue to progress core stability, hip mobility and decrease radicular sx.  Continue prone on elbow, hip extension and progress to standing exercises and body mechanics.  Following discussion with evaluated PT, pt may beneift from lumbar traction if radicular symptoms continue.    Goals Home Exercise Program Pt/caregiver will Perform Home Exercise Program: For increased strengthening PT Short Term Goals Time to Complete Short Term Goals: 2 weeks PT Short Term Goal 1: Pt to bae able to sit for 20 minutes to eat a small meal with comfort PT Short Term Goal 1 - Progress: Progressing toward goal PT Short Term Goal 2: Pt to be able to stand for 20 minutes to be able to social ize PT Short Term Goal 2 - Progress: Progressing toward goal PT Short Term Goal 3: Pt to be able to walk a mile for better health habits. PT Short Term Goal 4: Pt pain to be no greater than a 5/10 80% of the day. PT Short Term Goal 4 - Progress: Progressing toward goal PT Short Term Goal 5: Pt strength to be increased 1/2 grade to allow the above to occur. PT Short Term Goal 5 - Progress: Progressing toward goal PT Long Term Goals Time to Complete Long Term Goals: 4 weeks PT Long Term Goal 1: Pt to be able to sit for an hour for traveling or eating out PT Long Term Goal 2: Pt to be able to stand for 30 minutes to make a meal/ do housework. Long Term Goal 3: Pt to be able to walk for a mile and a half without increased pain for better health habits. Long Term Goal 4: Pt pain to be at a 3/10 or less 70% of the day PT Long Term Goal 5: Pt strength  to be increased one grade to allow the above to occure.   Problem List Patient Active Problem List   Diagnosis Date Noted  . Radicular low back pain 07/22/2013  . Bilateral leg weakness 07/22/2013  . Hypokalemia 06/20/2013  . Diverticulitis 06/19/2013  . Nausea & vomiting 06/19/2013  . Abdominal pain 06/19/2013  . Acute diverticulitis  06/19/2013  . Postoperative stiffness of total knee replacement 04/02/2012  . Postop Hyponatremia 01/31/2012  . OA (osteoarthritis) of knee 01/30/2012    PT - End of Session Activity Tolerance: Patient tolerated treatment well General Behavior During Therapy: [facility] for tasks assessed/performed  GP    Aldona Lento 08/02/2013, 1:45 PM

## 2013-08-05 ENCOUNTER — Inpatient Hospital Stay (HOSPITAL_COMMUNITY)
Admission: RE | Admit: 2013-08-05 | Payer: BC Managed Care – PPO | Source: Ambulatory Visit | Admitting: Physical Therapy

## 2013-08-07 ENCOUNTER — Ambulatory Visit (HOSPITAL_COMMUNITY)
Admission: RE | Admit: 2013-08-07 | Discharge: 2013-08-07 | Disposition: A | Discharge status: Home / Self Care | Payer: BC Managed Care – PPO | Source: Ambulatory Visit | Attending: Internal Medicine | Admitting: Internal Medicine

## 2013-08-07 NOTE — Progress Notes (Signed)
Physical Therapy Treatment Patient Details  Name: Holly [name] MRN: [id number] Date of Birth: [birth year]  Today's Date: 08/07/2013 Time: 1100-1143 PT Time Calculation (min): 43 min Charges: Therex x 18'(2099-0689) Manual x 34'(0684-0335)  Visit#: 5 of 12  Re-eval: 08/21/13  Authorization: BCBS   Subjective: Symptoms/Limitations Symptoms: "My legs feel weak, like they're going to give out on me." Pain Assessment Currently in Pain?: Yes Pain Score: 5  Pain Location: Back Pain Orientation: Lower Pain Radiating Towards: Left posterior calf   Exercise/Treatments Stretches Active Hamstring Stretch: 5 reps;10 seconds;Limitations Active Hamstring Stretch Limitations: 3 directions on 14" box Hip Flexor Stretch: 5 reps;10 seconds;Limitations Hip Flexor Stretch Limitations: on 14" box ITB Stretch: 3 reps;30 seconds;Limitations ITB Stretch Limitations: manual Standing Other Standing Lumbar Exercises: 3D hip excusion to improve hip mobility Supine Bent Knee Raise: 10 reps;5 seconds Bridge: 10 reps;5 seconds  Manual Therapy Myofascial Release: To left hamstrings and iliotibial band(ITB) to decrease fascial restrictions and pain Other Manual Therapy: Sciatic nerve glides supine position; manual stretching to left ITB and and B deep hip rotators   Physical Therapy Assessment and Plan PT Assessment and Plan Clinical Impression Statement: Pt completes therex well after initial cueing and demo. Decreased tightness noted throughout right hamstring and calf. Tightness noted throughout right ITB. Manual stretching and myofascial release completed to left hamstring. Began standing LE stretches to decrease tightness and improve mobility. Pt may benefit from trial of mechanical lumbar traction. PT Plan: Check SI, MET PRN.  Continue to progress core stability, hip mobility and decrease radicular sx.  Continue prone on elbow, hip extension and progress to standing exercises and body mechanics.   Begin trial of mechanical lumbar traction next session.    Problem List Patient Active Problem List   Diagnosis Date Noted  . Radicular low back pain 07/22/2013  . Bilateral leg weakness 07/22/2013  . Hypokalemia 06/20/2013  . Diverticulitis 06/19/2013  . Nausea & vomiting 06/19/2013  . Abdominal pain 06/19/2013  . Acute diverticulitis 06/19/2013  . Postoperative stiffness of total knee replacement 04/02/2012  . Postop Hyponatremia 01/31/2012  . OA (osteoarthritis) of knee 01/30/2012    PT - End of Session Activity Tolerance: Patient tolerated treatment well General Behavior During Therapy: [facility] for tasks assessed/performed  Rachelle Hora, PTA  08/07/2013, 12:05 PM

## 2013-08-09 ENCOUNTER — Ambulatory Visit (HOSPITAL_COMMUNITY)
Admission: RE | Admit: 2013-08-09 | Discharge: 2013-08-09 | Disposition: A | Discharge status: Home / Self Care | Payer: BC Managed Care – PPO | Source: Ambulatory Visit | Attending: Physical Therapy | Admitting: Physical Therapy

## 2013-08-09 DIAGNOSIS — R29898 Other symptoms and signs involving the musculoskeletal system: Secondary | ICD-10-CM

## 2013-08-09 DIAGNOSIS — M5410 Radiculopathy, site unspecified: Secondary | ICD-10-CM

## 2013-08-09 NOTE — Progress Notes (Signed)
Physical Therapy Treatment Patient Details  Name: ANJANA CHEEK MRN: [id number] Date of Birth: [birth year]  Today's Date: 08/09/2013 Time: 1100-1145 PT Time Calculation (min): 45 min Charge:  There ex 1100-1134; tx [phone] Visit#: 6 of 12  Re-eval: 08/21/13    Authorization: BCBS     Subjective: Symptoms/Limitations Symptoms: Pt states she feels about the same; her calves are very tight.  Pt states she has been doing her exercises but is unable to demonstrate them properly Pain Assessment Currently in Pain?: Yes Pain Score: 5  Pain Location: Back Pain Orientation: Right     Exercise/Treatments       Stretches Active Hamstring Stretch: 3 reps;30 seconds Single Knee to Chest Stretch: 3 reps;30 seconds Standing Extension: 5 reps (x2) Press Ups: 5 reps Standing Heel Raises: 10 reps Functional Squats: 10 reps Lifting: From 12";5 reps    Supine Bridge: 10 reps    Prone  Single Arm Raise: 10 reps Straight Leg Raise: 10 reps    Modalities Modalities: Traction Traction Type of Traction: Lumbar Max (lbs): 75 Hold Time: statc Time: Pt stated increased LE pain after several minutes of traction.  Traciton was discontinued.  Physical Therapy Assessment and Plan PT Assessment and Plan Clinical Impression Statement: Pt observed going from sit to supine with reverse sit up motion,(typically very painful for low back pt). Pt instructed in proper body mechnics going from sit to supine but was observed ten minutes later doing a reverse sit up again.   Pt SI checked no SI dysfunction founf.  Main limiation is WEAKNESS PT Plan: Continue with education in body mechanics, and stabilization    Goals    Problem List Patient Active Problem List   Diagnosis Date Noted  . Radicular low back pain 07/22/2013  . Bilateral leg weakness 07/22/2013  . Hypokalemia 06/20/2013  . Diverticulitis 06/19/2013  . Nausea & vomiting 06/19/2013  . Abdominal pain 06/19/2013  . Acute  diverticulitis 06/19/2013  . Postoperative stiffness of total knee replacement 04/02/2012  . Postop Hyponatremia 01/31/2012  . OA (osteoarthritis) of knee 01/30/2012    PT - End of Session Activity Tolerance: Patient tolerated treatment well General Behavior During Therapy: [facility] for tasks assessed/performed  GP    Leeroy Cha 08/09/2013, 11:57 AM

## 2013-08-12 ENCOUNTER — Ambulatory Visit (HOSPITAL_COMMUNITY): Payer: BC Managed Care – PPO | Admitting: Physical Therapy

## 2013-08-14 ENCOUNTER — Ambulatory Visit (HOSPITAL_COMMUNITY)
Admission: RE | Admit: 2013-08-14 | Discharge: 2013-08-14 | Disposition: A | Discharge status: Home / Self Care | Payer: BC Managed Care – PPO | Source: Ambulatory Visit | Attending: Internal Medicine | Admitting: Internal Medicine

## 2013-08-14 NOTE — Progress Notes (Signed)
Physical Therapy Treatment Patient Details  Name: Holly [name] MRN: [id number] Date of Birth: November 13, 1955  Today's Date: 08/14/2013 Time: 1100-1145 PT Time Calculation (min): 45 min Visit#: 7 of 12  Re-eval: 08/21/13 Authorization: BCBS  Charges: therex 1100-1125 (25'), traction 4496-7591 (15')  Subjective: Symptoms/Limitations Symptoms: Pt states her calves have been cramping some.  REports her Lt calf is especially tight.   Currently with 5/10 lumbar pain. Pain Assessment Currently in Pain?: Yes Pain Score: 5  Pain Location: Back   Exercise/Treatments Stretches Active Hamstring Stretch: 3 reps;30 seconds Active Hamstring Stretch Limitations: standing on 14" box Standing Side Bend: Limitations Standing Side Bend Limitations: slant board gastroc stretch 3X30" Standing Extension: 5 reps Standing Heel Raises: 10 reps Functional Squats: 10 reps Other Standing Lumbar Exercises: 3D hip excusion to improve hip mobility    Modalities Modalities: Traction Traction Type of Traction: Lumbar Min (lbs): n/a Max (lbs): 75 Hold Time: static Rest Time: n/a  Physical Therapy Assessment and Plan PT Assessment and Plan Clinical Impression Statement: Added gastroc stretch with slant board today.  Attempted lumbar traction again today.  Pt able to tolerate full treatment today.  Pt requires therapist facilitation to perform exercises in correct form.  Pt without complaint of pain during session today.   PT Plan: Continue with education in body mechanics, and stabilization; Assess effects of traction next visit.     Problem List Patient Active Problem List   Diagnosis Date Noted  . Radicular low back pain 07/22/2013  . Bilateral leg weakness 07/22/2013  . Hypokalemia 06/20/2013  . Diverticulitis 06/19/2013  . Nausea & vomiting 06/19/2013  . Abdominal pain 06/19/2013  . Acute diverticulitis 06/19/2013  . Postoperative stiffness of total knee replacement 04/02/2012  . Postop  Hyponatremia 01/31/2012  . OA (osteoarthritis) of knee 01/30/2012    PT - End of Session Activity Tolerance: Patient tolerated treatment well General Behavior During Therapy: [facility] for tasks assessed/performed   Teena Irani, PTA/CLT 08/14/2013, 11:37 AM

## 2013-08-16 ENCOUNTER — Ambulatory Visit (HOSPITAL_COMMUNITY)
Admission: RE | Admit: 2013-08-16 | Discharge: 2013-08-16 | Disposition: A | Discharge status: Home / Self Care | Payer: BC Managed Care – PPO | Source: Ambulatory Visit | Attending: Internal Medicine | Admitting: Internal Medicine

## 2013-08-16 DIAGNOSIS — M5410 Radiculopathy, site unspecified: Secondary | ICD-10-CM

## 2013-08-16 DIAGNOSIS — R29898 Other symptoms and signs involving the musculoskeletal system: Secondary | ICD-10-CM

## 2013-08-16 NOTE — Progress Notes (Signed)
Physical Therapy Treatment Patient Details  Name: Holly [name] MRN: [id number] Date of Birth: 1956-03-20  Today's Date: 08/16/2013 Time: 7673-4193 PT Time Calculation (min): 47 min Charge pt education [phone]; there ex [phone]; trial of tx 1133-1138; there ex 2683-4196 Visit#: 8 of 12  Re-eval: 08/21/13    Authorization: BCBS  Subjective: Symptoms/Limitations Symptoms: Pt states that she did alright with traction and then states that it made her Rt LE numb and tingling  Pain Assessment Pain Score: 5  Pain Location: Back Pain Type: Chronic pain Pain Frequency: Constant     Exercise/Treatments   Stretches Active Hamstring Stretch: 3 reps;30 seconds Single Knee to Chest Stretch: 3 reps;30 seconds Standing Lifting: 10 reps Wall Slides: 10 reps Other Standing Lumbar Exercises: B UE flexion x 10 Seated Sit to Stand: 5 reps Other Seated Lumbar Exercises: sit to supine/ supine to side-lying; side-lying to sit  Supine Bridge: 10 reps Straight Leg Raise: 10 reps;Limitations Straight Leg Raises Limitations: floating Sidelying Hip Abduction: 10 reps Prone  Single Arm Raise: 10 reps Straight Leg Raise: 10 reps Other Prone Lumbar Exercises: double arm raise x 10     Modalities Modalities: Traction Traction Type of Traction: Lumbar Max (lbs): 75 Hold Time: static Time: Once again traction causes radicular sx .  STOP traction  Physical Therapy Assessment and Plan PT Assessment and Plan Clinical Impression Statement: Pt needed therapist faciliation for proper form in all exercises.  Attempted tx but once again pt complains of radicular sx. TX is dicontinued from treatment plan. Reviewed body mechanics with good technique aquired. PT Plan: begin standing lunging activiy.l        Problem List Patient Active Problem List   Diagnosis Date Noted  . Radicular low back pain 07/22/2013  . Bilateral leg weakness 07/22/2013  . Hypokalemia 06/20/2013  . Diverticulitis  06/19/2013  . Nausea & vomiting 06/19/2013  . Abdominal pain 06/19/2013  . Acute diverticulitis 06/19/2013  . Postoperative stiffness of total knee replacement 04/02/2012  . Postop Hyponatremia 01/31/2012  . OA (osteoarthritis) of knee 01/30/2012       GP    Leeroy Cha 08/16/2013, 3:21 PM

## 2013-08-20 ENCOUNTER — Ambulatory Visit (HOSPITAL_COMMUNITY): Payer: BC Managed Care – PPO | Admitting: Physical Therapy

## 2013-08-21 ENCOUNTER — Ambulatory Visit (HOSPITAL_COMMUNITY)
Admission: RE | Admit: 2013-08-21 | Discharge: 2013-08-21 | Disposition: A | Discharge status: Home / Self Care | Payer: BC Managed Care – PPO | Source: Ambulatory Visit | Attending: Internal Medicine | Admitting: Internal Medicine

## 2013-08-21 NOTE — Evaluation (Signed)
Physical Therapy Re-evaluation  Patient Details  Name: Holly [name] MRN: [id number] Date of Birth: 1955/05/17  Today's Date: 08/21/2013 Time: 7409-7964 PT Time Calculation (min): 48 min Charges: MMT x 1 [phone]) Self care x 10'(1107-1117) Therex x 20'(1120-1140)              Visit#: 9 of 12  Re-eval: 08/21/13 Assessment Diagnosis: radicular pain Next MD Visit: Despina Hick May 2015  Authorization: BCBS     Past Medical History:  Past Medical History  Diagnosis Date  . Sinus problem   . Arthritis     knees  . Hay fever   . Diverticulitis   . GERD (gastroesophageal reflux disease)   . DCMMEEGH(637.2)    Past Surgical History:  Past Surgical History  Procedure Laterality Date  . Abdominal hysterectomy  2003    partial  . Tonsillectomy      age 56  . Total knee arthroplasty  01/30/2012    Procedure: TOTAL KNEE ARTHROPLASTY;  Surgeon: Loanne Drilling, MD;  Location: [facility];  Service: Orthopedics;  Laterality: Left;  . Foot surgery      bilateral -bone removal  . Knee closed reduction  04/02/2012    Procedure: CLOSED MANIPULATION KNEE;  Surgeon: Loanne Drilling, MD;  Location: [facility];  Service: Orthopedics;  Laterality: Left;    Subjective Symptoms/Limitations Symptoms: Pt states that she feels like she has improved with therapy. Pain Assessment Currently in Pain?: Yes Pain Score: 4  Pain Location: Back Pain Orientation: Left  Assessment RLE Strength Right Hip Flexion: 5/5 (was 3-/5 on 07/22/13) Right Hip Extension: 4/5 (was 3-/5 on 07/22/13) Right Hip ABduction: 4/5 (was 3+/5 on 07/22/13) Right Knee Flexion: 5/5 (was 3+/5 on 07/22/13) Right Knee Extension: 4/5 (was 3+/5 on 07/22/13) Right Ankle Dorsiflexion: 5/5 (was 5/5 on 07/22/13) LLE Strength Left Hip Flexion: 3+/5 (was 2+/5 on 07/22/13) Left Hip Extension: 3/5 (was 3-/5 on 07/22/13) Left Hip ABduction: 4/5 (was 3+/5 on 07/22/13) Left Knee Flexion: 5/5 (was 4-/5 on 07/22/13) Left Knee Extension: 4/5 (was 3+/5 on  07/22/13) Left Ankle Dorsiflexion: 5/5 (was 5/5 on 07/22/13)  Exercise/Treatments  Stretches Active Hamstring Stretch: 5 reps;10 seconds;Limitations Active Hamstring Stretch Limitations: Standing 14" box 3 directional Aerobic Stationary Bike: NuStep x10' L4 hills #3 to improve strength and activity [place] Standing Wall Slides: 10 reps Other Standing Lumbar Exercises: 3D hip excursion x 10  Physical Therapy Assessment and Plan PT Assessment and Plan Clinical Impression Statement: Pt has progressed well with therapy. Pt displays improved LE strength and decreased pain. LE is not yet within functional limits and pt continues to be limited by radicular pain. Pt would benefit from continuing skilled PT to improve function strength, flexibility, core stability and spinal mobility.  PT Plan: Recommend to continue skilled PT 3x per week x 4 weeks to address remaining deficits.    Goals Home Exercise Program Pt/caregiver will Perform Home Exercise Program: For increased strengthening PT Short Term Goals Time to Complete Short Term Goals: 2 weeks PT Short Term Goal 1: Pt to be able to sit for 20 minutes to eat a small meal with comfort PT Short Term Goal 1 - Progress: Progressing toward goal PT Short Term Goal 2: Pt to be able to stand for 20 minutes to be able to socialize PT Short Term Goal 2 - Progress: Progressing toward goal PT Short Term Goal 3: Pt to be able to walk a mile for better health habits. PT Short Term Goal 3 - Progress: Met  PT Short Term Goal 4: Pt pain to be no greater than a 5/10 80% of the day. PT Short Term Goal 4 - Progress: Met PT Short Term Goal 5: Pt strength to be increased 1/2 grade to allow the above to occur. PT Long Term Goals Time to Complete Long Term Goals: 4 weeks PT Long Term Goal 1: Pt to be able to sit for an hour for traveling or eating out PT Long Term Goal 1 - Progress: Progressing toward goal PT Long Term Goal 2: Pt to be able to stand for 30  minutes to make a meal/ do housework. PT Long Term Goal 2 - Progress: Progressing toward goal Long Term Goal 3: Pt to be able to walk for a mile and a half without increased pain for better health habits. Long Term Goal 3 Progress: Met Long Term Goal 4: Pt pain to be at a 3/10 or less 70% of the day Long Term Goal 4 Progress: Progressing toward goal PT Long Term Goal 5: Pt strength to be increased one grade to allow the above to occure.   Problem List Patient Active Problem List   Diagnosis Date Noted  . Radicular low back pain 07/22/2013  . Bilateral leg weakness 07/22/2013  . Hypokalemia 06/20/2013  . Diverticulitis 06/19/2013  . Nausea & vomiting 06/19/2013  . Abdominal pain 06/19/2013  . Acute diverticulitis 06/19/2013  . Postoperative stiffness of total knee replacement 04/02/2012  . Postop Hyponatremia 01/31/2012  . OA (osteoarthritis) of knee 01/30/2012    Rachelle Hora, PTA  08/21/2013, 12:03 PM  Physician Documentation Your signature is required to indicate approval of the treatment plan as stated above.  Please sign and either send electronically or make a copy of this report for your files and return this physician signed original.   Please mark one 1.__approve of plan  2. ___approve of plan with the following conditions.   ______________________________                                                          _____________________ Physician Signature                                                                                                             Date

## 2013-08-23 ENCOUNTER — Ambulatory Visit (HOSPITAL_COMMUNITY)
Admission: RE | Admit: 2013-08-23 | Discharge: 2013-08-23 | Disposition: A | Discharge status: Home / Self Care | Payer: BC Managed Care – PPO | Source: Ambulatory Visit | Attending: Internal Medicine | Admitting: Internal Medicine

## 2013-08-23 DIAGNOSIS — M5410 Radiculopathy, site unspecified: Secondary | ICD-10-CM

## 2013-08-23 DIAGNOSIS — R29898 Other symptoms and signs involving the musculoskeletal system: Secondary | ICD-10-CM

## 2013-08-23 NOTE — Progress Notes (Signed)
Physical Therapy Treatment Patient Details  Name: Holly [name] MRN: [id number] Date of Birth: 09/27/1955  Today's Date: 08/23/2013 Time: 0998-3382 PT Time Calculation (min): 44 min There ex [id number] Visit#: 10 of 18  Re-eval: 09/13/13    Authorization: BCBS  Subjective: Symptoms/Limitations Symptoms: Pt is doing her exercises  Pain Assessment Currently in Pain?: Yes Pain Score: 4  Pain Location: Back Pain Orientation: Left     Exercise/Treatments    Stretches Active Hamstring Stretch: 3 reps;30 seconds Single Knee to Chest Stretch: 3 reps;30 seconds Pelvic Tilt:  (attempted but pt does not have the coordinaton will try) Standing Forward Lunge: 10 reps Scapular Retraction: 10 reps;Theraband Theraband Level (Scapular Retraction): Level 4 (Blue) Row: 10 reps;Theraband Theraband Level (Row): Level 4 (Blue) Shoulder Extension: Theraband;10 reps Theraband Level (Shoulder Extension): Level 4 (Blue) Other Standing Lumbar Exercises: B UE flexion. Other Standing Lumbar Exercises: functional lift  box x 10    Supine Bridge: 15 reps Other Supine Lumbar Exercises: decompression ex 1-5     Physical Therapy Assessment and Plan PT Assessment and Plan Clinical Impression Statement: Pt completed exercises with therapist facilitation.  Pt has difficulty with lumbar flexion/extesion coordianation.  Added decompression ex to improve pain and Lt knee extension.  Continue with skilled therapy to improve strength and mobility PT Plan: see how decompression ex decreased pain add t-band decompression exercises     Goals  progressing  Problem List Patient Active Problem List   Diagnosis Date Noted  . Radicular low back pain 07/22/2013  . Bilateral leg weakness 07/22/2013  . Hypokalemia 06/20/2013  . Diverticulitis 06/19/2013  . Nausea & vomiting 06/19/2013  . Abdominal pain 06/19/2013  . Acute diverticulitis 06/19/2013  . Postoperative stiffness of total knee replacement  04/02/2012  . Postop Hyponatremia 01/31/2012  . OA (osteoarthritis) of knee 01/30/2012       GP    Leeroy Cha 08/23/2013, 12:03 PM

## 2013-08-26 ENCOUNTER — Emergency Department (HOSPITAL_COMMUNITY): Payer: BC Managed Care – PPO

## 2013-08-26 ENCOUNTER — Encounter (HOSPITAL_COMMUNITY): Payer: Self-pay | Admitting: Emergency Medicine

## 2013-08-26 ENCOUNTER — Emergency Department (HOSPITAL_COMMUNITY)
Admission: EM | Admit: 2013-08-26 | Discharge: 2013-08-27 | Disposition: A | Discharge status: Home / Self Care | Payer: BC Managed Care – PPO | Attending: Emergency Medicine | Admitting: Emergency Medicine

## 2013-08-26 DIAGNOSIS — IMO0002 Reserved for concepts with insufficient information to code with codable children: Secondary | ICD-10-CM | POA: Insufficient documentation

## 2013-08-26 DIAGNOSIS — Z87891 Personal history of nicotine dependence: Secondary | ICD-10-CM | POA: Insufficient documentation

## 2013-08-26 DIAGNOSIS — K219 Gastro-esophageal reflux disease without esophagitis: Secondary | ICD-10-CM | POA: Insufficient documentation

## 2013-08-26 DIAGNOSIS — M1710 Unilateral primary osteoarthritis, unspecified knee: Secondary | ICD-10-CM | POA: Insufficient documentation

## 2013-08-26 DIAGNOSIS — R000 Tachycardia, unspecified: Secondary | ICD-10-CM | POA: Insufficient documentation

## 2013-08-26 DIAGNOSIS — Z7982 Long term (current) use of aspirin: Secondary | ICD-10-CM | POA: Insufficient documentation

## 2013-08-26 DIAGNOSIS — Z79899 Other long term (current) drug therapy: Secondary | ICD-10-CM | POA: Insufficient documentation

## 2013-08-26 DIAGNOSIS — J9801 Acute bronchospasm: Secondary | ICD-10-CM | POA: Insufficient documentation

## 2013-08-26 LAB — I-STAT TROPONIN, ED: TROPONIN I, POC: 0 ng/mL (ref 0.00–0.08)

## 2013-08-26 LAB — CBC
HCT: 44.3 % (ref 36.0–46.0)
Hemoglobin: 14.4 g/dL (ref 12.0–15.0)
MCH: 30 pg (ref 26.0–34.0)
MCHC: 32.5 g/dL (ref 30.0–36.0)
MCV: 92.3 fL (ref 78.0–100.0)
PLATELETS: 296 10*3/uL (ref 150–400)
RBC: 4.8 MIL/uL (ref 3.87–5.11)
RDW: 13.1 % (ref 11.5–15.5)
WBC: 9.5 10*3/uL (ref 4.0–10.5)

## 2013-08-26 LAB — BASIC METABOLIC PANEL
BUN: 11 mg/dL (ref 6–23)
CALCIUM: 9.2 mg/dL (ref 8.4–10.5)
CO2: 20 mEq/L (ref 19–32)
CREATININE: 0.65 mg/dL (ref 0.50–1.10)
Chloride: 102 mEq/L (ref 96–112)
Glucose, Bld: 129 mg/dL — ABNORMAL HIGH (ref 70–99)
Potassium: 4 mEq/L (ref 3.7–5.3)
Sodium: 140 mEq/L (ref 137–147)

## 2013-08-26 LAB — D-DIMER, QUANTITATIVE (NOT AT ARMC): D DIMER QUANT: 1.02 ug{FEU}/mL — AB (ref 0.00–0.48)

## 2013-08-26 LAB — PRO B NATRIURETIC PEPTIDE: PRO B NATRI PEPTIDE: 93.8 pg/mL (ref 0–125)

## 2013-08-26 MED ORDER — SODIUM CHLORIDE 0.9 % IV SOLN
INTRAVENOUS | Status: DC
  Administered 2013-08-26: 20:00:00 via INTRAVENOUS

## 2013-08-26 MED ORDER — IOHEXOL 350 MG/ML SOLN
100.0000 mL | Freq: Once | INTRAVENOUS | Status: AC | PRN
  Administered 2013-08-26: 100 mL via INTRAVENOUS

## 2013-08-26 NOTE — ED Notes (Signed)
Pt received solumedrol 125mg  IV given and albuterol nebs x 3 107/81 135 cbg 110, pt with NP cough

## 2013-08-26 NOTE — ED Provider Notes (Signed)
CSN: [id number]     Arrival date & time 08/26/13  1953 History   First MD Initiated Contact with Patient 08/26/13 1955     Chief Complaint  Patient presents with  . Shortness of Breath    Patient is a 58 y.o. female presenting with shortness of breath. The history is provided by the patient.  Shortness of Breath Onset quality:  Gradual Duration: Symptoms started this morning with a dry hacking cough. Timing:  Constant Progression:  Worsening Chronicity:  New Context: not smoke exposure   Relieved by:  Nothing Worsened by:  Activity Ineffective treatments: EMS gave her albuterol treatments (x3) and 125 mg solumedrol IV. Associated symptoms: chest pain, cough and wheezing   Associated symptoms: no fever and no sore throat   Associated symptoms comment:  No leg swelling, she did feel like she had the flu today  Risk factors: no hx of PE/DVT, no oral contraceptive use, no prolonged immobilization, no recent surgery and no tobacco use     Past Medical History  Diagnosis Date  . Sinus problem   . Arthritis     knees  . Hay fever   . Diverticulitis   . GERD (gastroesophageal reflux disease)   . [id number](478.2)    Past Surgical History  Procedure Laterality Date  . Abdominal hysterectomy  2003    partial  . Tonsillectomy      age 17  . Total knee arthroplasty  01/30/2012    Procedure: TOTAL KNEE ARTHROPLASTY;  Surgeon: Gearlean Alf, MD;  Location: [facility];  Service: Orthopedics;  Laterality: Left;  . Foot surgery      bilateral -bone removal  . Knee closed reduction  04/02/2012    Procedure: CLOSED MANIPULATION KNEE;  Surgeon: Gearlean Alf, MD;  Location: [facility];  Service: Orthopedics;  Laterality: Left;   History reviewed. No pertinent family history. History  Substance Use Topics  . Smoking status: Former Smoker -- 0.50 packs/day for 40 years    Types: Cigarettes    Quit date: 01/28/2012  . Smokeless tobacco: Never Used  . Alcohol Use: No   OB History   Grav Para  Term Preterm Abortions TAB SAB Ect Mult Living                 Review of Systems  Constitutional: Negative for fever.  HENT: Negative for sore throat.   Respiratory: Positive for cough, shortness of breath and wheezing.   Cardiovascular: Positive for chest pain.  All other systems reviewed and are negative.     Allergies  Dilaudid; Codeine; and Morphine and related  Home Medications   Prior to Admission medications   Medication Sig Start Date End Date Taking? Authorizing Provider  aspirin EC 81 MG tablet Take 81 mg by mouth every morning.   Yes Historical Provider, MD  diphenhydrAMINE (BENADRYL) 25 mg capsule Take 25 mg by mouth at bedtime as needed for allergies or sleep. For allergies.   Yes Historical Provider, MD  famotidine (PEPCID) 20 MG tablet Take 20 mg by mouth daily as needed for heartburn or indigestion.   Yes Historical Provider, MD  fexofenadine (ALLEGRA) 180 MG tablet Take 180 mg by mouth daily.   Yes Historical Provider, MD  Linaclotide Rolan Lipa) 145 MCG CAPS capsule Take 145 mcg by mouth daily as needed (for constipation).    Yes Monico Blitz, MD  Tetrahydrozoline HCl (VISINE OP) Place 1 drop into both eyes daily as needed.    Yes Historical Provider, MD  BP 130/73  Pulse 117  Temp(Src) 98 F (36.7 C) (Oral)  Resp 21  Ht 5\' 6"  (1.676 m)  Wt 189 lb (85.73 kg)  BMI 30.52 kg/m2  SpO2 97% Physical Exam  Nursing note and vitals reviewed. Constitutional: She appears well-developed and well-nourished. No distress.  HENT:  Head: Normocephalic and atraumatic.  Right Ear: External ear normal.  Left Ear: External ear normal.  Eyes: Conjunctivae are normal. Right eye exhibits no discharge. Left eye exhibits no discharge. No scleral icterus.  Neck: Neck supple. No tracheal deviation present.  Cardiovascular: Regular rhythm and intact distal pulses.  Tachycardia present.   Pulmonary/Chest: Accessory muscle usage present. No stridor. Tachypnea noted. No respiratory  distress. She has no decreased breath sounds. She has wheezes. She has no rales.  Abdominal: Soft. Bowel sounds are normal. She exhibits no distension. There is no tenderness. There is no rebound and no guarding.  Musculoskeletal: She exhibits no edema and no tenderness.  Neurological: She is alert. She has normal strength. No cranial nerve deficit (no facial droop, extraocular movements intact, no slurred speech) or sensory deficit. She exhibits normal muscle tone. She displays no seizure activity. Coordination normal.  Skin: Skin is warm and dry. No rash noted.  Psychiatric: She has a normal mood and affect.    ED Course  Procedures (including critical care time) Labs Review Labs Reviewed  BASIC METABOLIC PANEL - Abnormal; Notable for the following:    Glucose, Bld 129 (*)    All other components within normal limits  D-DIMER, QUANTITATIVE - Abnormal; Notable for the following:    D-Dimer, Quant 1.02 (*)    All other components within normal limits  CBC  PRO B NATRIURETIC PEPTIDE  I-STAT TROPOININ, ED    Imaging Review Dg Chest Port 1 View  08/26/2013   CLINICAL DATA:  Short of breath, chest pain and cough  EXAM: PORTABLE CHEST - 1 VIEW  COMPARISON:  Prior chest x-ray 03/29/2013  FINDINGS: Cardiac and mediastinal contours are within normal limits. Atherosclerotic calcifications are present in the transverse aorta. Inspiratory volumes are low there is mild bibasilar subsegmental atelectasis. Pulmonary vascular congestion without overt edema. No focal airspace consolidation, pleural effusion or pneumothorax. No acute osseous abnormality.  IMPRESSION: 1. Low inspiratory volumes with bibasilar atelectasis. 2. Pulmonary vascular congestion without overt edema. 3. Aortic atherosclerosis.   Electronically Signed   By: Jacqulynn Cadet M.D.   On: 08/26/2013 20:55     EKG Interpretation   Date/Time:  Monday August 26 2013 20:40:11 EDT Ventricular Rate:  128 PR Interval:  141 QRS Duration:  83 QT Interval:  323 QTC Calculation: 471 R Axis:   55 Text Interpretation:  Sinus tachycardia No previous tracing Confirmed by  Patience Nuzzo  MD-J, Daksha Koone ([id number]) on 08/26/2013 8:48:23 PM     2215  Pt is breathing more easily.  Heart rate is decreasing as well.   MDM   Final diagnoses:  None    Pt presented with bronchospasm and shortness of breath.  Some improvement with breathing treatments.  D Dimer is elevated.  Will ct to evaluate for PE.  If negative, will treat for bronchitis with bronchospasm.  Will need to recheck however pt has improved significantly and may be a candidate for outpatient treatment with close follow up    Dorie Rank, MD 08/26/13 2215

## 2013-08-26 NOTE — ED Notes (Signed)
Pt resting in bed.

## 2013-08-27 MED ORDER — ALBUTEROL SULFATE HFA 108 (90 BASE) MCG/ACT IN AERS
2.0000 | INHALATION_SPRAY | Freq: Once | RESPIRATORY_TRACT | Status: AC
  Administered 2013-08-27: 2 via RESPIRATORY_TRACT
  Filled 2013-08-27: qty 6.7

## 2013-08-27 MED ORDER — PREDNISONE 10 MG PO TABS
ORAL_TABLET | ORAL | Status: AC
  Administered 2013-08-27: 01:00:00
  Filled 2013-08-27: qty 1

## 2013-08-27 MED ORDER — PREDNISONE 50 MG PO TABS
ORAL_TABLET | ORAL | Status: AC
  Administered 2013-08-27: 01:00:00
  Filled 2013-08-27: qty 1

## 2013-08-27 MED ORDER — PREDNISONE 20 MG PO TABS: ORAL_TABLET | ORAL | Status: DC

## 2013-08-27 MED ORDER — PREDNISONE 50 MG PO TABS
60.0000 mg | ORAL_TABLET | Freq: Once | ORAL | Status: DC
  Filled 2013-08-27 (×2): qty 1

## 2013-08-27 NOTE — ED Provider Notes (Signed)
Patient feels much better in the emergency department feels ready for discharge. 1540  Babette Relic, MD 08/29/13 [phone]

## 2013-08-27 NOTE — Discharge Instructions (Signed)
Bronchospasm, Adult A bronchospasm is when the tubes that carry air in and out of your lungs (airwarys) spasm or tighten. During a bronchospasm it is hard to breathe. This is because the airways get smaller. A bronchospasm can be triggered by:  Allergies. These may be to animals, pollen, food, or mold.  Infection. This is a common cause of bronchospasm.  Exercise.  Irritants. These include pollution, cigarette smoke, strong odors, aerosol sprays, and paint fumes.  Weather changes.  Stress.  Being emotional. HOME CARE   Always have a plan for getting help. Know when to call your doctor and local emergency services (911 in the [place] Know where you can get emergency care.  Only take medicines as told by your doctor.  If you were prescribed an inhaler or nebulizer machine, ask your doctor how to use it correctly. Always use a spacer with your inhaler if you were given one.  Stay calm during an attack. Try to relax and breathe more slowly.  Control your home environment:  Change your heating and air conditioning filter at least once a month.  Limit your use of fireplaces and wood stoves.  Do not  smoke. Do not  allow smoking in your home.  Avoid perfumes and fragrances.  Get rid of pests (such as roaches and mice) and their droppings.  Throw away plants if you see mold on them.  Keep your house clean and dust free.  Replace carpet with wood, tile, or vinyl flooring. Carpet can trap dander and dust.  Use allergy-proof pillows, mattress covers, and box spring covers.  Wash bed sheets and blankets every week in hot water. Dry them in a dryer.  Use blankets that are made of polyester or cotton.  Wash hands frequently. GET HELP IF:  You have muscle aches.  You have chest pain.  The thick spit you spit or cough up (sputum) changes from clear or white to yellow, green, gray, or bloody.  The thick spit you spit or cough up gets thicker.  There are problems that may be  related to the medicine you are given such as:  A rash.  Itching.  Swelling.  Trouble breathing. GET HELP RIGHT AWAY IF:  You feel you cannot breathe or catch your breath.  You cannot stop coughing.  Your treatment is not helping you breathe better. MAKE SURE YOU:   Understand these instructions.  Will watch your condition.  Will get help right away if you are not doing well or get worse. Document Released: 01/09/2009 Document Revised: 11/14/2012 Document Reviewed: 09/04/2012 [facility] Patient Information 2014 [place].   RETURN IMMEDIATELY IF you develop worse shortness of breath, confusion or altered mental status, a new rash, become dizzy, faint, or poorly responsive, or are unable to be cared for at home.

## 2013-08-28 ENCOUNTER — Ambulatory Visit (HOSPITAL_COMMUNITY): Payer: BC Managed Care – PPO | Admitting: Physical Therapy

## 2013-08-28 ENCOUNTER — Telehealth (HOSPITAL_COMMUNITY): Payer: Self-pay

## 2013-08-30 ENCOUNTER — Ambulatory Visit (HOSPITAL_COMMUNITY): Payer: BC Managed Care – PPO

## 2013-09-02 ENCOUNTER — Ambulatory Visit (HOSPITAL_COMMUNITY): Payer: BC Managed Care – PPO | Admitting: Physical Therapy

## 2013-09-04 ENCOUNTER — Ambulatory Visit (HOSPITAL_COMMUNITY): Payer: BC Managed Care – PPO | Admitting: Physical Therapy

## 2013-09-06 ENCOUNTER — Ambulatory Visit (HOSPITAL_COMMUNITY): Payer: BC Managed Care – PPO | Admitting: Physical Therapy

## 2013-09-09 ENCOUNTER — Ambulatory Visit (HOSPITAL_COMMUNITY): Payer: BC Managed Care – PPO | Admitting: Physical Therapy

## 2013-09-11 ENCOUNTER — Ambulatory Visit (HOSPITAL_COMMUNITY): Payer: BC Managed Care – PPO | Admitting: Physical Therapy

## 2013-09-13 ENCOUNTER — Ambulatory Visit (HOSPITAL_COMMUNITY): Payer: BC Managed Care – PPO

## 2014-04-28 ENCOUNTER — Inpatient Hospital Stay (HOSPITAL_COMMUNITY)
Admission: EM | Admit: 2014-04-28 | Discharge: 2014-04-30 | DRG: 392 | Disposition: A | Discharge status: Home / Self Care | Payer: Medicare Other | Attending: Internal Medicine | Admitting: Internal Medicine

## 2014-04-28 ENCOUNTER — Inpatient Hospital Stay (HOSPITAL_COMMUNITY): Payer: Medicare Other

## 2014-04-28 ENCOUNTER — Emergency Department (HOSPITAL_COMMUNITY): Payer: Medicare Other

## 2014-04-28 ENCOUNTER — Encounter (HOSPITAL_COMMUNITY): Payer: Self-pay | Admitting: *Deleted

## 2014-04-28 DIAGNOSIS — Z96652 Presence of left artificial knee joint: Secondary | ICD-10-CM | POA: Diagnosis present

## 2014-04-28 DIAGNOSIS — Z90710 Acquired absence of both cervix and uterus: Secondary | ICD-10-CM | POA: Diagnosis not present

## 2014-04-28 DIAGNOSIS — M1990 Unspecified osteoarthritis, unspecified site: Secondary | ICD-10-CM | POA: Diagnosis present

## 2014-04-28 DIAGNOSIS — Z7952 Long term (current) use of systemic steroids: Secondary | ICD-10-CM | POA: Diagnosis not present

## 2014-04-28 DIAGNOSIS — R112 Nausea with vomiting, unspecified: Secondary | ICD-10-CM | POA: Diagnosis present

## 2014-04-28 DIAGNOSIS — K219 Gastro-esophageal reflux disease without esophagitis: Secondary | ICD-10-CM | POA: Diagnosis present

## 2014-04-28 DIAGNOSIS — K5900 Constipation, unspecified: Secondary | ICD-10-CM | POA: Diagnosis present

## 2014-04-28 DIAGNOSIS — K529 Noninfective gastroenteritis and colitis, unspecified: Principal | ICD-10-CM

## 2014-04-28 DIAGNOSIS — Z87891 Personal history of nicotine dependence: Secondary | ICD-10-CM

## 2014-04-28 DIAGNOSIS — E860 Dehydration: Secondary | ICD-10-CM | POA: Diagnosis present

## 2014-04-28 DIAGNOSIS — R1032 Left lower quadrant pain: Secondary | ICD-10-CM

## 2014-04-28 DIAGNOSIS — K581 Irritable bowel syndrome with constipation: Secondary | ICD-10-CM | POA: Diagnosis present

## 2014-04-28 DIAGNOSIS — E872 Acidosis, unspecified: Secondary | ICD-10-CM

## 2014-04-28 DIAGNOSIS — E876 Hypokalemia: Secondary | ICD-10-CM | POA: Diagnosis present

## 2014-04-28 DIAGNOSIS — R109 Unspecified abdominal pain: Secondary | ICD-10-CM | POA: Diagnosis not present

## 2014-04-28 DIAGNOSIS — R197 Diarrhea, unspecified: Secondary | ICD-10-CM

## 2014-04-28 DIAGNOSIS — R110 Nausea: Secondary | ICD-10-CM

## 2014-04-28 DIAGNOSIS — K589 Irritable bowel syndrome without diarrhea: Secondary | ICD-10-CM | POA: Diagnosis present

## 2014-04-28 DIAGNOSIS — R000 Tachycardia, unspecified: Secondary | ICD-10-CM | POA: Diagnosis present

## 2014-04-28 DIAGNOSIS — Z7982 Long term (current) use of aspirin: Secondary | ICD-10-CM | POA: Diagnosis not present

## 2014-04-28 HISTORY — DX: Diverticulosis of intestine, part unspecified, without perforation or abscess without bleeding: K57.90

## 2014-04-28 HISTORY — DX: Noninfective gastroenteritis and colitis, unspecified: K52.9

## 2014-04-28 HISTORY — DX: Constipation, unspecified: K59.00

## 2014-04-28 LAB — URINALYSIS, ROUTINE W REFLEX MICROSCOPIC
Bilirubin Urine: NEGATIVE
Glucose, UA: NEGATIVE mg/dL
Hgb urine dipstick: NEGATIVE
Ketones, ur: NEGATIVE mg/dL
Leukocytes, UA: NEGATIVE
NITRITE: NEGATIVE
PROTEIN: NEGATIVE mg/dL
SPECIFIC GRAVITY, URINE: 1.025 (ref 1.005–1.030)
UROBILINOGEN UA: 0.2 mg/dL (ref 0.0–1.0)
pH: 6 (ref 5.0–8.0)

## 2014-04-28 LAB — CBC WITH DIFFERENTIAL/PLATELET
BASOS ABS: 0 10*3/uL (ref 0.0–0.1)
Basophils Relative: 0 % (ref 0–1)
EOS PCT: 2 % (ref 0–5)
Eosinophils Absolute: 0.2 10*3/uL (ref 0.0–0.7)
HCT: 43.7 % (ref 36.0–46.0)
HEMOGLOBIN: 14.3 g/dL (ref 12.0–15.0)
LYMPHS ABS: 2.1 10*3/uL (ref 0.7–4.0)
Lymphocytes Relative: 18 % (ref 12–46)
MCH: 30.4 pg (ref 26.0–34.0)
MCHC: 32.7 g/dL (ref 30.0–36.0)
MCV: 92.8 fL (ref 78.0–100.0)
MONOS PCT: 7 % (ref 3–12)
Monocytes Absolute: 0.8 10*3/uL (ref 0.1–1.0)
Neutro Abs: 8.7 10*3/uL — ABNORMAL HIGH (ref 1.7–7.7)
Neutrophils Relative %: 73 % (ref 43–77)
Platelets: 297 10*3/uL (ref 150–400)
RBC: 4.71 MIL/uL (ref 3.87–5.11)
RDW: 14 % (ref 11.5–15.5)
WBC: 11.8 10*3/uL — ABNORMAL HIGH (ref 4.0–10.5)

## 2014-04-28 LAB — COMPREHENSIVE METABOLIC PANEL
ALBUMIN: 3.8 g/dL (ref 3.5–5.2)
ALT: 23 U/L (ref 0–35)
AST: 22 U/L (ref 0–37)
Alkaline Phosphatase: 127 U/L — ABNORMAL HIGH (ref 39–117)
Anion gap: 6 (ref 5–15)
BUN: 16 mg/dL (ref 6–23)
CO2: 24 mmol/L (ref 19–32)
Calcium: 9 mg/dL (ref 8.4–10.5)
Chloride: 107 mmol/L (ref 96–112)
Creatinine, Ser: 0.85 mg/dL (ref 0.50–1.10)
GFR calc Af Amer: 86 mL/min — ABNORMAL LOW (ref >90)
GFR, EST NON AFRICAN AMERICAN: 75 mL/min — AB (ref >90)
Glucose, Bld: 115 mg/dL — ABNORMAL HIGH (ref 70–99)
Potassium: 4.1 mmol/L (ref 3.5–5.1)
Sodium: 137 mmol/L (ref 135–145)
Total Bilirubin: 0.6 mg/dL (ref 0.3–1.2)
Total Protein: 7.2 g/dL (ref 6.0–8.3)

## 2014-04-28 LAB — LACTIC ACID, PLASMA: Lactic Acid, Venous: 2.9 mmol/L (ref 0.5–2.0)

## 2014-04-28 LAB — MRSA PCR SCREENING: MRSA by PCR: NEGATIVE

## 2014-04-28 MED ORDER — FENTANYL CITRATE 0.05 MG/ML IJ SOLN
50.0000 ug | Freq: Once | INTRAMUSCULAR | Status: AC
  Administered 2014-04-28: 50 ug via INTRAVENOUS
  Filled 2014-04-28: qty 2

## 2014-04-28 MED ORDER — SODIUM CHLORIDE 0.9 % IV SOLN
1000.0000 mL | Freq: Once | INTRAVENOUS | Status: AC
  Administered 2014-04-28: 1000 mL via INTRAVENOUS

## 2014-04-28 MED ORDER — ENOXAPARIN SODIUM 40 MG/0.4ML ~~LOC~~ SOLN
40.0000 mg | SUBCUTANEOUS | Status: DC
  Administered 2014-04-28 – 2014-04-30 (×3): 40 mg via SUBCUTANEOUS
  Filled 2014-04-28 (×3): qty 0.4

## 2014-04-28 MED ORDER — IOHEXOL 300 MG/ML  SOLN
50.0000 mL | Freq: Once | INTRAMUSCULAR | Status: AC | PRN
  Administered 2014-04-28: 50 mL via ORAL

## 2014-04-28 MED ORDER — FENTANYL CITRATE 0.05 MG/ML IJ SOLN: 50.0000 ug | INTRAMUSCULAR | Status: DC | PRN

## 2014-04-28 MED ORDER — MORPHINE SULFATE 2 MG/ML IJ SOLN
1.0000 mg | INTRAMUSCULAR | Status: DC | PRN
  Administered 2014-04-28 (×2): 1 mg via INTRAVENOUS
  Filled 2014-04-28 (×2): qty 1

## 2014-04-28 MED ORDER — SODIUM CHLORIDE 0.9 % IV SOLN
INTRAVENOUS | Status: DC
  Administered 2014-04-28 (×2): via INTRAVENOUS
  Administered 2014-04-29: 1 mL via INTRAVENOUS

## 2014-04-28 MED ORDER — METRONIDAZOLE IN NACL 5-0.79 MG/ML-% IV SOLN
500.0000 mg | Freq: Once | INTRAVENOUS | Status: AC
  Administered 2014-04-28: 500 mg via INTRAVENOUS
  Filled 2014-04-28: qty 100

## 2014-04-28 MED ORDER — ONDANSETRON HCL 4 MG/2ML IJ SOLN
4.0000 mg | Freq: Once | INTRAMUSCULAR | Status: AC
  Administered 2014-04-28: 4 mg via INTRAVENOUS
  Filled 2014-04-28: qty 2

## 2014-04-28 MED ORDER — ONDANSETRON HCL 4 MG/2ML IJ SOLN: 4.0000 mg | Freq: Four times a day (QID) | INTRAMUSCULAR | Status: DC | PRN

## 2014-04-28 MED ORDER — ACETAMINOPHEN 650 MG RE SUPP: 650.0000 mg | Freq: Four times a day (QID) | RECTAL | Status: DC | PRN

## 2014-04-28 MED ORDER — ONDANSETRON HCL 4 MG PO TABS: 4.0000 mg | ORAL_TABLET | Freq: Four times a day (QID) | ORAL | Status: DC | PRN

## 2014-04-28 MED ORDER — METRONIDAZOLE IN NACL 5-0.79 MG/ML-% IV SOLN
500.0000 mg | Freq: Three times a day (TID) | INTRAVENOUS | Status: DC
  Administered 2014-04-28 – 2014-04-30 (×6): 500 mg via INTRAVENOUS
  Filled 2014-04-28 (×6): qty 100

## 2014-04-28 MED ORDER — ALUM & MAG HYDROXIDE-SIMETH 200-200-20 MG/5ML PO SUSP
30.0000 mL | Freq: Four times a day (QID) | ORAL | Status: DC | PRN
  Filled 2014-04-28: qty 30

## 2014-04-28 MED ORDER — SODIUM CHLORIDE 0.9 % IV BOLUS (SEPSIS)
1000.0000 mL | Freq: Once | INTRAVENOUS | Status: AC
  Administered 2014-04-28: 1000 mL via INTRAVENOUS

## 2014-04-28 MED ORDER — CIPROFLOXACIN IN D5W 400 MG/200ML IV SOLN
400.0000 mg | Freq: Two times a day (BID) | INTRAVENOUS | Status: DC
  Administered 2014-04-28 – 2014-04-30 (×4): 400 mg via INTRAVENOUS
  Filled 2014-04-28 (×4): qty 200

## 2014-04-28 MED ORDER — MORPHINE SULFATE 4 MG/ML IJ SOLN
4.0000 mg | Freq: Once | INTRAMUSCULAR | Status: DC
  Filled 2014-04-28: qty 1

## 2014-04-28 MED ORDER — SODIUM CHLORIDE 0.9 % IV SOLN
1000.0000 mL | INTRAVENOUS | Status: DC
  Administered 2014-04-28: 1000 mL via INTRAVENOUS

## 2014-04-28 MED ORDER — ONDANSETRON HCL 4 MG/2ML IJ SOLN: 4.0000 mg | Freq: Three times a day (TID) | INTRAMUSCULAR | Status: AC | PRN

## 2014-04-28 MED ORDER — IOHEXOL 300 MG/ML  SOLN
100.0000 mL | Freq: Once | INTRAMUSCULAR | Status: AC | PRN
  Administered 2014-04-28: 100 mL via INTRAVENOUS

## 2014-04-28 MED ORDER — CIPROFLOXACIN IN D5W 400 MG/200ML IV SOLN
400.0000 mg | Freq: Once | INTRAVENOUS | Status: AC
  Administered 2014-04-28: 400 mg via INTRAVENOUS
  Filled 2014-04-28: qty 200

## 2014-04-28 MED ORDER — ACETAMINOPHEN 325 MG PO TABS: 650.0000 mg | ORAL_TABLET | Freq: Four times a day (QID) | ORAL | Status: DC | PRN

## 2014-04-28 NOTE — H&P (Signed)
[facility] History and Physical  Holly [name] OMV:[id number] DOB: 1955-09-30 DOA: 04/28/2014  Referring physician:  PCP: Monico Blitz, MD   Chief Complaint: abdominal pain  HPI: Holly [name] is a 59 y.o. female permanent with the chief complaint of abdominal pain. Initial evaluation reveals enteritis. Patient reports that 3 days ago she developed sudden abdominal pain. She reports quite similar to pain she experienced during a diverticulitis attack. Describes the pain is sharp constant located mostly in the lower quadrants. Associated symptoms include nausea with intermittent vomiting and frequent loose stools. She denies bloody stools or melena. Reports able to keep ginger ale down but no solid foods. She said she took Imodium for the diarrhea with good results and then began feeling constipated and bloated. Reports pain became so intense she came to the emergency department. He denies chest pain palpitation headache fever chills syncope or near-syncope. He denies dysuria hematuria frequency or urgency. Workup in the emergency department notes complete metabolic panel is unremarkable except for serum glucose of 115, complete blood count with a WBC 11.8, chest x-ray with no definite acute pulmonary process. Questionable hazy opacity at the left lung base likely soft tissue attenuation. CT of the abdomen abnormal small bowel loop in the lower abdomen most consistent with enteritis. Findings suggest an inflammatory or infectious etiology. Colonic diverticulosis. There is mild fat stranding adjacent to the sigmoid colon in the left lower quadrant and appears similar to prior exam, may reflect sequela of prior diverticulitis. There is no evidence of acute inflammation at this time. In the emergency department she is afebrile and hemodynamically stable with a heart rate of 100 no hypoxia. She is provided with IV fluids pain medicine Cipro and Flagyl. Review of Systems:  10 point review of systems  complete and all systems are negative except as indicated in the history of present illness Past Medical History  Diagnosis Date  . Sinus problem   . Arthritis     knees  . Hay fever   . Diverticulitis   . GERD (gastroesophageal reflux disease)   . Headache(784.0)   . Diverticulosis   . Enteritis    Past Surgical History  Procedure Laterality Date  . Abdominal hysterectomy  2003    partial  . Tonsillectomy      age 60  . Total knee arthroplasty  01/30/2012    Procedure: TOTAL KNEE ARTHROPLASTY;  Surgeon: Gearlean Alf, MD;  Location: [facility];  Service: Orthopedics;  Laterality: Left;  . Foot surgery      bilateral -bone removal  . Knee closed reduction  04/02/2012    Procedure: CLOSED MANIPULATION KNEE;  Surgeon: Gearlean Alf, MD;  Location: [facility];  Service: Orthopedics;  Laterality: Left;   Social History:  reports that she quit smoking about 2 years ago. Her smoking use included Cigarettes. She has a 20 pack-year smoking history. She has never used smokeless tobacco. She reports that she does not drink alcohol or use illicit drugs. She is married lives at home with her husband has been on disability since her knee replacement in 2015 she is independent with ADLs Allergies  Allergen Reactions  . Dilaudid [Hydromorphone Hcl] Nausea And Vomiting  . Codeine Nausea And Vomiting    fever  . Morphine And Related Nausea And Vomiting    fever    History reviewed. No pertinent family history. mother with a history of CAD mother with a history of diabetes. He is unsure of her siblings medical background  Prior  to Admission medications   Medication Sig Start Date End Date Taking? Authorizing Provider  aspirin EC 81 MG tablet Take 81 mg by mouth every morning.    Historical Provider, MD  diphenhydrAMINE (BENADRYL) 25 mg capsule Take 25 mg by mouth at bedtime as needed for allergies or sleep. For allergies.    Historical Provider, MD  famotidine (PEPCID) 20 MG tablet Take 20 mg by  mouth daily as needed for heartburn or indigestion.    Historical Provider, MD  fexofenadine (ALLEGRA) 180 MG tablet Take 180 mg by mouth daily.    Historical Provider, MD  Linaclotide Rolan Lipa) 145 MCG CAPS capsule Take 145 mcg by mouth daily as needed (for constipation).     Monico Blitz, MD  predniSONE (DELTASONE) 20 MG tablet 2 tabs po daily x 4 days 08/27/13   Babette Relic, MD  Tetrahydrozoline HCl (VISINE OP) Place 1 drop into both eyes daily as needed.     Historical Provider, MD   Physical Exam: Filed Vitals:   04/28/14 0024 04/28/14 0353 04/28/14 0707  BP: 99/74 118/64 117/70  Pulse: 114 119 110  Temp: 98.2 F (36.8 C) 98.7 F (37.1 C) 98 F (36.7 C)  TempSrc: Oral  Oral  Resp: 20 24 22   Height: 5\' 3"  (1.6 m)    Weight: 97.07 kg (214 lb)  98.2 kg (216 lb 7.9 oz)  SpO2: 99% 95% 95%    Wt Readings from Last 3 Encounters:  04/28/14 98.2 kg (216 lb 7.9 oz)  08/26/13 85.73 kg (189 lb)  06/21/13 91.173 kg (201 lb)    General:  Appears calm and comfortable, somewhat lethargic Eyes: PERRL, normal lids, irises & conjunctiva ENT: grossly normal hearing, lips & tongue is membranes of her mouth are moist and pink Neck: no LAD, masses or thyromegaly Cardiovascular: RRR, no m/r/g. No LE edema. Respiratory: CTA bilaterally, no w/r/r. Breath sounds somewhat distant Normal respiratory effort. Abdomen: soft, obese, very sluggish bowel sounds, mild tenderness particularly in the lower quadrants on the left Skin: no rash or induration seen on limited exam Musculoskeletal: grossly normal tone BUE/BLE Psychiatric: grossly normal mood and affect, speech fluent and appropriate Neurologic: grossly non-focal. Speech clear facial symmetry           Labs on Admission:  Basic Metabolic Panel:  Recent Labs Lab 04/28/14 0050  NA 137  K 4.1  CL 107  CO2 24  GLUCOSE 115*  BUN 16  CREATININE 0.85  CALCIUM 9.0   Liver Function Tests:  Recent Labs Lab 04/28/14 0050  AST 22  ALT 23    ALKPHOS 127*  BILITOT 0.6  PROT 7.2  ALBUMIN 3.8   No results for input(s): LIPASE, AMYLASE in the last 168 hours. No results for input(s): AMMONIA in the last 168 hours. CBC:  Recent Labs Lab 04/28/14 0050  WBC 11.8*  NEUTROABS 8.7*  HGB 14.3  HCT 43.7  MCV 92.8  PLT 297   Cardiac Enzymes: No results for input(s): CKTOTAL, CKMB, CKMBINDEX, TROPONINI in the last 168 hours.  BNP (last 3 results)  Recent Labs  08/26/13 2005  PROBNP 93.8   CBG: No results for input(s): GLUCAP in the last 168 hours.  Radiological Exams on Admission: Ct Abdomen Pelvis W Contrast  04/28/2014   CLINICAL DATA:  Worsening left lower quad abdominal pain, diarrhea. History of diverticulitis.  EXAM: CT ABDOMEN AND PELVIS WITH CONTRAST  TECHNIQUE: Multidetector CT imaging of the abdomen and pelvis was performed using the standard protocol following  bolus administration of intravenous contrast.  CONTRAST:  8mL OMNIPAQUE IOHEXOL 300 MG/ML SOLN, 126mL OMNIPAQUE IOHEXOL 300 MG/ML SOLN  COMPARISON:  CT 06/19/2013  FINDINGS: The lung bases are clear.  There is a moderate length segment of small bowel in the central lower abdomen demonstrating bowel wall thickening and mild associated mesenteric edema. No bowel dilatation or perforation. Some oral contrast reaches distal to this. Multiple colonic diverticula again seen in the descending and sigmoid colon. Persistent but decreased fat stranding adjacent to diverticula in the sigmoid colon in a distribution similar to that of prior. The appendix is normal.  The liver, spleen, pancreas, gallbladder, and adrenal glands are normal. There is symmetric renal enhancement and excretion. Bilateral renal cysts are again seen, largest in the peripelvic left kidney. There is no hydronephrosis or perinephric stranding.  No free air, free fluid, or intra-abdominal fluid collection. The abdominal aorta is normal in caliber. There is no retroperitoneal adenopathy.  Within the pelvis  the urinary bladder is physiologically distended. The uterus is surgically absent. There are coarse calcifications in both adnexa that are similar to prior exam. No pelvic free fluid. There are no acute or suspicious osseous abnormalities.  IMPRESSION: 1. Abnormal small bowel loop in the lower abdomen most consistent with enteritis. Findings suggest an inflammatory or infectious etiology. 2. Colonic diverticulosis. There is mild fat stranding adjacent to the sigmoid colon in the left lower quadrant and appears similar to prior exam, may reflect sequela of prior diverticulitis. There is no evidence of acute inflammation at this time. 3. Incidental findings of bilateral renal cysts.   Electronically Signed   By: Jeb Levering M.D.   On: 04/28/2014 03:16   Dg Chest Portable 1 View  04/28/2014   CLINICAL DATA:  Weakness, wheezing, shortness of breath. Abdominal pain.  EXAM: PORTABLE CHEST - 1 VIEW  COMPARISON:  Radiographs and CT 08/26/2013  FINDINGS: Lung volumes remain low. The cardiomediastinal contours are unchanged. Hazy opacity at the left lung base is likely related to soft tissue attenuation. Mild vascular congestion, similar to prior exam. No confluent airspace disease to suggest pneumonia. There is no pleural effusion or pneumothorax. No acute osseous abnormalities are seen.  IMPRESSION: Hypoventilatory chest. No definite acute pulmonary process. Questionable hazy opacity at the left lung bases likely soft tissue attenuation.   Electronically Signed   By: Jeb Levering M.D.   On: 04/28/2014 05:55    EKG:   Assessment/Plan Principal Problem:   Abdominal pain: Likely related to enteritis. Will continue supportive therapy. Pain medicine and antiemetic as indicated. IV fluids clear liquid diet Active Problems: Enteritis: She is afebrile and nontoxic appearing. No indication of complicating factors. Will provide clear liquids Cipro and Flagyl. Monitor    Nausea & vomiting: See above.  Sinus  tachycardia: Likely related to hydration as a result of above. IV fluids as indicated.     Code Status: full DVT Prophylaxis: Family Communication: none present Disposition Plan: home when ready  Time spent: 81 minutes  [facility] Pager [phone]

## 2014-04-28 NOTE — ED Notes (Signed)
Pt up to the restroom

## 2014-04-28 NOTE — ED Provider Notes (Signed)
CSN: [id number]     Arrival date & time 04/28/14  0007 History   First MD Initiated Contact with Patient 04/28/14 0035     Chief Complaint  Patient presents with  . Abdominal Pain     (Consider location/radiation/quality/duration/timing/severity/associated sxs/prior Treatment) HPI  Patient reports she has a history of diverticulitis and she gets a flareup about once a year. She states January 29 she started with diarrhea at 5 in the morning that she states was loose. She had nausea but no vomiting but states she was having gagging. On January 30 she continued to have diarrhea but felt like she was constipated. On January 31 she no longer had the diarrhea but felt like she was constipated and she needed to have a bowel movement but couldn't. She also states her abdomen started swelling. She reports abdominal pain mainly in her left lower quadrant in her right lower quadrant that started today and has been getting progressively worse. She denies diarrhea or vomiting today. She does report decreased appetite. She had chills today but has not checked for fever. She states she feels lightheaded and weak and her symptoms are worse when she stands up. Her husband states she took Imodium for her diarrhea which he had warned her against taking it.  PCP Dr Manuella Ghazi Denies having GI  Past Medical History  Diagnosis Date  . Sinus problem   . Arthritis     knees  . Hay fever   . Diverticulitis   . GERD (gastroesophageal reflux disease)   . VZDGLOVF(643.3)    Past Surgical History  Procedure Laterality Date  . Abdominal hysterectomy  2003    partial  . Tonsillectomy      age 38  . Total knee arthroplasty  01/30/2012    Procedure: TOTAL KNEE ARTHROPLASTY;  Surgeon: Gearlean Alf, MD;  Location: [facility];  Service: Orthopedics;  Laterality: Left;  . Foot surgery      bilateral -bone removal  . Knee closed reduction  04/02/2012    Procedure: CLOSED MANIPULATION KNEE;  Surgeon: Gearlean Alf, MD;   Location: [facility];  Service: Orthopedics;  Laterality: Left;   History reviewed. No pertinent family history. History  Substance Use Topics  . Smoking status: Former Smoker -- 0.50 packs/day for 40 years    Types: Cigarettes    Quit date: 01/28/2012  . Smokeless tobacco: Never Used  . Alcohol Use: No  drinks red wine occassionaly Lives with spouse On disability after Lt TKR in 2013   OB History    No data available     Review of Systems  All other systems reviewed and are negative.     Allergies  Dilaudid; Codeine; and Morphine and related  Home Medications   Prior to Admission medications   Medication Sig Start Date End Date Taking? Authorizing Provider  aspirin EC 81 MG tablet Take 81 mg by mouth every morning.    Historical Provider, MD  diphenhydrAMINE (BENADRYL) 25 mg capsule Take 25 mg by mouth at bedtime as needed for allergies or sleep. For allergies.    Historical Provider, MD  famotidine (PEPCID) 20 MG tablet Take 20 mg by mouth daily as needed for heartburn or indigestion.    Historical Provider, MD  fexofenadine (ALLEGRA) 180 MG tablet Take 180 mg by mouth daily.    Historical Provider, MD  Linaclotide Rolan Lipa) 145 MCG CAPS capsule Take 145 mcg by mouth daily as needed (for constipation).     Monico Blitz, MD  predniSONE (DELTASONE) 20 MG tablet 2 tabs po daily x 4 days 08/27/13   Babette Relic, MD  Tetrahydrozoline HCl (VISINE OP) Place 1 drop into both eyes daily as needed.     Historical Provider, MD   BP 99/74 mmHg  Pulse 114  Temp(Src) 98.2 F (36.8 C) (Oral)  Resp 20  Ht 5\' 3"  (1.6 m)  Wt 214 lb (97.07 kg)  BMI 37.92 kg/m2  SpO2 99%  Vital signs normal except for borderline blood pressure and tachycardia   Physical Exam  Constitutional: She is oriented to person, place, and time. She appears well-developed and well-nourished.  Non-toxic appearance. She does not appear ill. She appears distressed.  HENT:  Head: Normocephalic and atraumatic.    Right Ear: External ear normal.  Left Ear: External ear normal.  Nose: Nose normal. No mucosal edema or rhinorrhea.  Mouth/Throat: Mucous membranes are dry. No dental abscesses or uvula swelling.  Eyes: Conjunctivae and EOM are normal. Pupils are equal, round, and reactive to light.  Neck: Normal range of motion and full passive range of motion without pain. Neck supple.  Cardiovascular: Normal rate, regular rhythm and normal heart sounds.  Exam reveals no gallop and no friction rub.   No murmur heard. Pulmonary/Chest: Effort normal and breath sounds normal. No respiratory distress. She has no wheezes. She has no rhonchi. She has no rales. She exhibits no tenderness and no crepitus.  Abdominal: Soft. Normal appearance and bowel sounds are normal. She exhibits distension. There is tenderness in the right lower quadrant and left lower quadrant. There is no rebound and no guarding.    Patient has moderate tenderness to palpation of her left lower quadrant with mild guarding but no rebound. She also has some tenderness in the right lower quadrant.  Musculoskeletal: Normal range of motion. She exhibits no edema or tenderness.  Moves all extremities well.   Neurological: She is alert and oriented to person, place, and time. She has normal strength. No cranial nerve deficit.  Skin: Skin is warm, dry and intact. No rash noted. No erythema. No pallor.  Psychiatric: Her speech is normal and behavior is normal. Her mood appears not anxious.  Tearful  Nursing note and vitals reviewed.   ED Course  Procedures (including critical care time)  Medications  0.9 %  sodium chloride infusion (0 mLs Intravenous Stopped 04/28/14 0502)    Followed by  0.9 %  sodium chloride infusion (0 mLs Intravenous Stopped 04/28/14 0502)    Followed by  0.9 %  sodium chloride infusion (0 mLs Intravenous Stopped 04/28/14 0359)  morphine 4 MG/ML injection 4 mg (4 mg Intravenous Not Given 04/28/14 0138)  metroNIDAZOLE (FLAGYL)  IVPB 500 mg (500 mg Intravenous New Bag/Given 04/28/14 0502)  ciprofloxacin (CIPRO) IVPB 400 mg (400 mg Intravenous New Bag/Given 04/28/14 0503)  ondansetron (ZOFRAN) injection 4 mg (4 mg Intravenous Given 04/28/14 0138)  iohexol (OMNIPAQUE) 300 MG/ML solution 50 mL (50 mLs Oral Contrast Given 04/28/14 0124)  fentaNYL (SUBLIMAZE) injection 50 mcg (50 mcg Intravenous Given 04/28/14 0150)  iohexol (OMNIPAQUE) 300 MG/ML solution 100 mL (100 mLs Intravenous Contrast Given 04/28/14 0243)  sodium chloride 0.9 % bolus 1,000 mL (0 mLs Intravenous Stopped 04/28/14 0502)   Patient was started on IV fluids for her dehydration, IV pain medicine, IV nausea medicine. CT scan of her abdomen was ordered for possible diverticulitis with or without perforation.  0400 patient was rechecked. She is getting her IV fluids. She states she's a little better  but still has a lot of discomfort. She states she feels like she cannot go home. Patient and her husband were given her test results. She was started on IV antibiotics for her possible infectious inflammatory bowel enteritis.  05:11 Dr Gasper Lloyd, admit to [facility] Review Results for orders placed or performed during the hospital encounter of 04/28/14  Comprehensive metabolic panel  Result Value Ref Range   Sodium 137 135 - 145 mmol/L   Potassium 4.1 3.5 - 5.1 mmol/L   Chloride 107 96 - 112 mmol/L   CO2 24 19 - 32 mmol/L   Glucose, Bld 115 (H) 70 - 99 mg/dL   BUN 16 6 - 23 mg/dL   Creatinine, Ser 0.85 0.50 - 1.10 mg/dL   Calcium 9.0 8.4 - 10.5 mg/dL   Total Protein 7.2 6.0 - 8.3 g/dL   Albumin 3.8 3.5 - 5.2 g/dL   AST 22 0 - 37 U/L   ALT 23 0 - 35 U/L   Alkaline Phosphatase 127 (H) 39 - 117 U/L   Total Bilirubin 0.6 0.3 - 1.2 mg/dL   GFR calc non Af Amer 75 (L) >90 mL/min   GFR calc Af Amer 86 (L) >90 mL/min   Anion gap 6 5 - 15  CBC with Differential  Result Value Ref Range   WBC 11.8 (H) 4.0 - 10.5 K/uL   RBC 4.71 3.87 - 5.11 MIL/uL   Hemoglobin 14.3  12.0 - 15.0 g/dL   HCT 43.7 36.0 - 46.0 %   MCV 92.8 78.0 - 100.0 fL   MCH 30.4 26.0 - 34.0 pg   MCHC 32.7 30.0 - 36.0 g/dL   RDW 14.0 11.5 - 15.5 %   Platelets 297 150 - 400 K/uL   Neutrophils Relative % 73 43 - 77 %   Neutro Abs 8.7 (H) 1.7 - 7.7 K/uL   Lymphocytes Relative 18 12 - 46 %   Lymphs Abs 2.1 0.7 - 4.0 K/uL   Monocytes Relative 7 3 - 12 %   Monocytes Absolute 0.8 0.1 - 1.0 K/uL   Eosinophils Relative 2 0 - 5 %   Eosinophils Absolute 0.2 0.0 - 0.7 K/uL   Basophils Relative 0 0 - 1 %   Basophils Absolute 0.0 0.0 - 0.1 K/uL  Urinalysis, Routine w reflex microscopic  Result Value Ref Range   Color, Urine YELLOW YELLOW   APPearance CLEAR CLEAR   Specific Gravity, Urine 1.025 1.005 - 1.030   pH 6.0 5.0 - 8.0   Glucose, UA NEGATIVE NEGATIVE mg/dL   Hgb urine dipstick NEGATIVE NEGATIVE   Bilirubin Urine NEGATIVE NEGATIVE   Ketones, ur NEGATIVE NEGATIVE mg/dL   Protein, ur NEGATIVE NEGATIVE mg/dL   Urobilinogen, UA 0.2 0.0 - 1.0 mg/dL   Nitrite NEGATIVE NEGATIVE   Leukocytes, UA NEGATIVE NEGATIVE  Lactic acid, plasma  Result Value Ref Range   Lactic Acid, Venous 2.9 (HH) 0.5 - 2.0 mmol/L   Vital signs normal except leukocytosis, elevated lactic acid     Imaging Review Ct Abdomen Pelvis W Contrast  04/28/2014   CLINICAL DATA:  Worsening left lower quad abdominal pain, diarrhea. History of diverticulitis.  EXAM: CT ABDOMEN AND PELVIS WITH CONTRAST  TECHNIQUE: Multidetector CT imaging of the abdomen and pelvis was performed using the standard protocol following bolus administration of intravenous contrast.  CONTRAST:  9mL OMNIPAQUE IOHEXOL 300 MG/ML SOLN, 139mL OMNIPAQUE IOHEXOL 300 MG/ML SOLN  COMPARISON:  CT 06/19/2013  FINDINGS: The lung bases are  clear.  There is a moderate length segment of small bowel in the central lower abdomen demonstrating bowel wall thickening and mild associated mesenteric edema. No bowel dilatation or perforation. Some oral contrast reaches  distal to this. Multiple colonic diverticula again seen in the descending and sigmoid colon. Persistent but decreased fat stranding adjacent to diverticula in the sigmoid colon in a distribution similar to that of prior. The appendix is normal.  The liver, spleen, pancreas, gallbladder, and adrenal glands are normal. There is symmetric renal enhancement and excretion. Bilateral renal cysts are again seen, largest in the peripelvic left kidney. There is no hydronephrosis or perinephric stranding.  No free air, free fluid, or intra-abdominal fluid collection. The abdominal aorta is normal in caliber. There is no retroperitoneal adenopathy.  Within the pelvis the urinary bladder is physiologically distended. The uterus is surgically absent. There are coarse calcifications in both adnexa that are similar to prior exam. No pelvic free fluid. There are no acute or suspicious osseous abnormalities.  IMPRESSION: 1. Abnormal small bowel loop in the lower abdomen most consistent with enteritis. Findings suggest an inflammatory or infectious etiology. 2. Colonic diverticulosis. There is mild fat stranding adjacent to the sigmoid colon in the left lower quadrant and appears similar to prior exam, may reflect sequela of prior diverticulitis. There is no evidence of acute inflammation at this time. 3. Incidental findings of bilateral renal cysts.   Electronically Signed   By: Jeb Levering M.D.   On: 04/28/2014 03:16     EKG Interpretation None      MDM   Final diagnoses:  Dehydration  Nausea  Diarrhea  Enteritis  Left lower quadrant pain  Lactic acidosis   Plan admission   Rolland Porter, MD, Alanson Aly, MD 04/28/14 [id number]

## 2014-04-28 NOTE — ED Notes (Signed)
Pt up to restroom with assistance.

## 2014-04-28 NOTE — Care Management Note (Addendum)
    Page 1 of 1   04/30/2014     10:37:55 AM CARE MANAGEMENT NOTE 04/30/2014  Patient:  Holly [name], Holly [name]   Account Number:  [id number]  Date Initiated:  04/28/2014  Documentation initiated by:  Jolene Provost  Subjective/Objective Assessment:   Pt is from home, lives with fiance. Pt is independent at baseline, has no HH services, DME's or med needs prior to admission. Pt plans to discharge home with self care. No CM needs identified at this time.     Action/Plan:   Anticipated DC Date:  05/01/2014   Anticipated DC Plan:  [place]  CM consult      Choice offered to / List presented to:             Status of service:  In process, will continue to follow Medicare Important Message given?   (If response is "NO", the following Medicare IM given date fields will be blank) Date Medicare IM given:   Medicare IM given by:   Date Additional Medicare IM given:   Additional Medicare IM given by:    Discharge Disposition:  HOME/SELF CARE  Per UR Regulation:  Reviewed for med. necessity/level of care/duration of stay  If discussed at [place] of Stay Meetings, dates discussed:    Comments:  04/28/2014 [place], RN, MSN, CM Pt to discharge home with self care today. Pt has no CM needs. 04/28/2014 [place], RN, MSN, CM

## 2014-04-28 NOTE — ED Notes (Signed)
Pt given 2 warm blankets and fluids were changed out to warm fluids.

## 2014-04-28 NOTE — ED Notes (Signed)
CRITICAL VALUE ALERT  Critical value received:  Lactic acid= 2.9  Date of notification: 04/28/2014  Time of notification:  0230  Critical value read back: yes  Nurse who received alert:  Natividad Brood, RN  MD notified (1st page):  Verbal to Dr. Tomi Bamberger  Time of first page:  0235 verbal  MD notified (2nd page):  Time of second page:  Responding MD:  na  Time MD responded:  na

## 2014-04-28 NOTE — Progress Notes (Signed)
11:35am - RN went to administer pain meds to patient.  Patient stated that she is allergic to Morphine.  Dyanne Carrel, NP notified.  Santiago Glad stated that ok to administer, pt received morphine in ED with no reaction.  Patient stated morphine gives her a fever.  Patient currently afebrile.  Will administer 1mg  Morphine and continue to monitor patient.  1:03pm - Patient's temp post administration of Morphine is 98.3 F.  Will continue to monitor patient

## 2014-04-28 NOTE — ED Notes (Signed)
Pt c/o lower abdominal pain with some diarrhea; nausea but not vomiting

## 2014-04-28 NOTE — ED Notes (Signed)
Pt has some wheezing in her right upper and lower lobes. EDP aware and orders received.

## 2014-04-29 DIAGNOSIS — R197 Diarrhea, unspecified: Secondary | ICD-10-CM

## 2014-04-29 LAB — COMPREHENSIVE METABOLIC PANEL
ALK PHOS: 90 U/L (ref 39–117)
ALT: 16 U/L (ref 0–35)
AST: 14 U/L (ref 0–37)
Albumin: 2.9 g/dL — ABNORMAL LOW (ref 3.5–5.2)
Anion gap: 9 (ref 5–15)
BUN: 9 mg/dL (ref 6–23)
CO2: 24 mmol/L (ref 19–32)
CREATININE: 0.61 mg/dL (ref 0.50–1.10)
Calcium: 8.4 mg/dL (ref 8.4–10.5)
Chloride: 109 mmol/L (ref 96–112)
GFR calc non Af Amer: >90 mL/min (ref >90)
GLUCOSE: 88 mg/dL (ref 70–99)
Potassium: 3.1 mmol/L — ABNORMAL LOW (ref 3.5–5.1)
Sodium: 142 mmol/L (ref 135–145)
Total Bilirubin: 1.5 mg/dL — ABNORMAL HIGH (ref 0.3–1.2)
Total Protein: 5.8 g/dL — ABNORMAL LOW (ref 6.0–8.3)

## 2014-04-29 LAB — CLOSTRIDIUM DIFFICILE BY PCR: CDIFFPCR: NEGATIVE

## 2014-04-29 MED ORDER — KETOROLAC TROMETHAMINE 10 MG PO TABS
10.0000 mg | ORAL_TABLET | Freq: Four times a day (QID) | ORAL | Status: DC | PRN
  Administered 2014-04-29 (×2): 10 mg via ORAL
  Filled 2014-04-29 (×2): qty 1

## 2014-04-29 MED ORDER — SIMETHICONE 80 MG PO CHEW
160.0000 mg | CHEWABLE_TABLET | Freq: Four times a day (QID) | ORAL | Status: DC | PRN
  Administered 2014-04-29: 160 mg via ORAL
  Filled 2014-04-29: qty 2

## 2014-04-29 MED ORDER — POTASSIUM CHLORIDE CRYS ER 20 MEQ PO TBCR
40.0000 meq | EXTENDED_RELEASE_TABLET | Freq: Once | ORAL | Status: AC
  Administered 2014-04-29: 40 meq via ORAL
  Filled 2014-04-29: qty 2

## 2014-04-29 MED ORDER — MILK AND MOLASSES ENEMA
1.0000 | Freq: Once | RECTAL | Status: AC
  Administered 2014-04-29: 250 mL via RECTAL

## 2014-04-29 MED ORDER — PANTOPRAZOLE SODIUM 40 MG PO TBEC
40.0000 mg | DELAYED_RELEASE_TABLET | Freq: Two times a day (BID) | ORAL | Status: DC
Start: 2014-04-29 — End: 2014-04-30
  Administered 2014-04-29 – 2014-04-30 (×3): 40 mg via ORAL
  Filled 2014-04-29 (×3): qty 1

## 2014-04-29 MED ORDER — LINACLOTIDE 145 MCG PO CAPS
145.0000 ug | ORAL_CAPSULE | Freq: Every day | ORAL | Status: DC
  Administered 2014-04-29 – 2014-04-30 (×2): 145 ug via ORAL
  Filled 2014-04-29 (×2): qty 1

## 2014-04-29 NOTE — Care Management Utilization Note (Signed)
UR completed 

## 2014-04-29 NOTE — Progress Notes (Signed)
[facility] PROGRESS NOTE  Holly [name] LFY:[id number] DOB: July 19, 1955 DOA: 04/28/2014 PCP: Monico Blitz, MD  Assessment/Plan: Abdominal pain: Likely related to enteritis in setting of constipation. Will continue supportive therapy. Pain medicine and antiemetic as indicated. IV fluids. Advanced diet and tolerated only bites. Some relief with BM Active Problems:  Enteritis: She remains afebrile and nontoxic appearing. No indication of complicating factors. Continue  Cipro and Flagyl day #2.  Constipation: reports linzess in past. Have resume. Small results. Complains of having to push. Consider enema   Nausea & vomiting: no further episodes  Sinus tachycardia: Likely related to hydration as a result of above. resolved  Code Status: full Family Communication: none present Disposition Plan: home likely in am   Consultants:  none  Procedures:  none  Antibiotics:  cipro 04/28/14>>  Flagyl 04/28/14>>  HPI/Subjective: Reports some improvement in pain but complains of constipation.   Objective: Filed Vitals:   04/29/14 1547  BP: 130/63  Pulse: 93  Temp: 98.4 F (36.9 C)  Resp: 20    Intake/Output Summary (Last 24 hours) at 04/29/14 1552 Last data filed at 04/29/14 1200  Gross per 24 hour  Intake 2991.66 ml  Output    900 ml  Net 2091.66 ml   Filed Weights   04/28/14 0024 04/28/14 0707  Weight: 97.07 kg (214 lb) 98.2 kg (216 lb 7.9 oz)    Exam:   General:  Obese calm comfortable  Cardiovascular: RRR No MGR No LE edema  Respiratory: normal effort BS clear  Abdomen: soft sluggish BS mild diffuse tenderness to palpation  Musculoskeletal: no clubbing or cyanosis   Data Reviewed: Basic Metabolic Panel:  Recent Labs Lab 04/28/14 0050 04/29/14 0540  NA 137 142  K 4.1 3.1*  CL 107 109  CO2 24 24  GLUCOSE 115* 88  BUN 16 9  CREATININE 0.85 0.61  CALCIUM 9.0 8.4   Liver Function Tests:  Recent Labs Lab 04/28/14 0050 04/29/14 0540  AST 22  14  ALT 23 16  ALKPHOS 127* 90  BILITOT 0.6 1.5*  PROT 7.2 5.8*  ALBUMIN 3.8 2.9*   No results for input(s): LIPASE, AMYLASE in the last 168 hours. No results for input(s): AMMONIA in the last 168 hours. CBC:  Recent Labs Lab 04/28/14 0050  WBC 11.8*  NEUTROABS 8.7*  HGB 14.3  HCT 43.7  MCV 92.8  PLT 297   Cardiac Enzymes: No results for input(s): CKTOTAL, CKMB, CKMBINDEX, TROPONINI in the last 168 hours. BNP (last 3 results) No results for input(s): BNP in the last 8760 hours.  ProBNP (last 3 results)  Recent Labs  08/26/13 2005  PROBNP 93.8    CBG: No results for input(s): GLUCAP in the last 168 hours.  Recent Results (from the past 240 hour(s))  MRSA PCR Screening     Status: None   Collection Time: 04/28/14  9:45 AM  Result Value Ref Range Status   MRSA by PCR NEGATIVE NEGATIVE Final    Comment:        The GeneXpert MRSA Assay (FDA approved for NASAL specimens only), is one component of a comprehensive MRSA colonization surveillance program. It is not intended to diagnose MRSA infection nor to guide or monitor treatment for MRSA infections.      Studies: Ct Abdomen Pelvis W Contrast  04/28/2014   CLINICAL DATA:  Worsening left lower quad abdominal pain, diarrhea. History of diverticulitis.  EXAM: CT ABDOMEN AND PELVIS WITH CONTRAST  TECHNIQUE: Multidetector CT imaging of the abdomen  and pelvis was performed using the standard protocol following bolus administration of intravenous contrast.  CONTRAST:  24mL OMNIPAQUE IOHEXOL 300 MG/ML SOLN, 148mL OMNIPAQUE IOHEXOL 300 MG/ML SOLN  COMPARISON:  CT 06/19/2013  FINDINGS: The lung bases are clear.  There is a moderate length segment of small bowel in the central lower abdomen demonstrating bowel wall thickening and mild associated mesenteric edema. No bowel dilatation or perforation. Some oral contrast reaches distal to this. Multiple colonic diverticula again seen in the descending and sigmoid colon. Persistent  but decreased fat stranding adjacent to diverticula in the sigmoid colon in a distribution similar to that of prior. The appendix is normal.  The liver, spleen, pancreas, gallbladder, and adrenal glands are normal. There is symmetric renal enhancement and excretion. Bilateral renal cysts are again seen, largest in the peripelvic left kidney. There is no hydronephrosis or perinephric stranding.  No free air, free fluid, or intra-abdominal fluid collection. The abdominal aorta is normal in caliber. There is no retroperitoneal adenopathy.  Within the pelvis the urinary bladder is physiologically distended. The uterus is surgically absent. There are coarse calcifications in both adnexa that are similar to prior exam. No pelvic free fluid. There are no acute or suspicious osseous abnormalities.  IMPRESSION: 1. Abnormal small bowel loop in the lower abdomen most consistent with enteritis. Findings suggest an inflammatory or infectious etiology. 2. Colonic diverticulosis. There is mild fat stranding adjacent to the sigmoid colon in the left lower quadrant and appears similar to prior exam, may reflect sequela of prior diverticulitis. There is no evidence of acute inflammation at this time. 3. Incidental findings of bilateral renal cysts.   Electronically Signed   By: Jeb Levering M.D.   On: 04/28/2014 03:16   Dg Chest Portable 1 View  04/28/2014   CLINICAL DATA:  Weakness, wheezing, shortness of breath. Abdominal pain.  EXAM: PORTABLE CHEST - 1 VIEW  COMPARISON:  Radiographs and CT 08/26/2013  FINDINGS: Lung volumes remain low. The cardiomediastinal contours are unchanged. Hazy opacity at the left lung base is likely related to soft tissue attenuation. Mild vascular congestion, similar to prior exam. No confluent airspace disease to suggest pneumonia. There is no pleural effusion or pneumothorax. No acute osseous abnormalities are seen.  IMPRESSION: Hypoventilatory chest. No definite acute pulmonary process.  Questionable hazy opacity at the left lung bases likely soft tissue attenuation.   Electronically Signed   By: Jeb Levering M.D.   On: 04/28/2014 05:55    Scheduled Meds: . ciprofloxacin  400 mg Intravenous Q12H  . enoxaparin (LOVENOX) injection  40 mg Subcutaneous Q24H  . Linaclotide  145 mcg Oral QAC breakfast  . metronidazole  500 mg Intravenous Q8H  . pantoprazole  40 mg Oral BID AC   Continuous Infusions: . sodium chloride 10 mL/hr at 04/29/14 1105    Principal Problem:   Abdominal pain Active Problems:   Nausea & vomiting   Nausea   Enteritis   Sinus tachycardia    Time spent: 35 minutes    [facility] Pager [phone]. If 7PM-7AM, please contact night-coverage at [email or web address], password [email or web address] 04/29/2014, 3:52 PM  LOS: 1 day

## 2014-04-30 ENCOUNTER — Encounter (HOSPITAL_COMMUNITY): Payer: Self-pay | Admitting: Internal Medicine

## 2014-04-30 DIAGNOSIS — R1084 Generalized abdominal pain: Secondary | ICD-10-CM

## 2014-04-30 DIAGNOSIS — K589 Irritable bowel syndrome without diarrhea: Secondary | ICD-10-CM | POA: Diagnosis present

## 2014-04-30 DIAGNOSIS — K5900 Constipation, unspecified: Secondary | ICD-10-CM

## 2014-04-30 DIAGNOSIS — K581 Irritable bowel syndrome with constipation: Secondary | ICD-10-CM | POA: Diagnosis present

## 2014-04-30 DIAGNOSIS — E860 Dehydration: Secondary | ICD-10-CM

## 2014-04-30 LAB — BASIC METABOLIC PANEL
Anion gap: 9 (ref 5–15)
BUN: 10 mg/dL (ref 6–23)
CALCIUM: 8.6 mg/dL (ref 8.4–10.5)
CO2: 24 mmol/L (ref 19–32)
Chloride: 108 mmol/L (ref 96–112)
Creatinine, Ser: 0.67 mg/dL (ref 0.50–1.10)
GFR calc Af Amer: >90 mL/min (ref >90)
GFR calc non Af Amer: >90 mL/min (ref >90)
Glucose, Bld: 97 mg/dL (ref 70–99)
Potassium: 3.3 mmol/L — ABNORMAL LOW (ref 3.5–5.1)
SODIUM: 141 mmol/L (ref 135–145)

## 2014-04-30 LAB — CBC
HCT: 37.7 % (ref 36.0–46.0)
HEMOGLOBIN: 12.1 g/dL (ref 12.0–15.0)
MCH: 29.6 pg (ref 26.0–34.0)
MCHC: 32.1 g/dL (ref 30.0–36.0)
MCV: 92.2 fL (ref 78.0–100.0)
Platelets: 267 10*3/uL (ref 150–400)
RBC: 4.09 MIL/uL (ref 3.87–5.11)
RDW: 13.9 % (ref 11.5–15.5)
WBC: 6 10*3/uL (ref 4.0–10.5)

## 2014-04-30 LAB — MAGNESIUM: MAGNESIUM: 2 mg/dL (ref 1.5–2.5)

## 2014-04-30 MED ORDER — METRONIDAZOLE 500 MG PO TABS: 500.0000 mg | ORAL_TABLET | Freq: Three times a day (TID) | ORAL | Status: DC

## 2014-04-30 MED ORDER — LINACLOTIDE 145 MCG PO CAPS: 145.0000 ug | ORAL_CAPSULE | Freq: Every day | ORAL | Status: DC

## 2014-04-30 MED ORDER — POTASSIUM CHLORIDE CRYS ER 20 MEQ PO TBCR
40.0000 meq | EXTENDED_RELEASE_TABLET | Freq: Once | ORAL | Status: AC
  Administered 2014-04-30: 40 meq via ORAL
  Filled 2014-04-30: qty 2

## 2014-04-30 MED ORDER — CIPROFLOXACIN HCL 500 MG PO TABS: 500.0000 mg | ORAL_TABLET | Freq: Two times a day (BID) | ORAL | Status: DC

## 2014-04-30 NOTE — Progress Notes (Signed)
Discharge instructions and prescriptions given, verbalized understanding, out in stable condition via w/c with staff. 

## 2014-04-30 NOTE — Discharge Summary (Signed)
Physician Discharge Summary  Holly [name]:[id number] DOB: [birth year] DOA: 04/28/2014  PCP: Monico Blitz, MD  Admit date: 04/28/2014 Discharge date: 04/30/2014  Time spent: 40 minutes  Recommendations for Outpatient Follow-up:  1. PCP in 1-2 weeks for evaluation of symptoms. Recommend BMET to track potassium level. Management of chronic constipation. Has been on Linzess in past. Follow GI pathogen panel.   2. Follow up GI 05/27/14 for new patient appointment follow diverticulosis and chronic constipation.  Discharge Diagnoses:  Principal Problem:   Abdominal pain Active Problems:   Nausea & vomiting   Nausea   Enteritis   Sinus tachycardia   Diarrhea   Constipation   Discharge Condition: stable  Diet recommendation: soft small frequent meals  Filed Weights   04/28/14 0024 04/28/14 0707  Weight: 97.07 kg (214 lb) 98.2 kg (216 lb 7.9 oz)    History of present illness:  Holly [name] is a 59 y.o. female presented to ED on 04/28/14 with the chief complaint of abdominal pain. Initial evaluation revealed enteritis.  Patient reported that 3 days prior she developed sudden abdominal pain. She reported quite similar to pain she experienced during a diverticulitis attack in past. Described the pain as sharp constant located mostly in the lower quadrants. Associated symptoms included nausea with intermittent vomiting and frequent loose stools. She denied bloody stools or melena. Reported able to keep ginger ale down but no solid foods. She said she took Imodium for the diarrhea with good results and then began feeling constipated and bloated. Reported pain became so intense she came to the emergency department. He denied chest pain palpitation headache fever chills syncope or near-syncope. He denied dysuria hematuria frequency or urgency. Workup in the emergency department included complete metabolic panel unremarkable except for serum glucose of 115, complete blood count with a WBC 11.8, chest  x-ray with no definite acute pulmonary process. Questionable hazy opacity at the left lung base likely soft tissue attenuation. CT of the abdomen abnormal small bowel loop in the lower abdomen most consistent with enteritis. Findings suggest an inflammatory or infectious etiology. Colonic diverticulosis. There is mild fat stranding adjacent to the sigmoid colon in the left lower quadrant and appears similar to prior exam, may reflect sequela of prior diverticulitis. There is no evidence of acute inflammation at this time.  In the emergency department she was afebrile and hemodynamically stable with a heart rate of 100 no hypoxia. She is provided with IV fluids pain medicine Cipro and Flagyl.  Hospital Course:  Abdominal pain: Likely related to enteritis in setting of constipation. She improved with antibiotics. She remained afebrile and non-toxic appearing.  At discharge tolerating soft diet. Will follow up with PCP in 1-2 weeks. Active Problems:  Enteritis: She remained afebrile and nontoxic appearing. Stool for cdiff negative. No indication of complicating factors. Will be discharged with cipro and flagyl for 5 days to complete 7 day course. Has appointment with GI 05/27/14.  Constipation: improved with milk of molassess enema.  reports linzess in past. Have resumed. OP follow up with GI 05/27/14   Nausea & vomiting: resolved   Sinus tachycardia: Likely related to hydration. Resolved.  Hypokalemia: likely related to decreased po intake. Mag level 2.0. Repleted. OP follow up.   Procedures:  none  Consultations:  none  Discharge Exam: Filed Vitals:   04/30/14 0620  BP: 120/73  Pulse: 84  Temp: 98.3 F (36.8 C)  Resp: 20    General: obese ambulating in room with steady gait Cardiovascular: RRR  No MGR No LE edema Respiratory: normal effort BS clear bilaterally no wheeze Abdomen: non-distended soft +BS mild diffuse tenderness on right lower quadrant. No guarding or  rebound  Discharge Instructions    Current Discharge Medication List    START taking these medications   Details  ciprofloxacin (CIPRO) 500 MG tablet Take 1 tablet (500 mg total) by mouth 2 (two) times daily. Qty: 10 tablet, Refills: 0    Linaclotide (LINZESS) 145 MCG CAPS capsule Take 1 capsule (145 mcg total) by mouth daily before breakfast. Qty: 30 capsule, Refills: 0    metroNIDAZOLE (FLAGYL) 500 MG tablet Take 1 tablet (500 mg total) by mouth 3 (three) times daily. Qty: 15 tablet, Refills: 0      CONTINUE these medications which have NOT CHANGED   Details  diphenhydrAMINE (BENADRYL) 25 mg capsule Take 25 mg by mouth at bedtime as needed for allergies or sleep. For allergies.    famotidine (PEPCID) 20 MG tablet Take 20 mg by mouth daily as needed for heartburn or indigestion.    fexofenadine (ALLEGRA) 180 MG tablet Take 180 mg by mouth daily as needed for allergies.     naproxen sodium (ANAPROX) 220 MG tablet Take 440 mg by mouth daily as needed (pain).    Tetrahydrozoline HCl (VISINE OP) Place 1 drop into both eyes daily as needed (dry eyes).        Allergies  Allergen Reactions  . Dilaudid [Hydromorphone Hcl] Nausea And Vomiting  . Codeine Nausea And Vomiting    fever  . Morphine And Related Nausea And Vomiting    fever   Follow-up Information    Follow up with [facility], [facility]. Schedule an appointment as soon as possible for a visit in 1 week.   Specialty:  Internal Medicine   Why:  for evaluation of symptoms   Contact information:   [address]  [place] [address] [phone]       Follow up with Neil Crouch, PA-C On 05/27/2014.   Specialty:  Gastroenterology   Why:  New patient appointment at [facility] information:   [address] [place] [address] [phone]        The results of significant diagnostics from this hospitalization (including imaging, microbiology, ancillary and laboratory) are listed  below for reference.    Significant Diagnostic Studies: Ct Abdomen Pelvis W Contrast  04/28/2014   CLINICAL DATA:  Worsening left lower quad abdominal pain, diarrhea. History of diverticulitis.  EXAM: CT ABDOMEN AND PELVIS WITH CONTRAST  TECHNIQUE: Multidetector CT imaging of the abdomen and pelvis was performed using the standard protocol following bolus administration of intravenous contrast.  CONTRAST:  65mL OMNIPAQUE IOHEXOL 300 MG/ML SOLN, 161mL OMNIPAQUE IOHEXOL 300 MG/ML SOLN  COMPARISON:  CT 06/19/2013  FINDINGS: The lung bases are clear.  There is a moderate length segment of small bowel in the central lower abdomen demonstrating bowel wall thickening and mild associated mesenteric edema. No bowel dilatation or perforation. Some oral contrast reaches distal to this. Multiple colonic diverticula again seen in the descending and sigmoid colon. Persistent but decreased fat stranding adjacent to diverticula in the sigmoid colon in a distribution similar to that of prior. The appendix is normal.  The liver, spleen, pancreas, gallbladder, and adrenal glands are normal. There is symmetric renal enhancement and excretion. Bilateral renal cysts are again seen, largest in the peripelvic left kidney. There is no hydronephrosis or perinephric stranding.  No free air, free fluid,  or intra-abdominal fluid collection. The abdominal aorta is normal in caliber. There is no retroperitoneal adenopathy.  Within the pelvis the urinary bladder is physiologically distended. The uterus is surgically absent. There are coarse calcifications in both adnexa that are similar to prior exam. No pelvic free fluid. There are no acute or suspicious osseous abnormalities.  IMPRESSION: 1. Abnormal small bowel loop in the lower abdomen most consistent with enteritis. Findings suggest an inflammatory or infectious etiology. 2. Colonic diverticulosis. There is mild fat stranding adjacent to the sigmoid colon in the left lower quadrant and  appears similar to prior exam, may reflect sequela of prior diverticulitis. There is no evidence of acute inflammation at this time. 3. Incidental findings of bilateral renal cysts.   Electronically Signed   By: Jeb Levering M.D.   On: 04/28/2014 03:16   Dg Chest Portable 1 View  04/28/2014   CLINICAL DATA:  Weakness, wheezing, shortness of breath. Abdominal pain.  EXAM: PORTABLE CHEST - 1 VIEW  COMPARISON:  Radiographs and CT 08/26/2013  FINDINGS: Lung volumes remain low. The cardiomediastinal contours are unchanged. Hazy opacity at the left lung base is likely related to soft tissue attenuation. Mild vascular congestion, similar to prior exam. No confluent airspace disease to suggest pneumonia. There is no pleural effusion or pneumothorax. No acute osseous abnormalities are seen.  IMPRESSION: Hypoventilatory chest. No definite acute pulmonary process. Questionable hazy opacity at the left lung bases likely soft tissue attenuation.   Electronically Signed   By: Jeb Levering M.D.   On: 04/28/2014 05:55    Microbiology: Recent Results (from the past 240 hour(s))  MRSA PCR Screening     Status: None   Collection Time: 04/28/14  9:45 AM  Result Value Ref Range Status   MRSA by PCR NEGATIVE NEGATIVE Final    Comment:        The GeneXpert MRSA Assay (FDA approved for NASAL specimens only), is one component of a comprehensive MRSA colonization surveillance program. It is not intended to diagnose MRSA infection nor to guide or monitor treatment for MRSA infections.   Clostridium Difficile by PCR     Status: None   Collection Time: 04/29/14 12:44 PM  Result Value Ref Range Status   C difficile by pcr NEGATIVE NEGATIVE Final     Labs: Basic Metabolic Panel:  Recent Labs Lab 04/28/14 0050 04/29/14 0540 04/30/14 0529 04/30/14 0749  NA 137 142 141  --   K 4.1 3.1* 3.3*  --   CL 107 109 108  --   CO2 24 24 24   --   GLUCOSE 115* 88 97  --   BUN 16 9 10   --   CREATININE 0.85 0.61  0.67  --   CALCIUM 9.0 8.4 8.6  --   MG  --   --   --  2.0   Liver Function Tests:  Recent Labs Lab 04/28/14 0050 04/29/14 0540  AST 22 14  ALT 23 16  ALKPHOS 127* 90  BILITOT 0.6 1.5*  PROT 7.2 5.8*  ALBUMIN 3.8 2.9*   No results for input(s): LIPASE, AMYLASE in the last 168 hours. No results for input(s): AMMONIA in the last 168 hours. CBC:  Recent Labs Lab 04/28/14 0050 04/30/14 0529  WBC 11.8* 6.0  NEUTROABS 8.7*  --   HGB 14.3 12.1  HCT 43.7 37.7  MCV 92.8 92.2  PLT 297 267   Cardiac Enzymes: No results for input(s): CKTOTAL, CKMB, CKMBINDEX, TROPONINI in the last 168 hours.  BNP: BNP (last 3 results) No results for input(s): BNP in the last 8760 hours.  ProBNP (last 3 results)  Recent Labs  08/26/13 2005  PROBNP 93.8    CBG: No results for input(s): GLUCAP in the last 168 hours.     SignedRadene Gunning  [facility] 04/30/2014, 10:14 AM

## 2014-05-01 DIAGNOSIS — E860 Dehydration: Secondary | ICD-10-CM | POA: Insufficient documentation

## 2014-05-01 LAB — GI PATHOGEN PANEL BY PCR, STOOL
C difficile toxin A/B: NOT DETECTED
Campylobacter by PCR: NOT DETECTED
Cryptosporidium by PCR: NOT DETECTED
E COLI (ETEC) LT/ST: NOT DETECTED
E COLI (STEC): NOT DETECTED
E coli 0157 by PCR: NOT DETECTED
G lamblia by PCR: NOT DETECTED
NOROVIRUS G1/G2: NOT DETECTED
Rotavirus A by PCR: NOT DETECTED
SALMONELLA BY PCR: NOT DETECTED
SHIGELLA BY PCR: NOT DETECTED

## 2014-05-27 ENCOUNTER — Ambulatory Visit (INDEPENDENT_AMBULATORY_CARE_PROVIDER_SITE_OTHER): Payer: Medicare Other | Admitting: Gastroenterology

## 2014-05-27 ENCOUNTER — Ambulatory Visit: Payer: BLUE CROSS/BLUE SHIELD | Admitting: Gastroenterology

## 2014-05-27 ENCOUNTER — Encounter: Payer: Self-pay | Admitting: Gastroenterology

## 2014-05-27 VITALS — BP 126/90 | HR 89 | Temp 97.2°F | Ht 61.0 in | Wt 208.4 lb

## 2014-05-27 DIAGNOSIS — R1032 Left lower quadrant pain: Secondary | ICD-10-CM

## 2014-05-27 DIAGNOSIS — K219 Gastro-esophageal reflux disease without esophagitis: Secondary | ICD-10-CM | POA: Diagnosis not present

## 2014-05-27 DIAGNOSIS — K5900 Constipation, unspecified: Secondary | ICD-10-CM

## 2014-05-27 DIAGNOSIS — K529 Noninfective gastroenteritis and colitis, unspecified: Secondary | ICD-10-CM | POA: Diagnosis not present

## 2014-05-27 MED ORDER — RABEPRAZOLE SODIUM 20 MG PO TBEC: 20.0000 mg | DELAYED_RELEASE_TABLET | Freq: Every day | ORAL | Status: DC

## 2014-05-27 MED ORDER — LINACLOTIDE 145 MCG PO CAPS: ORAL_CAPSULE | ORAL | Status: DC

## 2014-05-27 NOTE — Progress Notes (Signed)
Primary Care Physician:  Monico Blitz, MD  Primary Gastroenterologist:  Barney Drain, MD   Chief Complaint  Patient presents with  . Follow-up  . Constipation    HPI:  Holly [name] is a 59 y.o. female here as new patient, arrangements made at time of discharge from recent hospitalization last month. We did not see the patient during recent hospitalization for abdominal pain, vomiting, diarrhea. Hospitalized back in March 2015 with diverticulitis.  Patient states she developed sharp acute onset pain in the bottom of her stomach a few days prior to her hospitalization last month. Symptoms similar to previously when she had diverticulitis, March 2015. Associated with nausea, vomiting, diarrhea. No blood in the stool or melena. CT of the abdomen and pelvis with contrast showed abnormal small bowel loop in the lower abdomen consistent with enteritis. Colonic diverticulosis with mild fat stranding adjacent to the sigmoid colon in the left lower quadrant similar to previous study March 2015, possibly sequelae of prior diverticulitis. GI pathogen panel and C. difficile PCR were both negative. She was treated with antibiotic therapy.  She has baseline constipation. States that she continues to have abdominal pain since she's been on the hospital. Pain is not present every day. She notices it more at nighttime in the left flank area. Notes that she only has a bowel movement which takes a laxative. Usually takes one every 2-3 days. Usually Dulcolax and stool softeners. Previously was on Linzess but cannot tolerated except for about every other day due to diarrhea. Patient states she was taking with food, should be taken on empty stomach. She's been eating yogurt daily. Avoiding seeds, peanuts, popcorn. Denies any melena rectal bleeding. Heartburn continues to be an issue, poorly controlled on Nexium. Previously failed over-the-counter protonix, Prilosec.  She reports having a colonoscopy with Dr. Britta Mccreedy in  2012.    Current Outpatient Prescriptions  Medication Sig Dispense Refill  . diphenhydrAMINE (BENADRYL) 25 mg capsule Take 25 mg by mouth at bedtime as needed for allergies or sleep. For allergies.    . Esomeprazole Magnesium (NEXIUM 24HR PO) Take 1 capsule by mouth daily.    . fexofenadine (ALLEGRA) 180 MG tablet Take 180 mg by mouth daily as needed for allergies.     . naproxen sodium (ANAPROX) 220 MG tablet Take 440 mg by mouth daily as needed (pain).    . Tetrahydrozoline HCl (VISINE OP) Place 1 drop into both eyes daily as needed (dry eyes).      No current facility-administered medications for this visit.    Allergies as of 05/27/2014 - Review Complete 05/27/2014  Allergen Reaction Noted  . Dilaudid [hydromorphone hcl] Nausea And Vomiting 04/02/2012  . Codeine Nausea And Vomiting 01/20/2012  . Morphine and related Nausea And Vomiting 01/20/2012    Past Medical History  Diagnosis Date  . Sinus problem   . Arthritis     knees  . Hay fever   . Diverticulitis   . GERD (gastroesophageal reflux disease)   . Headache(784.0)   . Diverticulosis   . Enteritis   . Constipation     chronic    Past Surgical History  Procedure Laterality Date  . Abdominal hysterectomy  2003    partial  . Tonsillectomy      age 75  . Total knee arthroplasty  01/30/2012    Procedure: TOTAL KNEE ARTHROPLASTY;  Surgeon: Gearlean Alf, MD;  Location: [facility];  Service: Orthopedics;  Laterality: Left;  . Foot surgery      bilateral -  bone removal  . Knee closed reduction  04/02/2012    Procedure: CLOSED MANIPULATION KNEE;  Surgeon: Gearlean Alf, MD;  Location: [facility];  Service: Orthopedics;  Laterality: Left;    Family History  Problem Relation Age of Onset  . Colon cancer Neg Hx   . Liver cancer Sister     deceased    History   Social History  . Marital Status: Single    Spouse Name: N/A  . Number of Children: 3  . Years of Education: N/A   Occupational History  . out of work     Social History Main Topics  . Smoking status: Former Smoker -- 0.50 packs/day for 40 years    Types: Cigarettes    Quit date: 01/28/2012  . Smokeless tobacco: Never Used  . Alcohol Use: No  . Drug Use: No  . Sexual Activity: Not Currently   Other Topics Concern  . Not on file   Social History Narrative      ROS:  General: Negative for anorexia, weight loss, fever, chills, fatigue, weakness. Eyes: Negative for vision changes.  ENT: Negative for hoarseness, difficulty swallowing , nasal congestion. CV: Negative for chest pain, angina, palpitations, dyspnea on exertion, peripheral edema.  Respiratory: Negative for dyspnea at rest, dyspnea on exertion, cough, sputum, wheezing.  GI: See history of present illness. GU:  Negative for dysuria, hematuria, urinary incontinence, urinary frequency, nocturnal urination.  MS: Negative for joint pain, low back pain.  Derm: Negative for rash or itching.  Neuro: Negative for weakness, abnormal sensation, seizure, frequent headaches, memory loss, confusion.  Psych: Negative for anxiety, depression, suicidal ideation, hallucinations.  Endo: Negative for unusual weight change.  Heme: Negative for bruising or bleeding. Allergy: Negative for rash or hives.    Physical Examination:  BP 126/90 mmHg  Pulse 89  Temp(Src) 97.2 F (36.2 C)  Ht 5\' 1"  (1.549 m)  Wt 208 lb 6.4 oz (94.53 kg)  BMI 39.40 kg/m2   General: Well-nourished, well-developed in no acute distress.  Head: Normocephalic, atraumatic.   Eyes: Conjunctiva pink, no icterus. Mouth: Oropharyngeal mucosa moist and pink , no lesions erythema or exudate. Neck: Supple without thyromegaly, masses, or lymphadenopathy.  Lungs: Clear to auscultation bilaterally.  Heart: Regular rate and rhythm, no murmurs rubs or gallops.  Abdomen: Bowel sounds are normal, mild tenderness in LLQ, nondistended, no hepatosplenomegaly or masses, no abdominal bruits or    hernia , no rebound or guarding.    Rectal: not performed Extremities: No lower extremity edema. No clubbing or deformities.  Neuro: Alert and oriented x 4 , grossly normal neurologically.  Skin: Warm and dry, no rash or jaundice.   Psych: Alert and cooperative, normal mood and affect.  Labs: Lab Results  Component Value Date   WBC 6.0 04/30/2014   HGB 12.1 04/30/2014   HCT 37.7 04/30/2014   MCV 92.2 04/30/2014   PLT 267 04/30/2014   Lab Results  Component Value Date   CREATININE 0.67 04/30/2014   BUN 10 04/30/2014   NA 141 04/30/2014   K 3.3* 04/30/2014   CL 108 04/30/2014   CO2 24 04/30/2014   Lab Results  Component Value Date   ALT 16 04/29/2014   AST 14 04/29/2014   ALKPHOS 90 04/29/2014   BILITOT 1.5* 04/29/2014     Imaging Studies: Ct Abdomen Pelvis W Contrast  04/28/2014   CLINICAL DATA:  Worsening left lower quad abdominal pain, diarrhea. History of diverticulitis.  EXAM: CT ABDOMEN AND PELVIS  WITH CONTRAST  TECHNIQUE: Multidetector CT imaging of the abdomen and pelvis was performed using the standard protocol following bolus administration of intravenous contrast.  CONTRAST:  18mL OMNIPAQUE IOHEXOL 300 MG/ML SOLN, 155mL OMNIPAQUE IOHEXOL 300 MG/ML SOLN  COMPARISON:  CT 06/19/2013  FINDINGS: The lung bases are clear.  There is a moderate length segment of small bowel in the central lower abdomen demonstrating bowel wall thickening and mild associated mesenteric edema. No bowel dilatation or perforation. Some oral contrast reaches distal to this. Multiple colonic diverticula again seen in the descending and sigmoid colon. Persistent but decreased fat stranding adjacent to diverticula in the sigmoid colon in a distribution similar to that of prior. The appendix is normal.  The liver, spleen, pancreas, gallbladder, and adrenal glands are normal. There is symmetric renal enhancement and excretion. Bilateral renal cysts are again seen, largest in the peripelvic left kidney. There is no hydronephrosis or  perinephric stranding.  No free air, free fluid, or intra-abdominal fluid collection. The abdominal aorta is normal in caliber. There is no retroperitoneal adenopathy.  Within the pelvis the urinary bladder is physiologically distended. The uterus is surgically absent. There are coarse calcifications in both adnexa that are similar to prior exam. No pelvic free fluid. There are no acute or suspicious osseous abnormalities.  IMPRESSION: 1. Abnormal small bowel loop in the lower abdomen most consistent with enteritis. Findings suggest an inflammatory or infectious etiology. 2. Colonic diverticulosis. There is mild fat stranding adjacent to the sigmoid colon in the left lower quadrant and appears similar to prior exam, may reflect sequela of prior diverticulitis. There is no evidence of acute inflammation at this time. 3. Incidental findings of bilateral renal cysts.   Electronically Signed   By: Jeb Levering M.D.   On: 04/28/2014 03:16   Dg Chest Portable 1 View  04/28/2014   CLINICAL DATA:  Weakness, wheezing, shortness of breath. Abdominal pain.  EXAM: PORTABLE CHEST - 1 VIEW  COMPARISON:  Radiographs and CT 08/26/2013  FINDINGS: Lung volumes remain low. The cardiomediastinal contours are unchanged. Hazy opacity at the left lung base is likely related to soft tissue attenuation. Mild vascular congestion, similar to prior exam. No confluent airspace disease to suggest pneumonia. There is no pleural effusion or pneumothorax. No acute osseous abnormalities are seen.  IMPRESSION: Hypoventilatory chest. No definite acute pulmonary process. Questionable hazy opacity at the left lung bases likely soft tissue attenuation.   Electronically Signed   By: Jeb Levering M.D.   On: 04/28/2014 05:55

## 2014-05-27 NOTE — Patient Instructions (Signed)
1. I will obtain all of your records and review. Further recommendations to follow. 2. Stop Nexium. Start AcipHex 20 mg daily before breakfast. It appears to be covered on your insurance. 3. Try Linzess once daily on empty stomach. Samples and RX provided. If you continue to have diarrhea, we will see about getting the Amitiza covered instead. I have provided you with Amitiza samples in case we need to go that route. If you need to use the Amitiza you'll take 1 with breakfast and one with evening meal (take with food).

## 2014-05-29 NOTE — Assessment & Plan Note (Signed)
GERD for the past few years. Symptoms currently poorly controlled. No prior upper endoscopy. Prescription for AcipHex 20 mg daily provided.

## 2014-05-29 NOTE — Assessment & Plan Note (Signed)
Recent hospitalization for acute onset abdominal pain associated with diarrhea, vomiting. CT findings suggestive of enteritis. Improved on antibiotic therapy. Patient continues to have some abdominal pain intermittently. This is in the setting of constipation and prior history of diverticulitis. Previous colonoscopy in 2012 by Dr. Britta Mccreedy.  We have requested her records. In the meantime, she will try Linzess on an emtpy stomach to see if this better tolerated. If she continues to have frequent loose stools, we can try Amitiza and adjusted dose accordingly. Samples and instructions provided in case she does need to switch.

## 2014-05-30 NOTE — Progress Notes (Signed)
CC'ED TO PCP 

## 2014-06-16 DIAGNOSIS — D229 Melanocytic nevi, unspecified: Secondary | ICD-10-CM | POA: Diagnosis not present

## 2014-06-16 DIAGNOSIS — R0981 Nasal congestion: Secondary | ICD-10-CM | POA: Diagnosis not present

## 2014-06-16 DIAGNOSIS — K589 Irritable bowel syndrome without diarrhea: Secondary | ICD-10-CM | POA: Diagnosis not present

## 2014-06-16 DIAGNOSIS — R0982 Postnasal drip: Secondary | ICD-10-CM | POA: Diagnosis not present

## 2014-06-30 DIAGNOSIS — D2239 Melanocytic nevi of other parts of face: Secondary | ICD-10-CM | POA: Diagnosis not present

## 2014-06-30 DIAGNOSIS — L309 Dermatitis, unspecified: Secondary | ICD-10-CM | POA: Diagnosis not present

## 2014-07-08 ENCOUNTER — Encounter: Payer: Self-pay | Admitting: Gastroenterology

## 2014-07-08 NOTE — Progress Notes (Addendum)
Colonoscopy December 2012 by Dr. Doristine Mango using propofol. She had scattered diverticula throughout the colon. Sessile polyp found in removed from the descending colon, pathology unavailable at this time.  Let's find out how the patient is doing with her bowels and abdominal pain.   Manuela Schwartz, please request pathology from 02/2011 [facility].

## 2014-07-08 NOTE — Progress Notes (Signed)
Requested path report from 02/2011 [facility] done by Dr Britta Mccreedy to be faxed to [place].

## 2014-07-09 ENCOUNTER — Telehealth: Payer: Self-pay | Admitting: Gastroenterology

## 2014-07-09 DIAGNOSIS — R0982 Postnasal drip: Secondary | ICD-10-CM | POA: Diagnosis not present

## 2014-07-09 DIAGNOSIS — K589 Irritable bowel syndrome without diarrhea: Secondary | ICD-10-CM | POA: Diagnosis not present

## 2014-07-09 DIAGNOSIS — D229 Melanocytic nevi, unspecified: Secondary | ICD-10-CM | POA: Diagnosis not present

## 2014-07-09 DIAGNOSIS — R0981 Nasal congestion: Secondary | ICD-10-CM | POA: Diagnosis not present

## 2014-07-09 NOTE — Telephone Encounter (Signed)
I had faxed a request to Dr Wylie Hail office for them to fax [place] the path report from Dec 2012 on colonoscopy.  Today I called medical records at [facility] and spoke with [facility] requesting the same path report. She said she has the procedure report, but there wasn't a path report to go along with it.

## 2014-07-09 NOTE — Progress Notes (Signed)
LMOM to call.

## 2014-07-10 NOTE — Progress Notes (Signed)
Per Manuela Schwartz she got a call and said pt did not have one. She had informed Neil Crouch, PA.

## 2014-07-14 ENCOUNTER — Telehealth: Payer: Self-pay

## 2014-07-14 NOTE — Telephone Encounter (Signed)
Pt called and states that someone called her last week to see how she was doing and was returning the call.  States she is doing ok and she is taking the Linzess.  States that she still is having some trouble with constipation

## 2014-07-15 NOTE — Telephone Encounter (Signed)
Please let patient know she can continue Linzess 146mcg daily or if she feels that she is not have easy soft stools, then we can increase the dose.   Last OV she was complaining of loose stools on Linzess 123mcg when taken with food. So we adjusted to taking on empty stomach.  1# Let me know if she wants to try the Linzess 22mcg daily. 2# Is she still having abdominal pain? 3# Please schedule routine follow p. 4# I am still waiting on the pathology report from her colonoscopy 2012/Dr. Britta Mccreedy at [facility].

## 2014-07-15 NOTE — Telephone Encounter (Signed)
I called [facility] medical records and they do not have a path report from 02/2011. I have re-requested both procedure and path from Dr Wylie Hail office.

## 2014-07-15 NOTE — Telephone Encounter (Signed)
ON RECALL FOR [facility]CS °

## 2014-07-15 NOTE — Telephone Encounter (Signed)
No longer have [facility] report to see if polyp submitted for pathology. Waiting on it to be scanned. We are checking with [facility].  Please NIC for [facility] in 03/2015 (h/o polyps).

## 2014-07-15 NOTE — Telephone Encounter (Signed)
Just seeing correspondence. Per Manuela Schwartz, Dr. Wylie Hail office report that there was no path available for the colonoscopy.  Can we please check with [facility] to be sure? I don't have the op note still (waiting on it be to scanned) for me to look at and see if he submitted path.

## 2014-07-15 NOTE — Telephone Encounter (Signed)
Routing to [employer]. This is an [facility] pt.

## 2014-07-15 NOTE — Telephone Encounter (Signed)
Pt said she is having very little abdominal pain now. She is taking the Linzess 145 and does not need to increase at this time. She is sometimes having 2 BM's daily and sometimes will skip one day, but her stools are soft. Said she will call if she feels she needs anything. She is aware we are still trying to get some records.

## 2014-07-16 ENCOUNTER — Telehealth: Payer: Self-pay | Admitting: Gastroenterology

## 2014-07-16 NOTE — Telephone Encounter (Signed)
Per Dr Wylie Hail office and [facility] medical records there is no path report on tcs done in 02/2011

## 2014-07-16 NOTE — Telephone Encounter (Signed)
noted 

## 2014-07-17 NOTE — Telephone Encounter (Signed)
Noted  

## 2014-07-24 DIAGNOSIS — M129 Arthropathy, unspecified: Secondary | ICD-10-CM | POA: Diagnosis not present

## 2014-07-24 DIAGNOSIS — E782 Mixed hyperlipidemia: Secondary | ICD-10-CM | POA: Diagnosis not present

## 2014-07-24 DIAGNOSIS — R600 Localized edema: Secondary | ICD-10-CM | POA: Diagnosis not present

## 2014-07-24 DIAGNOSIS — K589 Irritable bowel syndrome without diarrhea: Secondary | ICD-10-CM | POA: Diagnosis not present

## 2014-07-24 DIAGNOSIS — Z1389 Encounter for screening for other disorder: Secondary | ICD-10-CM | POA: Diagnosis not present

## 2014-07-24 DIAGNOSIS — Z01419 Encounter for gynecological examination (general) (routine) without abnormal findings: Secondary | ICD-10-CM | POA: Diagnosis not present

## 2014-07-24 DIAGNOSIS — Z23 Encounter for immunization: Secondary | ICD-10-CM | POA: Diagnosis not present

## 2014-07-29 DIAGNOSIS — Z1231 Encounter for screening mammogram for malignant neoplasm of breast: Secondary | ICD-10-CM | POA: Diagnosis not present

## 2014-09-02 DIAGNOSIS — J209 Acute bronchitis, unspecified: Secondary | ICD-10-CM | POA: Diagnosis not present

## 2014-09-02 DIAGNOSIS — Z885 Allergy status to narcotic agent status: Secondary | ICD-10-CM | POA: Diagnosis not present

## 2014-09-02 DIAGNOSIS — R0602 Shortness of breath: Secondary | ICD-10-CM | POA: Diagnosis not present

## 2014-09-02 DIAGNOSIS — R0600 Dyspnea, unspecified: Secondary | ICD-10-CM | POA: Diagnosis not present

## 2014-09-02 DIAGNOSIS — Z87891 Personal history of nicotine dependence: Secondary | ICD-10-CM | POA: Diagnosis not present

## 2014-09-02 DIAGNOSIS — R000 Tachycardia, unspecified: Secondary | ICD-10-CM | POA: Diagnosis not present

## 2014-09-02 DIAGNOSIS — R05 Cough: Secondary | ICD-10-CM | POA: Diagnosis not present

## 2014-09-11 DIAGNOSIS — H5201 Hypermetropia, right eye: Secondary | ICD-10-CM | POA: Diagnosis not present

## 2014-09-11 DIAGNOSIS — H524 Presbyopia: Secondary | ICD-10-CM | POA: Diagnosis not present

## 2014-09-11 DIAGNOSIS — H5212 Myopia, left eye: Secondary | ICD-10-CM | POA: Diagnosis not present

## 2014-09-24 ENCOUNTER — Ambulatory Visit (INDEPENDENT_AMBULATORY_CARE_PROVIDER_SITE_OTHER): Payer: Medicare Other | Admitting: Gastroenterology

## 2014-09-24 ENCOUNTER — Encounter: Payer: Self-pay | Admitting: Gastroenterology

## 2014-09-24 VITALS — BP 127/76 | HR 81 | Temp 98.2°F | Ht 65.0 in | Wt 219.8 lb

## 2014-09-24 DIAGNOSIS — K219 Gastro-esophageal reflux disease without esophagitis: Secondary | ICD-10-CM | POA: Diagnosis not present

## 2014-09-24 DIAGNOSIS — K589 Irritable bowel syndrome without diarrhea: Secondary | ICD-10-CM

## 2014-09-24 MED ORDER — LINACLOTIDE 145 MCG PO CAPS: ORAL_CAPSULE | ORAL | Status: DC

## 2014-09-24 MED ORDER — DEXLANSOPRAZOLE 60 MG PO CPDR: DELAYED_RELEASE_CAPSULE | ORAL | Status: DC

## 2014-09-24 NOTE — Assessment & Plan Note (Signed)
SYMPTOMS NOT IDEALLY CONTROLLED WITH ACIPHEX.  AVOID TRIGGERS DEXILANT DAILY LOSE WEIGHT FOLLOW UP IN 4 MOS.

## 2014-09-24 NOTE — Progress Notes (Signed)
Subjective:    Patient ID: Holly [name], female    DOB: 10-31-1955, 59 y.o.   MRN: [id number]  Gar Ponto, MD  HPI Weight gain on steroids and since left KNEE REPLACEMENT.  BMs:  LINZESS CAUSES MILD BLOATING. LINZESS HELPS BOWELS MOVE WHEN SHE TAKES IT. HEARTBURN:  STAYS GASSY IN UPPER ABDOMEN. DIDN'T LIKE ACIPHEX BECAUSE SHE FELT LIKE SHE WAS CONSTIPATED. L FLANK PAIN STARTS WITH SLEEPING ON THAT SIDE. HAD THIS PAIN FOR YEARS. EVEN WHEN SHE WORKED IN A [employer]-D[employer] A LOT OF PUSHING AND PULILNG. ON DISABILITY DUE TO KNEE TROUBLE. SOMETIMES FEELS A KNOT IN LOWER ABDOMEN. MILK: RARE, CHEESE: RARE, ICE CREAM: VERY LITTLE. EATS A LOT OF YOGURT. HAS LOWER ABD PAIN IF NO BM THAT GETS BETTER AFTER A BM.  PT DENIES FEVER, CHILLS, HEMATOCHEZIA, nausea, vomiting, melena, diarrhea, CHEST PAIN, SHORTNESS OF BREATH,  CHANGE IN BOWEL IN HABITS, OR problems swallowing.   Past Medical History  Diagnosis Date  . Sinus problem   . Arthritis     knees  . Hay fever   . Diverticulitis   . GERD (gastroesophageal reflux disease)   . Headache(784.0)   . Diverticulosis   . Enteritis   . Constipation     chronic   Past Surgical History  Procedure Laterality Date  . Abdominal hysterectomy  2003    partial  . Tonsillectomy      age 8  . Total knee arthroplasty  01/30/2012    Procedure: TOTAL KNEE ARTHROPLASTY;  Surgeon: Gearlean Alf, MD;  Location: [facility];  Service: Orthopedics;  Laterality: Left;  . Foot surgery      bilateral -bone removal  . Knee closed reduction  04/02/2012    Procedure: CLOSED MANIPULATION KNEE;  Surgeon: Gearlean Alf, MD;  Location: [facility];  Service: Orthopedics;  Laterality: Left;  . Colonoscopy  02/2011    Dr. Doristine Mango: PROPOFOL. scattered diverticula, sessile polyp descending colon, path unavailable. recommenced five year follow up colonoscopy    Allergies  Allergen Reactions  . Dilaudid [Hydromorphone Hcl] Nausea And Vomiting  . Codeine Nausea And  Vomiting    fever  . Morphine And Related Nausea And Vomiting    fever    Current Outpatient Prescriptions  Medication Sig Dispense Refill  . diphenhydrAMINE (BENADRYL) 25 mg capsule Take 25 mg by mouth at bedtime as needed for allergies or sleep. For allergies.    .      . fexofenadine (ALLEGRA) 180 MG tablet Take 180 mg by mouth daily as needed for allergies.     .      . naproxen sodium (ANAPROX) 220 MG tablet Take 440 mg by mouth daily as needed (pain).    .      Adella Nissen HCl (VISINE OP) Place 1 drop into both eyes daily as needed (dry eyes).      Review of Systems    Objective:   Physical Exam  Constitutional: She is oriented to person, place, and time. She appears well-developed and well-nourished. No distress.  HENT:  Head: Normocephalic and atraumatic.  Mouth/Throat: Oropharynx is clear and moist. No oropharyngeal exudate.  Eyes: Pupils are equal, round, and reactive to light. No scleral icterus.  Neck: Normal range of motion. Neck supple.  Cardiovascular: Normal rate, regular rhythm and normal heart sounds.   Pulmonary/Chest: Effort normal and breath sounds normal. No respiratory distress.  Abdominal: Soft. Bowel sounds are normal. She exhibits no distension. There is no tenderness.  Musculoskeletal: She exhibits edema.  Lymphadenopathy:    She has no cervical adenopathy.  Neurological: She is alert and oriented to person, place, and time.  NO FOCAL DEFICITS   Psychiatric: She has a normal mood and affect.  Vitals reviewed.         Assessment & Plan:

## 2014-09-24 NOTE — Patient Instructions (Signed)
DRINK WATER TO KEEP YOUR URINE LIGHT YELLOW.  FOLLOW A HIGH FIBER DIET. AVOID ITEMS THAT CAUSE BLOATING & GAS. SEE INFO BELOW.  CONTINUE YOUR WEIGHT LOSS EFFORTS.  AVOID REFLUX TRIGGERS. SEE INFO BELOW.  ADD LACTASE 3 PILLS WHEN EATING YOGURT TO REDUCE BLOATING.  CONTINUE LINZESS.  START DEXILANT TO TREAT REFLUX.  FOLLOW UP IN 4 MOS.       Lifestyle and home remedies TO MANAGE REFLUX/CHEST PAIN  You may eliminate or reduce the frequency of heartburn by making the following lifestyle changes:  . Control your weight. Being overweight is a major risk factor for heartburn and GERD. Excess pounds put pressure on your abdomen, pushing up your stomach and causing acid to back up into your esophagus.   . Eat smaller meals. 4 TO 6 MEALS A DAY. This reduces pressure on the lower esophageal sphincter, helping to prevent the valve from opening and acid from washing back into your esophagus.   Dolphus Jenny your belt. Clothes that fit tightly around your waist put pressure on your abdomen and the lower esophageal sphincter.   . Eliminate heartburn triggers. Everyone has specific triggers. Common triggers such as fatty or fried foods, spicy food, tomato sauce, carbonated beverages, alcohol, chocolate, mint, garlic, onion, caffeine and nicotine may make heartburn worse.   Marland Kitchen Avoid stooping or bending. Tying your shoes is OK. Bending over for longer periods to weed your garden isn't, especially soon after eating.   . Don't lie down after a meal. Wait at least three to four hours after eating before going to bed, and don't lie down right after eating.    Alternative medicine . Several home remedies exist for treating GERD, but they provide only temporary relief. They include drinking baking soda (sodium bicarbonate) added to water or drinking other fluids such as baking soda mixed with cream of tartar and water.  . Although these liquids create temporary relief by neutralizing, washing away or  buffering acids, eventually they aggravate the situation by adding gas and fluid to your stomach, increasing pressure and causing more acid reflux. Further, adding more sodium to your diet may increase your blood pressure and add stress to your heart, and excessive bicarbonate ingestion can alter the acid-base balance in your body.  BLOATING AND GAS PREVENTION  Although gas may be uncomfortable and embarrassing, it is not life-threatening. Understanding causes, ways to reduce symptoms, and treatment will help most people find some relief. Points to remember . Everyone has gas in the digestive tract. Marland Kitchen People often believe normal passage of gas to be excessive. . Gas comes from two main sources: swallowed air and normal breakdown of certain foods by harmless bacteria naturally present in the large intestine. . Many foods with carbohydrates can cause gas. Fats and proteins cause little gas. . Foods that may cause gas include o beans  o vegetables, such as broccoli, cabbage, brussels sprouts, onions, artichokes, and asparagus  o fruits, such as pears, apples, and peaches  o whole grains, such as whole wheat and bran  o soft drinks and fruit drinks  o milk and milk products, such as cheese and ice cream, and packaged foods prepared with lactose, such as bread, cereal, and salad dressing  o foods containing sorbitol, such as dietetic foods and sugar free candies and gums . The most common symptoms of gas are belching, flatulence, bloating, and abdominal pain. However, some of these symptoms are often caused by an intestinal disorder, such as irritable bowel syndrome, rather  than too much gas. . The most common ways to reduce the discomfort of gas are changing diet, taking nonprescription medicines, and reducing the amount of air swallowed. . Digestive enzymes, such as lactase supplements, actually help digest carbohydrates and may allow people to eat foods that normally cause gas.

## 2014-09-24 NOTE — Progress Notes (Signed)
cc'd to pcp 

## 2014-09-24 NOTE — Assessment & Plan Note (Signed)
SYMPTOMS FAIRLY WELL CONTROLLED WITH LINZESS. ASSOCIATED WITH BLOATING MOST LIKELY DUE TO DAIRY.  CONTINUE LINZESS. DRINK WATER TO KEEP YOUR URINE LIGHT YELLOW. EAT FIBER. CONTINUE YOUR WEIGHT LOSS EFFORTS. ADD LACTASE 3 PILLS WHEN EATING YOGURT. FOLLOW UP IN 4 MOS.

## 2014-12-03 DIAGNOSIS — R05 Cough: Secondary | ICD-10-CM | POA: Diagnosis not present

## 2014-12-03 DIAGNOSIS — R062 Wheezing: Secondary | ICD-10-CM | POA: Diagnosis not present

## 2014-12-03 DIAGNOSIS — N390 Urinary tract infection, site not specified: Secondary | ICD-10-CM | POA: Diagnosis not present

## 2014-12-17 DIAGNOSIS — Z87891 Personal history of nicotine dependence: Secondary | ICD-10-CM | POA: Diagnosis not present

## 2014-12-17 DIAGNOSIS — E876 Hypokalemia: Secondary | ICD-10-CM | POA: Diagnosis not present

## 2014-12-17 DIAGNOSIS — R1084 Generalized abdominal pain: Secondary | ICD-10-CM | POA: Diagnosis not present

## 2014-12-17 DIAGNOSIS — Z6838 Body mass index (BMI) 38.0-38.9, adult: Secondary | ICD-10-CM | POA: Diagnosis not present

## 2014-12-17 DIAGNOSIS — M797 Fibromyalgia: Secondary | ICD-10-CM | POA: Diagnosis not present

## 2014-12-17 DIAGNOSIS — Z79899 Other long term (current) drug therapy: Secondary | ICD-10-CM | POA: Diagnosis not present

## 2014-12-17 DIAGNOSIS — R51 Headache: Secondary | ICD-10-CM | POA: Diagnosis not present

## 2014-12-17 DIAGNOSIS — K5792 Diverticulitis of intestine, part unspecified, without perforation or abscess without bleeding: Secondary | ICD-10-CM | POA: Diagnosis not present

## 2014-12-17 DIAGNOSIS — M1990 Unspecified osteoarthritis, unspecified site: Secondary | ICD-10-CM | POA: Diagnosis not present

## 2014-12-17 DIAGNOSIS — R079 Chest pain, unspecified: Secondary | ICD-10-CM | POA: Diagnosis not present

## 2014-12-17 DIAGNOSIS — Z885 Allergy status to narcotic agent status: Secondary | ICD-10-CM | POA: Diagnosis not present

## 2014-12-17 DIAGNOSIS — Z7951 Long term (current) use of inhaled steroids: Secondary | ICD-10-CM | POA: Diagnosis not present

## 2014-12-17 DIAGNOSIS — E860 Dehydration: Secondary | ICD-10-CM | POA: Diagnosis not present

## 2014-12-17 DIAGNOSIS — R55 Syncope and collapse: Secondary | ICD-10-CM | POA: Diagnosis not present

## 2014-12-17 DIAGNOSIS — J441 Chronic obstructive pulmonary disease with (acute) exacerbation: Secondary | ICD-10-CM | POA: Diagnosis not present

## 2014-12-17 DIAGNOSIS — Z7982 Long term (current) use of aspirin: Secondary | ICD-10-CM | POA: Diagnosis not present

## 2014-12-17 DIAGNOSIS — K5732 Diverticulitis of large intestine without perforation or abscess without bleeding: Secondary | ICD-10-CM | POA: Diagnosis not present

## 2014-12-17 DIAGNOSIS — Z886 Allergy status to analgesic agent status: Secondary | ICD-10-CM | POA: Diagnosis not present

## 2014-12-18 DIAGNOSIS — E876 Hypokalemia: Secondary | ICD-10-CM | POA: Diagnosis not present

## 2014-12-18 DIAGNOSIS — J441 Chronic obstructive pulmonary disease with (acute) exacerbation: Secondary | ICD-10-CM | POA: Diagnosis not present

## 2014-12-18 DIAGNOSIS — Z7982 Long term (current) use of aspirin: Secondary | ICD-10-CM | POA: Diagnosis not present

## 2014-12-18 DIAGNOSIS — E860 Dehydration: Secondary | ICD-10-CM | POA: Diagnosis not present

## 2014-12-18 DIAGNOSIS — Z886 Allergy status to analgesic agent status: Secondary | ICD-10-CM | POA: Diagnosis not present

## 2014-12-18 DIAGNOSIS — Z6838 Body mass index (BMI) 38.0-38.9, adult: Secondary | ICD-10-CM | POA: Diagnosis not present

## 2014-12-18 DIAGNOSIS — K5732 Diverticulitis of large intestine without perforation or abscess without bleeding: Secondary | ICD-10-CM | POA: Diagnosis not present

## 2014-12-18 DIAGNOSIS — Z79899 Other long term (current) drug therapy: Secondary | ICD-10-CM | POA: Diagnosis not present

## 2014-12-18 DIAGNOSIS — Z87891 Personal history of nicotine dependence: Secondary | ICD-10-CM | POA: Diagnosis not present

## 2014-12-18 DIAGNOSIS — R55 Syncope and collapse: Secondary | ICD-10-CM | POA: Diagnosis not present

## 2014-12-18 DIAGNOSIS — M1990 Unspecified osteoarthritis, unspecified site: Secondary | ICD-10-CM | POA: Diagnosis not present

## 2014-12-18 DIAGNOSIS — M797 Fibromyalgia: Secondary | ICD-10-CM | POA: Diagnosis not present

## 2014-12-18 DIAGNOSIS — Z7951 Long term (current) use of inhaled steroids: Secondary | ICD-10-CM | POA: Diagnosis not present

## 2014-12-18 DIAGNOSIS — Z885 Allergy status to narcotic agent status: Secondary | ICD-10-CM | POA: Diagnosis not present

## 2014-12-19 DIAGNOSIS — Z79899 Other long term (current) drug therapy: Secondary | ICD-10-CM | POA: Diagnosis not present

## 2014-12-19 DIAGNOSIS — Z7951 Long term (current) use of inhaled steroids: Secondary | ICD-10-CM | POA: Diagnosis not present

## 2014-12-19 DIAGNOSIS — K5732 Diverticulitis of large intestine without perforation or abscess without bleeding: Secondary | ICD-10-CM | POA: Diagnosis not present

## 2014-12-19 DIAGNOSIS — M797 Fibromyalgia: Secondary | ICD-10-CM | POA: Diagnosis not present

## 2014-12-19 DIAGNOSIS — Z885 Allergy status to narcotic agent status: Secondary | ICD-10-CM | POA: Diagnosis not present

## 2014-12-19 DIAGNOSIS — E876 Hypokalemia: Secondary | ICD-10-CM | POA: Diagnosis not present

## 2014-12-19 DIAGNOSIS — E860 Dehydration: Secondary | ICD-10-CM | POA: Diagnosis not present

## 2014-12-19 DIAGNOSIS — Z6838 Body mass index (BMI) 38.0-38.9, adult: Secondary | ICD-10-CM | POA: Diagnosis not present

## 2014-12-19 DIAGNOSIS — J441 Chronic obstructive pulmonary disease with (acute) exacerbation: Secondary | ICD-10-CM | POA: Diagnosis not present

## 2014-12-19 DIAGNOSIS — Z886 Allergy status to analgesic agent status: Secondary | ICD-10-CM | POA: Diagnosis not present

## 2014-12-19 DIAGNOSIS — R55 Syncope and collapse: Secondary | ICD-10-CM | POA: Diagnosis not present

## 2014-12-19 DIAGNOSIS — Z87891 Personal history of nicotine dependence: Secondary | ICD-10-CM | POA: Diagnosis not present

## 2014-12-19 DIAGNOSIS — Z7982 Long term (current) use of aspirin: Secondary | ICD-10-CM | POA: Diagnosis not present

## 2014-12-19 DIAGNOSIS — M1990 Unspecified osteoarthritis, unspecified site: Secondary | ICD-10-CM | POA: Diagnosis not present

## 2014-12-24 DIAGNOSIS — Z01419 Encounter for gynecological examination (general) (routine) without abnormal findings: Secondary | ICD-10-CM | POA: Diagnosis not present

## 2014-12-24 DIAGNOSIS — Z1272 Encounter for screening for malignant neoplasm of vagina: Secondary | ICD-10-CM | POA: Diagnosis not present

## 2014-12-24 DIAGNOSIS — Z6837 Body mass index (BMI) 37.0-37.9, adult: Secondary | ICD-10-CM | POA: Diagnosis not present

## 2014-12-24 DIAGNOSIS — Z90710 Acquired absence of both cervix and uterus: Secondary | ICD-10-CM | POA: Diagnosis not present

## 2015-01-01 DIAGNOSIS — E782 Mixed hyperlipidemia: Secondary | ICD-10-CM | POA: Diagnosis not present

## 2015-01-01 DIAGNOSIS — R739 Hyperglycemia, unspecified: Secondary | ICD-10-CM | POA: Diagnosis not present

## 2015-01-08 DIAGNOSIS — R140 Abdominal distension (gaseous): Secondary | ICD-10-CM | POA: Diagnosis not present

## 2015-01-08 DIAGNOSIS — Z23 Encounter for immunization: Secondary | ICD-10-CM | POA: Diagnosis not present

## 2015-01-08 DIAGNOSIS — N838 Other noninflammatory disorders of ovary, fallopian tube and broad ligament: Secondary | ICD-10-CM | POA: Diagnosis not present

## 2015-01-08 DIAGNOSIS — E782 Mixed hyperlipidemia: Secondary | ICD-10-CM | POA: Diagnosis not present

## 2015-01-08 DIAGNOSIS — Z8719 Personal history of other diseases of the digestive system: Secondary | ICD-10-CM | POA: Diagnosis not present

## 2015-01-08 DIAGNOSIS — K8020 Calculus of gallbladder without cholecystitis without obstruction: Secondary | ICD-10-CM | POA: Diagnosis not present

## 2015-01-08 DIAGNOSIS — R102 Pelvic and perineal pain: Secondary | ICD-10-CM | POA: Diagnosis not present

## 2015-01-08 DIAGNOSIS — M129 Arthropathy, unspecified: Secondary | ICD-10-CM | POA: Diagnosis not present

## 2015-01-08 DIAGNOSIS — Z90710 Acquired absence of both cervix and uterus: Secondary | ICD-10-CM | POA: Diagnosis not present

## 2015-01-08 DIAGNOSIS — K589 Irritable bowel syndrome without diarrhea: Secondary | ICD-10-CM | POA: Diagnosis not present

## 2015-01-08 DIAGNOSIS — R109 Unspecified abdominal pain: Secondary | ICD-10-CM | POA: Diagnosis not present

## 2015-01-08 DIAGNOSIS — R1084 Generalized abdominal pain: Secondary | ICD-10-CM | POA: Diagnosis not present

## 2015-01-08 DIAGNOSIS — N281 Cyst of kidney, acquired: Secondary | ICD-10-CM | POA: Diagnosis not present

## 2015-03-01 IMAGING — CT CT ABD-PELV W/ CM
2 of 5 series · 15 of 46 positions shown, 17 images · IV contrast (Omnipaque 300)
Comparison: CT 06/19/2013

CLINICAL DATA: Worsening left lower quad abdominal pain, diarrhea.
History of diverticulitis.

EXAM:
CT ABDOMEN AND PELVIS WITH CONTRAST
TECHNIQUE: Multidetector CT imaging of the abdomen and pelvis was performed
using the standard protocol following bolus administration of
intravenous contrast.
CONTRAST:  50mL OMNIPAQUE IOHEXOL 300 MG/ML SOLN, 100mL OMNIPAQUE
IOHEXOL 300 MG/ML SOLN

[Series 2: abd_pel_with 5.0 b40f · axial · 0.74mm/px · z∈[-552,-107]mm · 12 of 101 slices shown, 14 images]
[im 6/101  soft-tissue]
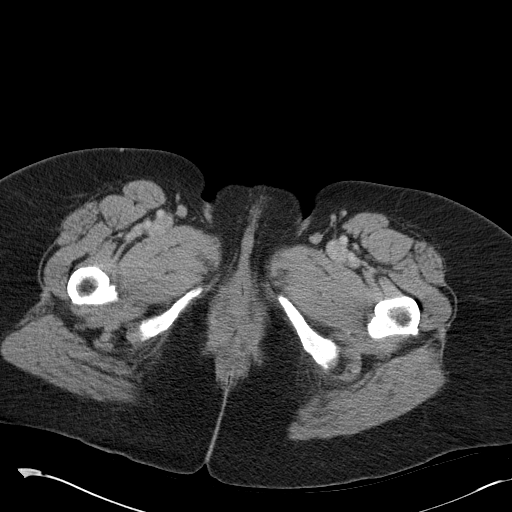
[im 6/101  bone]
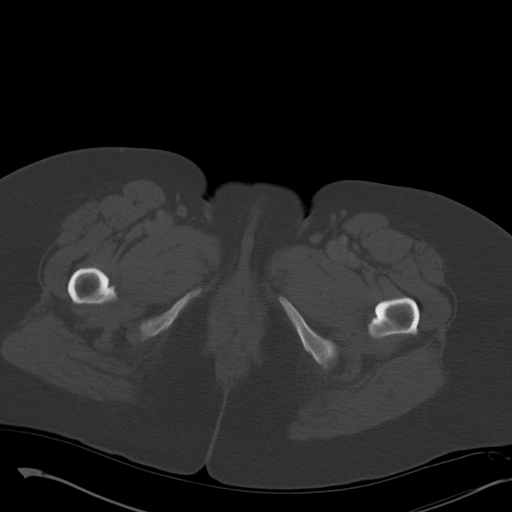
[im 17/101  soft-tissue]
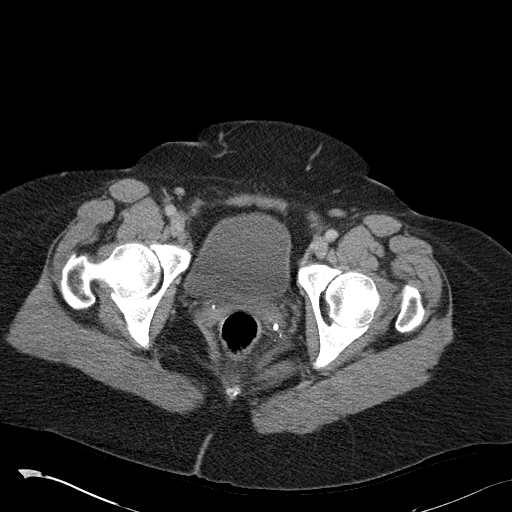
[im 23/101  soft-tissue]
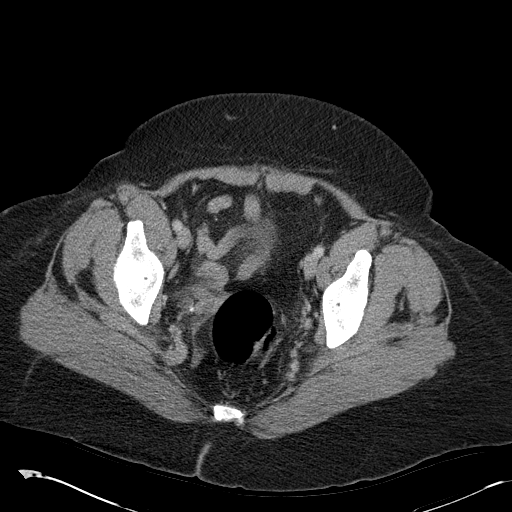
[im 28/101  soft-tissue]
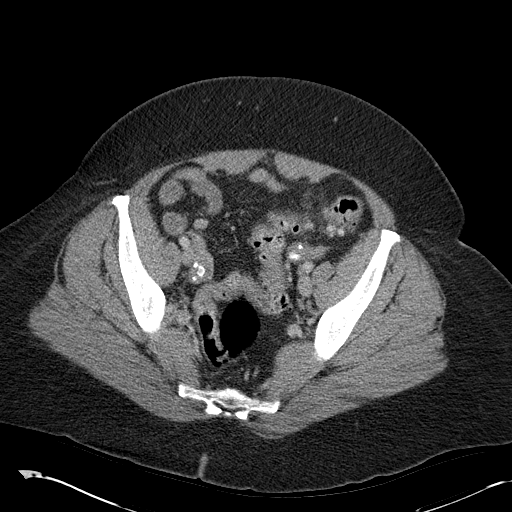
[im 39/101  soft-tissue]
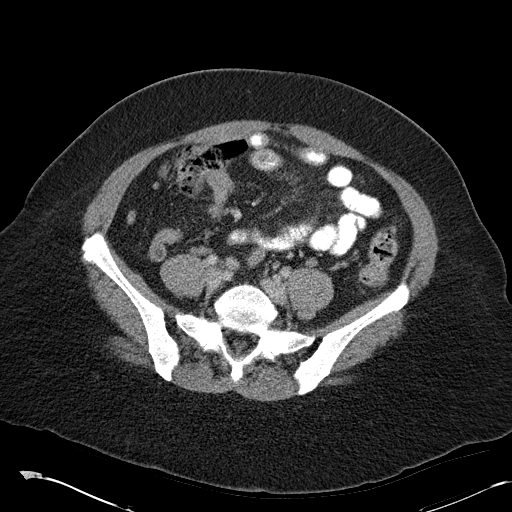
[im 45/101  soft-tissue]
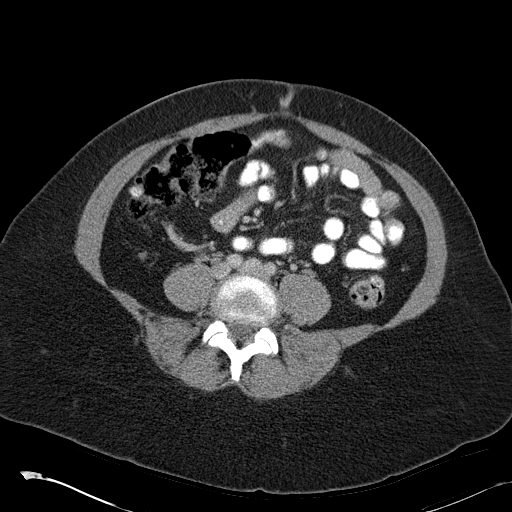
[im 56/101  soft-tissue]
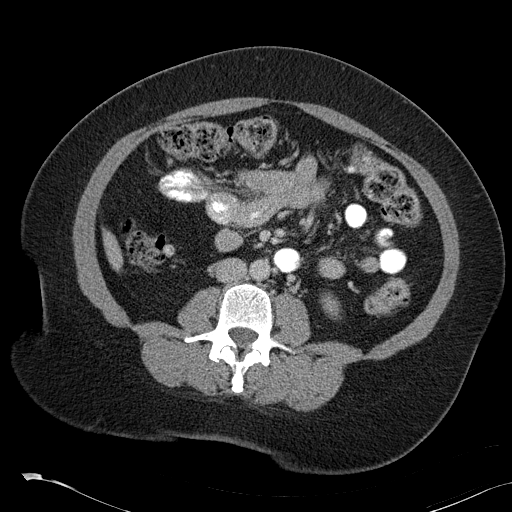
[im 62/101  soft-tissue]
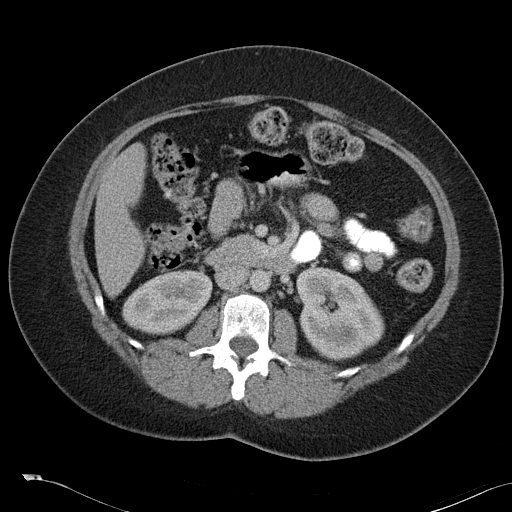
[im 73/101  soft-tissue]
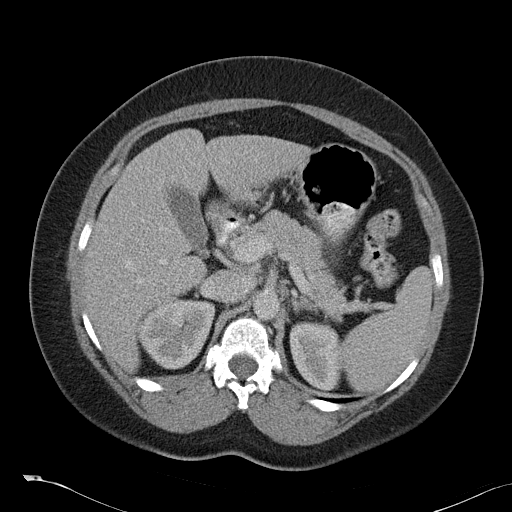
[im 73/101  bone]
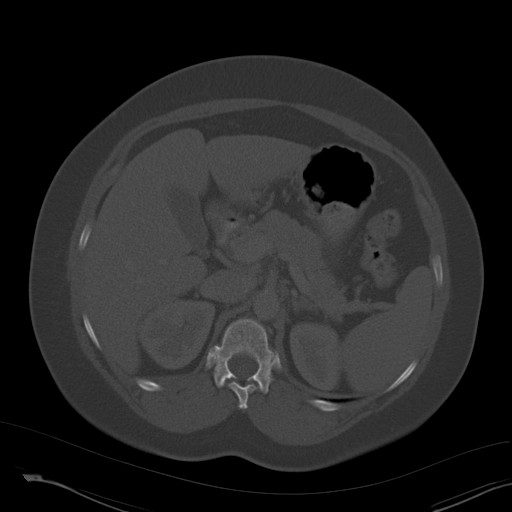
[im 78/101  soft-tissue]
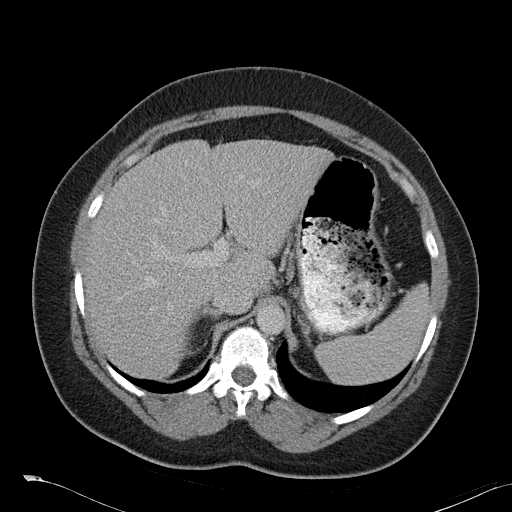
[im 84/101  soft-tissue]
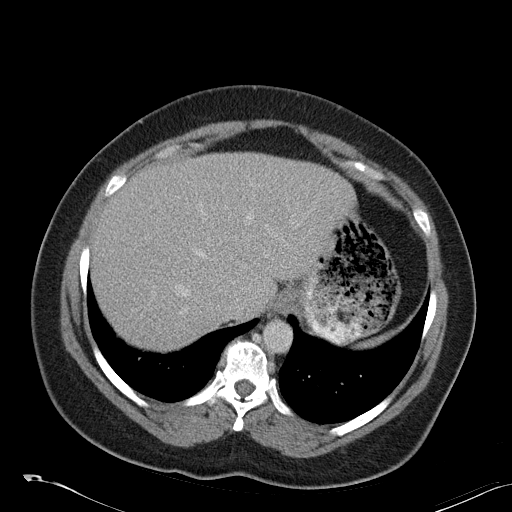
[im 95/101  soft-tissue]
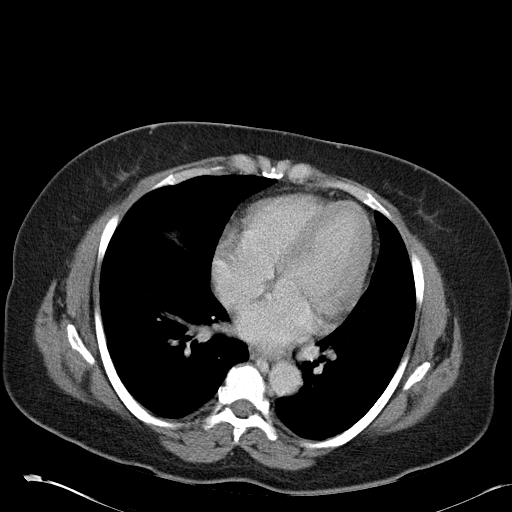

[Series 3: abd_pel_with 3.0 spo · coronal · 0.66mm/px · 3 of 109 slices shown]
[im 37/109  soft-tissue]
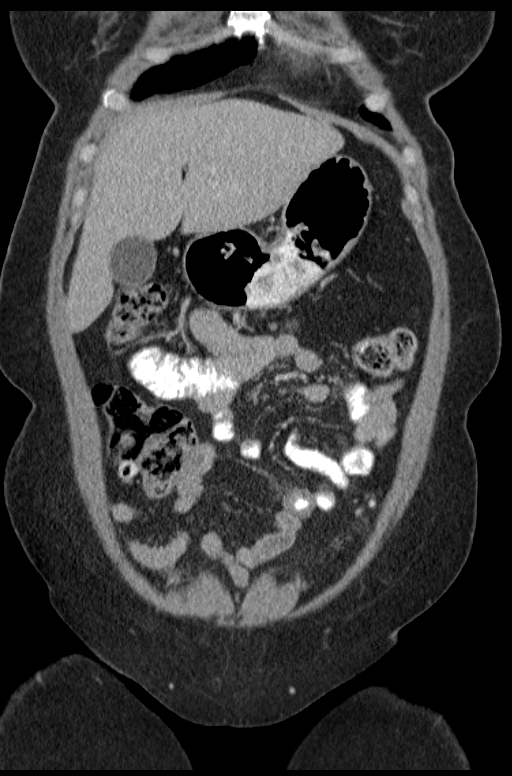
[im 49/109  soft-tissue]
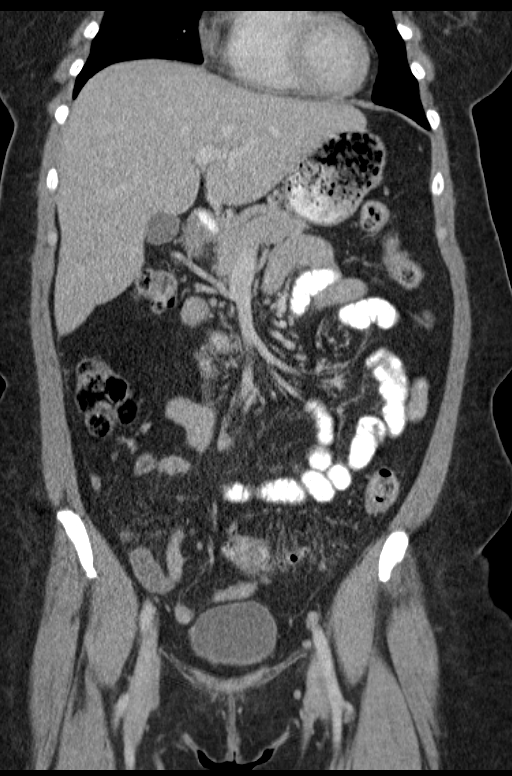
[im 61/109  soft-tissue]
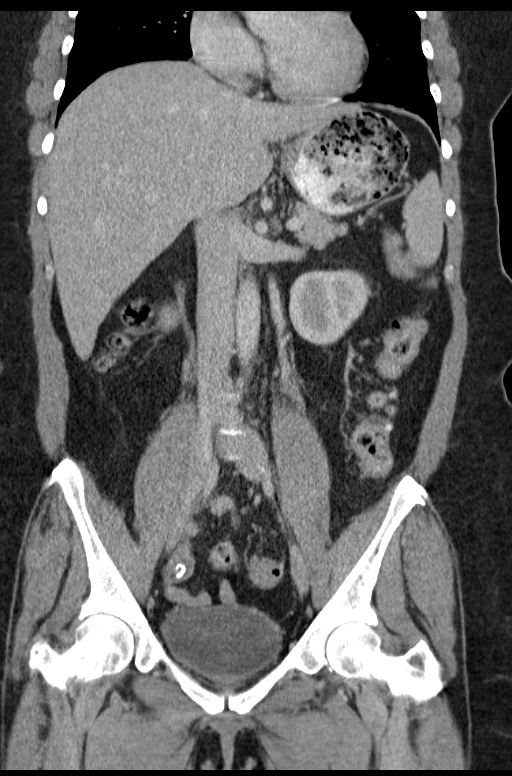

[15 of 46 positions shown; findings below may reference images not displayed]

FINDINGS: The lung bases are clear.

There is a moderate length segment of small bowel in the central
lower abdomen demonstrating bowel wall thickening and mild
associated mesenteric edema. No bowel dilatation or perforation.
Some oral contrast reaches distal to this. Multiple colonic
diverticula again seen in the descending and sigmoid colon.
Persistent but decreased fat stranding adjacent to diverticula in
the sigmoid colon in a distribution similar to that of prior. The
appendix is normal.

The liver, spleen, pancreas, gallbladder, and adrenal glands are
normal. There is symmetric renal enhancement and excretion.
Bilateral renal cysts are again seen, largest in the peripelvic left
kidney. There is no hydronephrosis or perinephric stranding.

No free air, free fluid, or intra-abdominal fluid collection. The
abdominal aorta is normal in caliber. There is no retroperitoneal
adenopathy.

Within the pelvis the urinary bladder is physiologically distended.
The uterus is surgically absent. There are coarse calcifications in
both adnexa that are similar to prior exam. No pelvic free fluid.
There are no acute or suspicious osseous abnormalities.
IMPRESSION: 1. Abnormal small bowel loop in the lower abdomen most consistent
with enteritis. Findings suggest an inflammatory or infectious
etiology.
2. Colonic diverticulosis. There is mild fat stranding adjacent to
the sigmoid colon in the left lower quadrant and appears similar to
prior exam, may reflect sequela of prior diverticulitis. There is no
evidence of acute inflammation at this time.
3. Incidental findings of bilateral renal cysts.

## 2015-03-11 DIAGNOSIS — R1012 Left upper quadrant pain: Secondary | ICD-10-CM | POA: Diagnosis not present

## 2015-03-11 DIAGNOSIS — R1030 Lower abdominal pain, unspecified: Secondary | ICD-10-CM | POA: Diagnosis not present

## 2015-03-11 DIAGNOSIS — R109 Unspecified abdominal pain: Secondary | ICD-10-CM | POA: Diagnosis not present

## 2015-03-11 DIAGNOSIS — K581 Irritable bowel syndrome with constipation: Secondary | ICD-10-CM | POA: Diagnosis not present

## 2015-03-12 ENCOUNTER — Ambulatory Visit (INDEPENDENT_AMBULATORY_CARE_PROVIDER_SITE_OTHER): Payer: Medicare Other | Admitting: Gastroenterology

## 2015-03-12 ENCOUNTER — Encounter: Payer: Self-pay | Admitting: Gastroenterology

## 2015-03-12 ENCOUNTER — Other Ambulatory Visit: Payer: Self-pay

## 2015-03-12 VITALS — BP 125/75 | HR 82 | Temp 97.3°F | Ht 63.0 in | Wt 220.8 lb

## 2015-03-12 DIAGNOSIS — K589 Irritable bowel syndrome without diarrhea: Secondary | ICD-10-CM | POA: Diagnosis not present

## 2015-03-12 DIAGNOSIS — K219 Gastro-esophageal reflux disease without esophagitis: Secondary | ICD-10-CM | POA: Diagnosis not present

## 2015-03-12 NOTE — Assessment & Plan Note (Addendum)
SYMPTOMS NOT IDEALLY CONTROLLED. LINZESS CAUSES EXPLOSIVE DIARRHEA.  DIFFERENTIAL DIAGNOSIS INCLUDES FOR CONSTIPATION NOT BEING CONTROLLED: COLON MASS, STRITCURE DUE TO PRIOR SURGERY/HYSTERECTOMY, OR FUNCTIONAL DISORDER DUE TO DIVERTICULOSIS.   TITRATE LINZESS DOSE. OPEN LINZESS CAPSULE. PLACE IN 4 TBSP WATER. STIR IT FOR 30 SEC AND TAKE 3 TBSP LIQUID DAILY 30 MINS BEFORE YOUR FIRST MEAL. IF IT STILL CAUSES DIARRHEA REDUCE DOSE TO 2 TBSP. TAKE ONLY THE LIQUID NOT THE GRANULES. DRINK WATER TO KEEP YOUR URINE LIGHT YELLOW. I PERSONALLY REVIEWED THE CT WITH DR. Thornton Papas.  FOLLOW A HIGH FIBER DIET.  COLONOSCOPY IN JAN 2017. PHENERGAN 12.5 MG IV IN PREOP. DISCUSSED PROCEDURE, BENEFITS, & RISKS: < 1% chance of medication reaction, bleeding, perforation, or rupture of spleen/liver.  FOLLOW UP IN 4 MOS. GREATER THAN 50% WAS SPENT IN COUNSELING & COORDINATION OF CARE WITH THE PATIENT: DISCUSSED DIFFERENTIAL DIAGNOSIS, PROCEDURE, BENEFITS, RISKS, AND MANAGEMENT OF IBS-C/DIVERTICULITIS/COLON CANCER SCREENING. TOTAL ENCOUNTER TIME: 40 MINS.

## 2015-03-12 NOTE — Progress Notes (Signed)
REVIEWED-NO ADDITIONAL RECOMMENDATIONS. 

## 2015-03-12 NOTE — Progress Notes (Signed)
ON RECALL  °

## 2015-03-12 NOTE — Assessment & Plan Note (Signed)
SYMPTOMS FAIRLY WELL CONTROLLED.  CONTINUE YOUR WEIGHT LOSS EFFORTS. LOSE 10 POUNDS. AVOID GERD TRIGGERS.  HANDOUT GIVEN. CONTINUE DEXILANT. FOLLOW UP IN 4 MOS.

## 2015-03-12 NOTE — Patient Instructions (Addendum)
TITRATE YOUR LINZESS DOSE. OPEN LINZESS CAPSULE. PLACE IN 4 TABLESPOONS(TBSP) WATER. STIR IT FOR 30 SEC AND TAKE 3 TABLESPOONS OF LIQUID DAILY 30 MINS BEFORE YOUR FIRST MEAL. TAKE ONLY THE LIQUID NOT THE GRANULES. IF IT STILL CAUSES DIARRHEA REDUCE DOSE TO 2 TBSP.   DRINK WATER TO KEEP YOUR URINE LIGHT YELLOW.  CONTINUE YOUR WEIGHT LOSS EFFORTS. LOSE 10 POUNDS.  FOLLOW A HIGH FIBER DIET/LOW FAT DIET. AVOID ITEMS THAT CAUSE BLOATING & GAS.  AVOID FOOD THAT TRIGGERS REFLUX. SEE INFO BELOW.   FOLLOW A FULL LIQUID DIET TWO DAYS BEFORE YOUR ENDOSCOPY AND CLEAR LIQUIDS THE DAY BEFORE COLONOSCOPY IN JAN 2017.    FOLLOW UP IN 4 MOS.  Lifestyle and home remedies TO MANAGE REFLUX/HEARTBURN  You may eliminate or reduce the frequency of heartburn by making the following lifestyle changes:  . Control your weight. Being overweight is a major risk factor for heartburn and GERD. Excess pounds put pressure on your abdomen, pushing up your stomach and causing acid to back up into your esophagus.   . Eat smaller meals. 4 TO 6 MEALS A DAY. This reduces pressure on the lower esophageal sphincter, helping to prevent the valve from opening and acid from washing back into your esophagus.   Dolphus Jenny your belt. Clothes that fit tightly around your waist put pressure on your abdomen and the lower esophageal sphincter.   . Eliminate heartburn triggers. Everyone has specific triggers. Common triggers such as fatty or fried foods, spicy food, tomato sauce, carbonated beverages, alcohol, chocolate, mint, garlic, onion, caffeine and nicotine may make heartburn worse.   Marland Kitchen Avoid stooping or bending. Tying your shoes is OK. Bending over for longer periods to weed your garden isn't, especially soon after eating.   . Don't lie down after a meal. Wait at least three to four hours after eating before going to bed, and don't lie down right after eating.   Alternative medicine . Several home remedies exist for treating  GERD, but they provide only temporary relief. They include drinking baking soda (sodium bicarbonate) added to water or drinking other fluids such as baking soda mixed with cream of tartar and water.  . Although these liquids create temporary relief by neutralizing, washing away or buffering acids, eventually they aggravate the situation by adding gas and fluid to your stomach, increasing pressure and causing more acid reflux. Further, adding more sodium to your diet may increase your blood pressure and add stress to your heart, and excessive bicarbonate ingestion can alter the acid-base balance in your body.   Low-Fat Diet BREADS, CEREALS, PASTA, RICE, DRIED PEAS, AND BEANS These products are high in carbohydrates and most are low in fat. Therefore, they can be increased in the diet as substitutes for fatty foods. They too, however, contain calories and should not be eaten in excess. Cereals can be eaten for snacks as well as for breakfast.   FRUITS AND VEGETABLES It is good to eat fruits and vegetables. Besides being sources of fiber, both are rich in vitamins and some minerals. They help you get the daily allowances of these nutrients. Fruits and vegetables can be used for snacks and desserts.  MEATS Limit lean meat, chicken, [place], and fish to no more than 6 ounces per day. Beef, Pork, and Lamb Use lean cuts of beef, pork, and lamb. Lean cuts include:  Extra-lean ground beef.  Arm roast.  Sirloin tip.  Center-cut ham.  Round steak.  Loin chops.  Rump roast.  Tenderloin.  Trim all fat off the outside of meats before cooking. It is not necessary to severely decrease the intake of red meat, but lean choices should be made. Lean meat is rich in protein and contains a highly absorbable form of iron. Premenopausal women, in particular, should avoid reducing lean red meat because this could increase the risk for low red blood cells (iron-deficiency anemia).  Chicken and [place] These are good  sources of protein. The fat of poultry can be reduced by removing the skin and underlying fat layers before cooking. Chicken and [place] can be substituted for lean red meat in the diet. Poultry should not be fried or covered with high-fat sauces. Fish and Shellfish Fish is a good source of protein. Shellfish contain cholesterol, but they usually are low in saturated fatty acids. The preparation of fish is important. Like chicken and [place], they should not be fried or covered with high-fat sauces. EGGS Egg whites contain no fat or cholesterol. They can be eaten often. Try 1 to 2 egg whites instead of whole eggs in recipes or use egg substitutes that do not contain yolk. MILK AND DAIRY PRODUCTS Use skim or 1% milk instead of 2% or whole milk. Decrease whole milk, natural, and processed cheeses. Use nonfat or low-fat (2%) cottage cheese or low-fat cheeses made from vegetable oils. Choose nonfat or low-fat (1 to 2%) yogurt. Experiment with evaporated skim milk in recipes that call for heavy cream. Substitute low-fat yogurt or low-fat cottage cheese for sour cream in dips and salad dressings. Have at least 2 servings of low-fat dairy products, such as 2 glasses of skim (or 1%) milk each day to help get your daily calcium intake. FATS AND OILS Reduce the total intake of fats, especially saturated fat. Butterfat, lard, and beef fats are high in saturated fat and cholesterol. These should be avoided as much as possible. Vegetable fats do not contain cholesterol, but certain vegetable fats, such as coconut oil, palm oil, and palm kernel oil are very high in saturated fats. These should be limited. These fats are often used in bakery goods, processed foods, popcorn, oils, and nondairy creamers. Vegetable shortenings and some peanut butters contain hydrogenated oils, which are also saturated fats. Read the labels on these foods and check for saturated vegetable oils. Unsaturated vegetable oils and fats do not raise  blood cholesterol. However, they should be limited because they are fats and are high in calories. Total fat should still be limited to 30% of your daily caloric intake. Desirable liquid vegetable oils are corn oil, cottonseed oil, olive oil, canola oil, safflower oil, soybean oil, and sunflower oil. Peanut oil is not as good, but small amounts are acceptable. Buy a heart-healthy tub margarine that has no partially hydrogenated oils in the ingredients. Mayonnaise and salad dressings often are made from unsaturated fats, but they should also be limited because of their high calorie and fat content. Seeds, nuts, peanut butter, olives, and avocados are high in fat, but the fat is mainly the unsaturated type. These foods should be limited mainly to avoid excess calories and fat. OTHER EATING TIPS Snacks  Most sweets should be limited as snacks. They tend to be rich in calories and fats, and their caloric content outweighs their nutritional value. Some good choices in snacks are graham crackers, melba toast, soda crackers, bagels (no egg), English muffins, fruits, and vegetables. These snacks are preferable to snack crackers, [place] fries, TORTILLA CHIPS, and POTATO chips. Popcorn should be air-popped or cooked  in small amounts of liquid vegetable oil. Desserts Eat fruit, low-fat yogurt, and fruit ices instead of pastries, cake, and cookies. Sherbet, angel food cake, gelatin dessert, frozen low-fat yogurt, or other frozen products that do not contain saturated fat (pure fruit juice bars, frozen ice pops) are also acceptable.  COOKING METHODS Choose those methods that use little or no fat. They include: Poaching.  Braising.  Steaming.  Grilling.  Baking.  Stir-frying.  Broiling.  Microwaving.  Foods can be cooked in a nonstick pan without added fat, or use a nonfat cooking spray in regular cookware. Limit fried foods and avoid frying in saturated fat. Add moisture to lean meats by using water, broth,  cooking wines, and other nonfat or low-fat sauces along with the cooking methods mentioned above. Soups and stews should be chilled after cooking. The fat that forms on top after a few hours in the refrigerator should be skimmed off. When preparing meals, avoid using excess salt. Salt can contribute to raising blood pressure in some people.  EATING AWAY FROM HOME Order entres, potatoes, and vegetables without sauces or butter. When meat exceeds the size of a deck of cards (3 to 4 ounces), the rest can be taken home for another meal. Choose vegetable or fruit salads and ask for low-calorie salad dressings to be served on the side. Use dressings sparingly. Limit high-fat toppings, such as bacon, crumbled eggs, cheese, sunflower seeds, and olives. Ask for heart-healthy tub margarine instead of butter.

## 2015-03-12 NOTE — Progress Notes (Signed)
Subjective:    Patient ID: Holly [name], female    DOB: 09-26-1955, 59 y.o.   MRN: [id number]  Gar Ponto, MD  HPI STAYS CONSTIPATION. TAKES LINZESS WHEN NEEDS TO GO TO BATHROOM. TAKES IT EVERY OTHER DAY DUE TO IT CAUSING DIARRHEA. IF SHE HAS TO GO SOMEWHERE. HAS CHRONIC LLQ/SIDE PAIN(ACHY, NO RADIATION) ESPECIALLY WHEN SHE NEEDS TO GO TO THE BR AND FEELS CONSTIPATED. ASSOCIATED WITH LOWER ABDOMINAL PRESSURE/BLOATING AND SUBJECTIVE CHILLS. PAIN BETTER AFTER BMs. HAD PELVIC U/S YESTERDAY-NAIAP. SOMETIMES FEELS CHILLS. TUES FELT NAUSEATED AND VOMITED(1-NO BLOOD) WHEN SHE COULD NOT HAVE A BM. DRINK WATER. EATS FIBER(Y[place]URT, [place]). GAINED WEIGHT: WEIGHT UP TO 224 LBS AND NOW WEIGHT COMING DOWN TO 220 LBS(MAR 2016 208 LBS).  PT DENIES FEVER, CHILLS, HEMATOCHEZIA, melena, CHEST PAIN, SHORTNESS OF BREATH, CHANGE IN BOWEL IN HABITS, problems swallowing, problems with sedation, OR heartburn or indigestion.  Past Medical History  Diagnosis Date  . Sinus problem   . Arthritis     knees  . Hay fever   . Diverticulitis   . GERD (gastroesophageal reflux disease)   . Headache(784.0)   . Diverticulosis   . Enteritis   . Constipation     chronic   Past Surgical History  Procedure Laterality Date  . Abdominal hysterectomy  2003    partial  . Tonsillectomy      age 38  . Total knee arthroplasty  01/30/2012    Procedure: TOTAL KNEE ARTHROPLASTY;  Surgeon: Gearlean Alf, MD;  Location: [facility];  Service: Orthopedics;  Laterality: Left;  . Foot surgery      bilateral -bone removal  . Knee closed reduction  04/02/2012    Procedure: CLOSED MANIPULATION KNEE;  Surgeon: Gearlean Alf, MD;  Location: [facility];  Service: Orthopedics;  Laterality: Left;  . Colonoscopy  02/2011    Dr. Doristine Mango: PROPOFOL. scattered diverticula, sessile polyp descending colon, path unavailable. recommenced five year follow up colonoscopy   Allergies  Allergen Reactions  . Dilaudid [Hydromorphone Hcl] Nausea  And Vomiting  . Codeine Nausea And Vomiting    fever  . Morphine And Related Nausea And Vomiting    fever   Current Outpatient Prescriptions  Medication Sig Dispense Refill  . dexlansoprazole (DEXILANT) 60 MG capsule 1 PO EVERY MORNING WITH BREAKFAST.    Marland Kitchen dicyclomine (BENTYL) 20 MG tablet Take 20 mg by mouth every 6 (six) hours. WHEN STOMACH CRAMPS   . diphenhydrAMINE (BENADRYL) 25 mg capsule Take 25 mg by mouth at bedtime as needed for allergies or sleep. For allergies.    . fexofenadine (ALLEGRA) 180 MG tablet Take 180 mg by mouth daily as needed for allergies.     Marland Kitchen Linaclotide (LINZESS) 145 MCG CAPS capsule Take one capsule daily on emtpy stomach.    . naproxen sodium (ANAPROX) 220 MG tablet Take 440 mg by mouth daily as needed (pain).    . ondansetron (ZOFRAN) 4 MG tablet Take 4 mg by mouth every 8 (eight) hours as needed for nausea or vomiting.    . Tetrahydrozoline HCl (VISINE OP) Place 1 drop into both eyes daily as needed (dry eyes).      Review of Systems PER HPI OTHERWISE ALL SYSTEMS ARE NEGATIVE.    Objective:   Physical Exam  Constitutional: She is oriented to person, place, and time. She appears well-developed and well-nourished. No distress.  HENT:  Head: Normocephalic and atraumatic.  Mouth/Throat: Oropharynx is clear and moist. No oropharyngeal exudate.  Eyes: Pupils are  equal, round, and reactive to light. No scleral icterus.  Neck: Normal range of motion. Neck supple.  Cardiovascular: Normal rate, regular rhythm and normal heart sounds.   Pulmonary/Chest: Effort normal and breath sounds normal. No respiratory distress.  Abdominal: Soft. Bowel sounds are normal. She exhibits distension (MILD). There is tenderness. There is no rebound and no guarding.  MILD LLQ TTP  Musculoskeletal: She exhibits no edema.  Lymphadenopathy:    She has no cervical adenopathy.  Neurological: She is alert and oriented to person, place, and time.  NO FOCAL DEFICITS  Psychiatric: She  has a normal mood and affect.  Vitals reviewed.     Assessment & Plan:

## 2015-03-12 NOTE — Progress Notes (Signed)
CC'ED TO PCP 

## 2015-03-12 NOTE — Addendum Note (Signed)
Addended by: Danie Binder on: 03/12/2015 11:49 AM   Modules accepted: Level of Service

## 2015-04-06 ENCOUNTER — Ambulatory Visit (HOSPITAL_COMMUNITY)
Admission: RE | Admit: 2015-04-06 | Discharge: 2015-04-06 | Disposition: A | Discharge status: Home / Self Care | Payer: Medicare Other | Source: Ambulatory Visit | Attending: Gastroenterology | Admitting: Gastroenterology

## 2015-04-06 ENCOUNTER — Encounter (HOSPITAL_COMMUNITY)
Admission: RE | Disposition: A | Discharge status: Home / Self Care | Payer: Self-pay | Source: Ambulatory Visit | Attending: Gastroenterology

## 2015-04-06 ENCOUNTER — Telehealth: Payer: Self-pay | Admitting: Gastroenterology

## 2015-04-06 ENCOUNTER — Encounter (HOSPITAL_COMMUNITY): Payer: Self-pay | Admitting: *Deleted

## 2015-04-06 DIAGNOSIS — K635 Polyp of colon: Secondary | ICD-10-CM | POA: Diagnosis not present

## 2015-04-06 DIAGNOSIS — K219 Gastro-esophageal reflux disease without esophagitis: Secondary | ICD-10-CM | POA: Diagnosis not present

## 2015-04-06 DIAGNOSIS — R194 Change in bowel habit: Secondary | ICD-10-CM

## 2015-04-06 DIAGNOSIS — D125 Benign neoplasm of sigmoid colon: Secondary | ICD-10-CM | POA: Diagnosis not present

## 2015-04-06 DIAGNOSIS — M13862 Other specified arthritis, left knee: Secondary | ICD-10-CM | POA: Insufficient documentation

## 2015-04-06 DIAGNOSIS — K589 Irritable bowel syndrome without diarrhea: Secondary | ICD-10-CM

## 2015-04-06 DIAGNOSIS — Z885 Allergy status to narcotic agent status: Secondary | ICD-10-CM | POA: Diagnosis not present

## 2015-04-06 DIAGNOSIS — K5730 Diverticulosis of large intestine without perforation or abscess without bleeding: Secondary | ICD-10-CM | POA: Insufficient documentation

## 2015-04-06 DIAGNOSIS — K648 Other hemorrhoids: Secondary | ICD-10-CM | POA: Diagnosis not present

## 2015-04-06 DIAGNOSIS — M13861 Other specified arthritis, right knee: Secondary | ICD-10-CM | POA: Insufficient documentation

## 2015-04-06 DIAGNOSIS — K5900 Constipation, unspecified: Secondary | ICD-10-CM | POA: Diagnosis present

## 2015-04-06 DIAGNOSIS — K581 Irritable bowel syndrome with constipation: Secondary | ICD-10-CM | POA: Insufficient documentation

## 2015-04-06 HISTORY — PX: COLONOSCOPY: SHX5424

## 2015-04-06 SURGERY — COLONOSCOPY
Anesthesia: Moderate Sedation

## 2015-04-06 MED ORDER — SODIUM CHLORIDE 0.9 % IV SOLN
INTRAVENOUS | Status: DC
Start: 2015-04-06 — End: 2015-04-06
  Administered 2015-04-06: 1000 mL via INTRAVENOUS

## 2015-04-06 MED ORDER — PROMETHAZINE HCL 25 MG/ML IJ SOLN
INTRAMUSCULAR | Status: DC | PRN
  Administered 2015-04-06: 12.5 mg via INTRAVENOUS

## 2015-04-06 MED ORDER — STERILE WATER FOR IRRIGATION IR SOLN
Status: DC | PRN
  Administered 2015-04-06: 12:00:00

## 2015-04-06 MED ORDER — MEPERIDINE HCL 100 MG/ML IJ SOLN
INTRAMUSCULAR | Status: DC | PRN
  Administered 2015-04-06: 50 mg via INTRAVENOUS
  Administered 2015-04-06: 25 mg via INTRAVENOUS

## 2015-04-06 MED ORDER — MIDAZOLAM HCL 5 MG/5ML IJ SOLN
INTRAMUSCULAR | Status: AC
  Filled 2015-04-06: qty 10

## 2015-04-06 MED ORDER — MIDAZOLAM HCL 5 MG/5ML IJ SOLN
INTRAMUSCULAR | Status: DC | PRN
  Administered 2015-04-06: 2 mg via INTRAVENOUS
  Administered 2015-04-06 (×2): 1 mg via INTRAVENOUS

## 2015-04-06 MED ORDER — SODIUM CHLORIDE 0.9 % IJ SOLN
INTRAMUSCULAR | Status: AC
  Filled 2015-04-06: qty 3

## 2015-04-06 MED ORDER — PROMETHAZINE HCL 25 MG/ML IJ SOLN
INTRAMUSCULAR | Status: AC
  Filled 2015-04-06: qty 1

## 2015-04-06 MED ORDER — MEPERIDINE HCL 100 MG/ML IJ SOLN
INTRAMUSCULAR | Status: AC
  Filled 2015-04-06: qty 2

## 2015-04-06 NOTE — H&P (View-Only) (Signed)
Subjective:    Patient ID: Holly [name], female    DOB: 1956/03/04, 60 y.o.   MRN: [id number]  Gar Ponto, MD  HPI STAYS CONSTIPATION. TAKES LINZESS WHEN NEEDS TO GO TO BATHROOM. TAKES IT EVERY OTHER DAY DUE TO IT CAUSING DIARRHEA. IF SHE HAS TO GO SOMEWHERE. HAS CHRONIC LLQ/SIDE PAIN(ACHY, NO RADIATION) ESPECIALLY WHEN SHE NEEDS TO GO TO THE BR AND FEELS CONSTIPATED. ASSOCIATED WITH LOWER ABDOMINAL PRESSURE/BLOATING AND SUBJECTIVE CHILLS. PAIN BETTER AFTER BMs. HAD PELVIC U/S YESTERDAY-NAIAP. SOMETIMES FEELS CHILLS. TUES FELT NAUSEATED AND VOMITED(1-NO BLOOD) WHEN SHE COULD NOT HAVE A BM. DRINK WATER. EATS FIBER(YOGURT, [place]). GAINED WEIGHT: WEIGHT UP TO 224 LBS AND NOW WEIGHT COMING DOWN TO 220 LBS(MAR 2016 208 LBS).  PT DENIES FEVER, CHILLS, HEMATOCHEZIA, melena, CHEST PAIN, SHORTNESS OF BREATH, CHANGE IN BOWEL IN HABITS, problems swallowing, problems with sedation, OR heartburn or indigestion.  Past Medical History  Diagnosis Date  . Sinus problem   . Arthritis     knees  . Hay fever   . Diverticulitis   . GERD (gastroesophageal reflux disease)   . Headache(784.0)   . Diverticulosis   . Enteritis   . Constipation     chronic   Past Surgical History  Procedure Laterality Date  . Abdominal hysterectomy  2003    partial  . Tonsillectomy      age 12  . Total knee arthroplasty  01/30/2012    Procedure: TOTAL KNEE ARTHROPLASTY;  Surgeon: Gearlean Alf, MD;  Location: [facility];  Service: Orthopedics;  Laterality: Left;  . Foot surgery      bilateral -bone removal  . Knee closed reduction  04/02/2012    Procedure: CLOSED MANIPULATION KNEE;  Surgeon: Gearlean Alf, MD;  Location: [facility];  Service: Orthopedics;  Laterality: Left;  . Colonoscopy  02/2011    Dr. Doristine Mango: PROPOFOL. scattered diverticula, sessile polyp descending colon, path unavailable. recommenced five year follow up colonoscopy   Allergies  Allergen Reactions  . Dilaudid [Hydromorphone Hcl] Nausea  And Vomiting  . Codeine Nausea And Vomiting    fever  . Morphine And Related Nausea And Vomiting    fever   Current Outpatient Prescriptions  Medication Sig Dispense Refill  . dexlansoprazole (DEXILANT) 60 MG capsule 1 PO EVERY MORNING WITH BREAKFAST.    Marland Kitchen dicyclomine (BENTYL) 20 MG tablet Take 20 mg by mouth every 6 (six) hours. WHEN STOMACH CRAMPS   . diphenhydrAMINE (BENADRYL) 25 mg capsule Take 25 mg by mouth at bedtime as needed for allergies or sleep. For allergies.    . fexofenadine (ALLEGRA) 180 MG tablet Take 180 mg by mouth daily as needed for allergies.     Marland Kitchen Linaclotide (LINZESS) 145 MCG CAPS capsule Take one capsule daily on emtpy stomach.    . naproxen sodium (ANAPROX) 220 MG tablet Take 440 mg by mouth daily as needed (pain).    . ondansetron (ZOFRAN) 4 MG tablet Take 4 mg by mouth every 8 (eight) hours as needed for nausea or vomiting.    . Tetrahydrozoline HCl (VISINE OP) Place 1 drop into both eyes daily as needed (dry eyes).      Review of Systems PER HPI OTHERWISE ALL SYSTEMS ARE NEGATIVE.    Objective:   Physical Exam  Constitutional: She is oriented to person, place, and time. She appears well-developed and well-nourished. No distress.  HENT:  Head: Normocephalic and atraumatic.  Mouth/Throat: Oropharynx is clear and moist. No oropharyngeal exudate.  Eyes: Pupils are  equal, round, and reactive to light. No scleral icterus.  Neck: Normal range of motion. Neck supple.  Cardiovascular: Normal rate, regular rhythm and normal heart sounds.   Pulmonary/Chest: Effort normal and breath sounds normal. No respiratory distress.  Abdominal: Soft. Bowel sounds are normal. She exhibits distension (MILD). There is tenderness. There is no rebound and no guarding.  MILD LLQ TTP  Musculoskeletal: She exhibits no edema.  Lymphadenopathy:    She has no cervical adenopathy.  Neurological: She is alert and oriented to person, place, and time.  NO FOCAL DEFICITS  Psychiatric: She  has a normal mood and affect.  Vitals reviewed.     Assessment & Plan:

## 2015-04-06 NOTE — Discharge Instructions (Signed)
You had 2 small polyps removed. You have LARGE internal hemorrhoids and diverticulosis IN YOUR RIGHT AND LEFT COLON.   FOLLOW A HIGH FIBER DIET. AVOID ITEMS THAT CAUSE BLOATING AND PAIN. SEE INFO BELOW.  OPEN LINZESS CAPSULE. PLACE IN 4 TABLESPOONS(TBSP) WATER. STIR IT FOR 30 SEC AND TAKE 3 TABLESPOONS OF LIQUID DAILY 30 MINS BEFORE YOUR FIRST MEAL. TAKE ONLY THE LIQUID NOT THE GRANULES. IF IT STILL CAUSES DIARRHEA REDUCE DOSE TO 2 TBSP.   DRINK WATER TO KEEP YOUR URINE LIGHT YELLOW.  CONTINUE YOUR WEIGHT LOSS EFFORTS. LOSE 10 POUNDS.  YOUR BIOPSY RESULTS WILL BE AVAILABLE IN MY CHART AFTER JAN 11 AND MY OFFICE WILL CONTACT YOU IN 10-14 DAYS WITH YOUR RESULTS.   FOLLOW UP IN April 2017.   Next colonoscopy in 5-10 years.     Colonoscopy Care After Read the instructions outlined below and refer to this sheet in the next week. These discharge instructions provide you with general information on caring for yourself after you leave the hospital. While your treatment has been planned according to the most current medical practices available, unavoidable complications occasionally occur. If you have any problems or questions after discharge, call DR. FIELDS, [phone].  ACTIVITY  You may resume your regular activity, but move at a slower pace for the next 24 hours.   Take frequent rest periods for the next 24 hours.   Walking will help get rid of the air and reduce the bloated feeling in your belly (abdomen).   No driving for 24 hours (because of the medicine (anesthesia) used during the test).   You may shower.   Do not sign any important legal documents or operate any machinery for 24 hours (because of the anesthesia used during the test).    NUTRITION  Drink plenty of fluids.   You may resume your normal diet as instructed by your doctor.   Begin with a light meal and progress to your normal diet. Heavy or fried foods are harder to digest and may make you feel sick to your  stomach (nauseated).   Avoid alcoholic beverages for 24 hours or as instructed.    MEDICATIONS  You may resume your normal medications.   WHAT YOU CAN EXPECT TODAY  Some feelings of bloating in the abdomen.   Passage of more gas than usual.   Spotting of blood in your stool or on the toilet paper  .  IF YOU HAD POLYPS REMOVED DURING THE COLONOSCOPY:  Eat a soft diet IF YOU HAVE NAUSEA, BLOATING, ABDOMINAL PAIN, OR VOMITING.    FINDING OUT THE RESULTS OF YOUR TEST Not all test results are available during your visit. DR. Oneida Alar WILL CALL YOU WITHIN 14 DAYS OF YOUR PROCEDUE WITH YOUR RESULTS. Do not assume everything is normal if you have not heard from DR. FIELDS, CALL HER OFFICE AT [phone].  SEEK IMMEDIATE MEDICAL ATTENTION AND CALL THE OFFICE: [phone] IF:  You have more than a spotting of blood in your stool.   Your belly is swollen (abdominal distention).   You are nauseated or vomiting.   You have a temperature over 101F.   You have abdominal pain or discomfort that is severe or gets worse throughout the day.  Polyps, Colon  A polyp is extra tissue that grows inside your body. Colon polyps grow in the large intestine. The large intestine, also called the colon, is part of your digestive system. It is a long, hollow tube at the end of your digestive  tract where your body makes and stores stool. Most polyps are not dangerous. They are benign. This means they are not cancerous. But over time, some types of polyps can turn into cancer. Polyps that are smaller than a pea are usually not harmful. But larger polyps could someday become or may already be cancerous. To be safe, doctors remove all polyps and test them.   PREVENTION There is not one sure way to prevent polyps. You might be able to lower your risk of getting them if you:  Eat more fruits and vegetables and less fatty food.   Do not smoke.   Avoid alcohol.   Exercise every day.   Lose weight if  you are overweight.   Eating more calcium and folate can also lower your risk of getting polyps. Some foods that are rich in calcium are milk, cheese, and broccoli. Some foods that are rich in folate are chickpeas, kidney beans, and spinach.   High-Fiber Diet A high-fiber diet changes your normal diet to include more whole grains, legumes, fruits, and vegetables. Changes in the diet involve replacing refined carbohydrates with unrefined foods. The calorie level of the diet is essentially unchanged. The Dietary Reference Intake (recommended amount) for adult males is 38 grams per day. For adult females, it is 25 grams per day. Pregnant and lactating women should consume 28 grams of fiber per day. Fiber is the intact part of a plant that is not broken down during digestion. Functional fiber is fiber that has been isolated from the plant to provide a beneficial effect in the body. PURPOSE  Increase stool bulk.   Ease and regulate bowel movements.   Lower cholesterol.  REDUCE RISK OF COLON CANCER  INDICATIONS THAT YOU NEED MORE FIBER  Constipation and hemorrhoids.   Uncomplicated diverticulosis (intestine condition) and irritable bowel syndrome.   Weight management.   As a protective measure against hardening of the arteries (atherosclerosis), diabetes, and cancer.   GUIDELINES FOR INCREASING FIBER IN THE DIET  Start adding fiber to the diet slowly. A gradual increase of about 5 more grams (2 slices of whole-wheat bread, 2 servings of most fruits or vegetables, or 1 bowl of high-fiber cereal) per day is best. Too rapid an increase in fiber may result in constipation, flatulence, and bloating.   Drink enough water and fluids to keep your urine clear or pale yellow. Water, juice, or caffeine-free drinks are recommended. Not drinking enough fluid may cause constipation.   Eat a variety of high-fiber foods rather than one type of fiber.   Try to increase your intake of fiber through using  high-fiber foods rather than fiber pills or supplements that contain small amounts of fiber.   The goal is to change the types of food eaten. Do not supplement your present diet with high-fiber foods, but replace foods in your present diet.   INCLUDE A VARIETY OF FIBER SOURCES  Replace refined and processed grains with whole grains, canned fruits with fresh fruits, and incorporate other fiber sources. White rice, white breads, and most bakery goods contain little or no fiber.   Brown whole-grain rice, buckwheat oats, and many fruits and vegetables are all good sources of fiber. These include: broccoli, Brussels sprouts, cabbage, cauliflower, beets, sweet potatoes, white potatoes (skin on), carrots, tomatoes, eggplant, squash, berries, fresh fruits, and dried fruits.   Cereals appear to be the richest source of fiber. Cereal fiber is found in whole grains and bran. Bran is the fiber-rich outer coat of  cereal grain, which is largely removed in refining. In whole-grain cereals, the bran remains. In breakfast cereals, the largest amount of fiber is found in those with "bran" in their names. The fiber content is sometimes indicated on the label.   You may need to include additional fruits and vegetables each day.   In baking, for 1 cup white flour, you may use the following substitutions:   1 cup whole-wheat flour minus 2 tablespoons.   1/2 cup white flour plus 1/2 cup whole-wheat flour.   Diverticulosis Diverticulosis is a common condition that develops when small pouches (diverticula) form in the wall of the colon. The risk of diverticulosis increases with age. It happens more often in people who eat a low-fiber diet. Most individuals with diverticulosis have no symptoms. Those individuals with symptoms usually experience belly (abdominal) pain, constipation, or loose stools (diarrhea).  HOME CARE INSTRUCTIONS  Increase the amount of fiber in your diet as directed by your caregiver or  dietician. This may reduce symptoms of diverticulosis.   Drink at least 6 to 8 glasses of water each day to prevent constipation.   Try not to strain when you have a bowel movement.   Avoiding nuts and seeds to prevent complications is NOT NECESSARY.   FOODS HAVING HIGH FIBER CONTENT INCLUDE:  Fruits. Apple, peach, pear, tangerine, raisins, prunes.   Vegetables. Brussels sprouts, asparagus, broccoli, cabbage, carrot, cauliflower, romaine lettuce, spinach, summer squash, tomato, winter squash, zucchini.   Starchy Vegetables. Baked beans, kidney beans, lima beans, split peas, lentils, potatoes (with skin).   Grains. Whole wheat bread, brown rice, bran flake cereal, plain oatmeal, white rice, shredded wheat, bran muffins.   SEEK IMMEDIATE MEDICAL CARE IF:  You develop increasing pain or severe bloating.   You have an oral temperature above 101F.   You develop vomiting or bowel movements that are bloody or black.   Hemorrhoids Hemorrhoids are dilated (enlarged) veins around the rectum. Sometimes clots will form in the veins. This makes them swollen and painful. These are called thrombosed hemorrhoids. Causes of hemorrhoids include:  Constipation.   Straining to have a bowel movement.   HEAVY LIFTING HOME CARE INSTRUCTIONS  Eat a well balanced diet and drink 6 to 8 glasses of water every day to avoid constipation. You may also use a bulk laxative.   Avoid straining to have bowel movements.   Keep anal area dry and clean.   Do not use a donut shaped pillow or sit on the toilet for long periods. This increases blood pooling and pain.   Move your bowels when your body has the urge; this will require less straining and will decrease pain and pressure.

## 2015-04-06 NOTE — Interval H&P Note (Signed)
History and Physical Interval Note:  04/06/2015 11:29 AM  Holly [name]  has presented today for surgery, with the diagnosis of Irritable bowel syndrome  The various methods of treatment have been discussed with the patient and family. After consideration of risks, benefits and other options for treatment, the patient has consented to  Procedure(s) with comments: COLONOSCOPY (N/A) - 1115  as a surgical intervention .  The patient's history has been reviewed, patient examined, no change in status, stable for surgery.  I have reviewed the patient's chart and labs.  Questions were answered to the patient's satisfaction.     [employer]

## 2015-04-06 NOTE — Op Note (Signed)
[facility] [address] [place], [address]   COLONOSCOPY PROCEDURE REPORT  PATIENT: Holly, [name]  MR#: [id number] BIRTHDATE: 07-Sep-1955 , 24  yrs. old GENDER: female ENDOSCOPIST: Danie Binder, MD REFERRED [id number] Holly [name], M.D. PROCEDURE DATE:  2015-04-22 PROCEDURE:   Colonoscopy with cold biopsy polypectomy INDICATIONS:change in bowel habits-CONSTIPATION WORSE. MEDICATIONS: Promethazine (Phenergan) 12.5 mg IV, Demerol 75 mg IV, and Versed 4 mg IV  MD ORDERED/INITIATED SEDATION:  START-1135, STOP: [id number]  DESCRIPTION OF PROCEDURE:    Physical exam was performed.  Informed consent was obtained from the patient after explaining the benefits, risks, and alternatives to procedure.  The patient was connected to monitor and placed in left lateral position. Continuous oxygen was provided by nasal cannula and IV medicine administered through an indwelling cannula.  After administration of sedation and rectal exam, the patients rectum was intubated and the [id number]  colonoscope was advanced under direct visualization to the cecum.  The scope was removed slowly by carefully examining the color, texture, anatomy, and integrity mucosa on the way out.  The patient was recovered in endoscopy and discharged home in satisfactory condition. Estimated blood loss is zero unless otherwise noted in this procedure report.    COLON FINDINGS: The colon was redundant.  Manual abdominal counter-pressure was used to reach the cecum, There was moderate diverticulosis noted throughout the entire examined colon with associated tortuosity and muscular hypertrophy.  , Two sessile polyps measuring 3 mm in size were found in the sigmoid colon.  A polypectomy was performed with cold forceps.  , and Moderate sized internal hemorrhoids were found.  PREP QUALITY: good.  CECAL W/D TIME: 14       minutes COMPLICATIONS: None  ENDOSCOPIC IMPRESSION: 1.   The LEFT colon IS SLIGHTLY  redundant 2.   Moderate diverticulosis throughout the entire examined colon 3.   Two COLON polyps REMOVED 4.   Moderate sized internal hemorrhoids  RECOMMENDATIONS: HIGH FIBER DIET-AVOID ITEMS THAT CAUSE BLOATING/PAIN. OPEN LINZESS CAPSULE.  PLACE IN 4 TBSP WATER.  STIR IT FOR 30 SEC AND TAKE 3 TBSP OF THE LIQUID DAILY 30 MINS BEFORE FIRST MEAL. TAKE ONLY THE LIQUID NOT THE GRANULES.  FOR DIARRHEA REDUCE DOSE TO 2 TBSP. DRINK WATER TO KEEP URINE LIGHT YELLOW. CONTINUE WEIGHT LOSS EFFORTS.  LOSE 10 POUNDS. AWAIT BIOPSY RESULTS. FOLLOW UP IN April 2017. Next colonoscopy in 5-10 years.  eSigned:  Danie Binder, MD 04-22-15 2:12 PM CPT CODES: ICD CODES:  The ICD and CPT codes recommended by this software are interpretations from the data that the clinical staff has captured with the software.  The verification of the translation of this report to the ICD and CPT codes and modifiers is the sole responsibility of the health care institution and practicing physician where this report was generated.  [employer] will not be held responsible for the validity of the ICD and CPT codes included on this report.  AMA assumes no liability for data contained or not contained herein. CPT is a Designer, television/film set of the [employer].

## 2015-04-06 NOTE — Telephone Encounter (Signed)
LINZESS 145 MCG

## 2015-04-06 NOTE — Telephone Encounter (Signed)
Christine from [facility] called to make follow up in April and next colonoscopy in 5-10 years. She also said patient would need samples of Linzess.

## 2015-04-06 NOTE — Telephone Encounter (Signed)
Note note in epic yet so that I can see what strengh Linzess to give pt.

## 2015-04-06 NOTE — Telephone Encounter (Signed)
Is pt to still have the Linzess 145 mcg?

## 2015-04-07 NOTE — Telephone Encounter (Signed)
#   boxes of Linzess 145 mcg at front for pt and she is aware to use per Dr. Oneida Alar instructions at the hospital.

## 2015-04-07 NOTE — Telephone Encounter (Signed)
3 boxes, #12 tablets.

## 2015-04-10 ENCOUNTER — Encounter (HOSPITAL_COMMUNITY): Payer: Self-pay | Admitting: Gastroenterology

## 2015-04-16 ENCOUNTER — Telehealth: Payer: Self-pay | Admitting: Gastroenterology

## 2015-04-16 NOTE — Telephone Encounter (Signed)
Tried to call with no answer  

## 2015-04-16 NOTE — Telephone Encounter (Signed)
Please call pt. She had ONE simple adenoma AND ONE HYPERPLASTIC POLYP REMOVED.   FOLLOW A HIGH FIBER DIET. AVOID ITEMS THAT CAUSE BLOATING AND PAIN.  OPEN LINZESS CAPSULE. PLACE IN 4 TABLESPOONS(TBSP) WATER. STIR IT FOR 30 SEC AND TAKE 3 TABLESPOONS OF LIQUID DAILY 30 MINS BEFORE YOUR FIRST MEAL. TAKE ONLY THE LIQUID NOT THE GRANULES. IF IT STILL CAUSES DIARRHEA REDUCE DOSE TO 2 TBSP.   DRINK WATER TO KEEP YOUR URINE LIGHT YELLOW. CONTINUE YOUR WEIGHT LOSS EFFORTS. LOSE 10 POUNDS.  FOLLOW UP IN April 2017 E30 CONSTIPATION/IBS, GERD.   Next colonoscopy in 5-10 years.

## 2015-04-17 NOTE — Telephone Encounter (Signed)
Called pt No answer. Mailed letter

## 2015-04-17 NOTE — Telephone Encounter (Signed)
On recall  °

## 2015-04-20 NOTE — Telephone Encounter (Signed)
Noted  

## 2015-04-22 NOTE — Telephone Encounter (Signed)
Pt called and is aware of results.  

## 2015-06-16 ENCOUNTER — Encounter: Payer: Self-pay | Admitting: Gastroenterology

## 2015-07-09 DIAGNOSIS — E782 Mixed hyperlipidemia: Secondary | ICD-10-CM | POA: Diagnosis not present

## 2015-07-09 DIAGNOSIS — J301 Allergic rhinitis due to pollen: Secondary | ICD-10-CM | POA: Diagnosis not present

## 2015-07-09 DIAGNOSIS — R739 Hyperglycemia, unspecified: Secondary | ICD-10-CM | POA: Diagnosis not present

## 2015-07-09 DIAGNOSIS — R062 Wheezing: Secondary | ICD-10-CM | POA: Diagnosis not present

## 2015-07-09 DIAGNOSIS — M25562 Pain in left knee: Secondary | ICD-10-CM | POA: Diagnosis not present

## 2015-08-28 DIAGNOSIS — R062 Wheezing: Secondary | ICD-10-CM | POA: Diagnosis not present

## 2015-08-28 DIAGNOSIS — J4521 Mild intermittent asthma with (acute) exacerbation: Secondary | ICD-10-CM | POA: Diagnosis not present

## 2015-09-07 DIAGNOSIS — J4531 Mild persistent asthma with (acute) exacerbation: Secondary | ICD-10-CM | POA: Diagnosis not present

## 2015-09-07 DIAGNOSIS — R062 Wheezing: Secondary | ICD-10-CM | POA: Diagnosis not present

## 2015-09-28 DIAGNOSIS — R0982 Postnasal drip: Secondary | ICD-10-CM | POA: Diagnosis not present

## 2015-09-28 DIAGNOSIS — J4530 Mild persistent asthma, uncomplicated: Secondary | ICD-10-CM | POA: Diagnosis not present

## 2015-10-08 ENCOUNTER — Other Ambulatory Visit: Payer: Self-pay | Admitting: Gastroenterology

## 2015-12-01 ENCOUNTER — Other Ambulatory Visit: Payer: Self-pay | Admitting: Gastroenterology

## 2015-12-31 DIAGNOSIS — H5203 Hypermetropia, bilateral: Secondary | ICD-10-CM | POA: Diagnosis not present

## 2016-01-29 DIAGNOSIS — Z23 Encounter for immunization: Secondary | ICD-10-CM | POA: Diagnosis not present

## 2016-03-08 DIAGNOSIS — E782 Mixed hyperlipidemia: Secondary | ICD-10-CM | POA: Diagnosis not present

## 2016-03-08 DIAGNOSIS — J209 Acute bronchitis, unspecified: Secondary | ICD-10-CM | POA: Diagnosis not present

## 2016-03-08 DIAGNOSIS — Z23 Encounter for immunization: Secondary | ICD-10-CM | POA: Diagnosis not present

## 2016-03-08 DIAGNOSIS — R600 Localized edema: Secondary | ICD-10-CM | POA: Diagnosis not present

## 2016-03-08 DIAGNOSIS — J4530 Mild persistent asthma, uncomplicated: Secondary | ICD-10-CM | POA: Diagnosis not present

## 2016-03-17 DIAGNOSIS — Z471 Aftercare following joint replacement surgery: Secondary | ICD-10-CM | POA: Diagnosis not present

## 2016-03-17 DIAGNOSIS — M1711 Unilateral primary osteoarthritis, right knee: Secondary | ICD-10-CM | POA: Diagnosis not present

## 2016-03-17 DIAGNOSIS — Z96652 Presence of left artificial knee joint: Secondary | ICD-10-CM | POA: Diagnosis not present

## 2016-03-30 DIAGNOSIS — M25562 Pain in left knee: Secondary | ICD-10-CM | POA: Diagnosis not present

## 2016-03-30 DIAGNOSIS — G8929 Other chronic pain: Secondary | ICD-10-CM | POA: Diagnosis not present

## 2016-03-30 DIAGNOSIS — M25662 Stiffness of left knee, not elsewhere classified: Secondary | ICD-10-CM | POA: Diagnosis not present

## 2016-03-30 DIAGNOSIS — M62452 Contracture of muscle, left thigh: Secondary | ICD-10-CM | POA: Diagnosis not present

## 2016-04-01 DIAGNOSIS — M25662 Stiffness of left knee, not elsewhere classified: Secondary | ICD-10-CM | POA: Diagnosis not present

## 2016-04-01 DIAGNOSIS — M25562 Pain in left knee: Secondary | ICD-10-CM | POA: Diagnosis not present

## 2016-04-01 DIAGNOSIS — M62452 Contracture of muscle, left thigh: Secondary | ICD-10-CM | POA: Diagnosis not present

## 2016-04-01 DIAGNOSIS — G8929 Other chronic pain: Secondary | ICD-10-CM | POA: Diagnosis not present

## 2016-04-05 DIAGNOSIS — M62452 Contracture of muscle, left thigh: Secondary | ICD-10-CM | POA: Diagnosis not present

## 2016-04-05 DIAGNOSIS — M25562 Pain in left knee: Secondary | ICD-10-CM | POA: Diagnosis not present

## 2016-04-05 DIAGNOSIS — G8929 Other chronic pain: Secondary | ICD-10-CM | POA: Diagnosis not present

## 2016-04-05 DIAGNOSIS — M25662 Stiffness of left knee, not elsewhere classified: Secondary | ICD-10-CM | POA: Diagnosis not present

## 2016-04-07 DIAGNOSIS — M25562 Pain in left knee: Secondary | ICD-10-CM | POA: Diagnosis not present

## 2016-04-07 DIAGNOSIS — G8929 Other chronic pain: Secondary | ICD-10-CM | POA: Diagnosis not present

## 2016-04-07 DIAGNOSIS — M62452 Contracture of muscle, left thigh: Secondary | ICD-10-CM | POA: Diagnosis not present

## 2016-04-07 DIAGNOSIS — M25662 Stiffness of left knee, not elsewhere classified: Secondary | ICD-10-CM | POA: Diagnosis not present

## 2016-04-12 DIAGNOSIS — M25662 Stiffness of left knee, not elsewhere classified: Secondary | ICD-10-CM | POA: Diagnosis not present

## 2016-04-12 DIAGNOSIS — G8929 Other chronic pain: Secondary | ICD-10-CM | POA: Diagnosis not present

## 2016-04-12 DIAGNOSIS — M25562 Pain in left knee: Secondary | ICD-10-CM | POA: Diagnosis not present

## 2016-04-12 DIAGNOSIS — M62452 Contracture of muscle, left thigh: Secondary | ICD-10-CM | POA: Diagnosis not present

## 2016-04-16 DIAGNOSIS — M25662 Stiffness of left knee, not elsewhere classified: Secondary | ICD-10-CM | POA: Diagnosis not present

## 2016-04-16 DIAGNOSIS — M62452 Contracture of muscle, left thigh: Secondary | ICD-10-CM | POA: Diagnosis not present

## 2016-04-16 DIAGNOSIS — G8929 Other chronic pain: Secondary | ICD-10-CM | POA: Diagnosis not present

## 2016-04-16 DIAGNOSIS — M25562 Pain in left knee: Secondary | ICD-10-CM | POA: Diagnosis not present

## 2016-04-28 DIAGNOSIS — M1711 Unilateral primary osteoarthritis, right knee: Secondary | ICD-10-CM | POA: Diagnosis not present

## 2016-05-05 DIAGNOSIS — Z0000 Encounter for general adult medical examination without abnormal findings: Secondary | ICD-10-CM | POA: Diagnosis not present

## 2016-05-05 DIAGNOSIS — M1711 Unilateral primary osteoarthritis, right knee: Secondary | ICD-10-CM | POA: Diagnosis not present

## 2016-05-11 DIAGNOSIS — Z1389 Encounter for screening for other disorder: Secondary | ICD-10-CM | POA: Diagnosis not present

## 2016-05-11 DIAGNOSIS — Z0000 Encounter for general adult medical examination without abnormal findings: Secondary | ICD-10-CM | POA: Diagnosis not present

## 2016-05-12 DIAGNOSIS — M1711 Unilateral primary osteoarthritis, right knee: Secondary | ICD-10-CM | POA: Diagnosis not present

## 2016-05-17 DIAGNOSIS — Z1231 Encounter for screening mammogram for malignant neoplasm of breast: Secondary | ICD-10-CM | POA: Diagnosis not present

## 2016-05-17 DIAGNOSIS — R932 Abnormal findings on diagnostic imaging of liver and biliary tract: Secondary | ICD-10-CM | POA: Diagnosis not present

## 2016-05-17 DIAGNOSIS — N281 Cyst of kidney, acquired: Secondary | ICD-10-CM | POA: Diagnosis not present

## 2016-05-17 DIAGNOSIS — R748 Abnormal levels of other serum enzymes: Secondary | ICD-10-CM | POA: Diagnosis not present

## 2016-11-14 ENCOUNTER — Other Ambulatory Visit: Payer: Self-pay | Admitting: Gastroenterology

## 2016-11-23 ENCOUNTER — Telehealth: Payer: Self-pay | Admitting: Gastroenterology

## 2016-11-23 MED ORDER — DEXLANSOPRAZOLE 60 MG PO CPDR: 60.0000 mg | DELAYED_RELEASE_CAPSULE | Freq: Every day | ORAL | 3 refills | Status: DC

## 2016-11-23 NOTE — Telephone Encounter (Signed)
Forwarding to refill box.  

## 2016-11-23 NOTE — Telephone Encounter (Signed)
Pt needs a refill of Dexilant called into [facility] in [place]

## 2016-11-23 NOTE — Addendum Note (Signed)
Addended by: Annitta Needs on: 11/23/2016 02:21 PM   Modules accepted: Orders

## 2017-01-26 ENCOUNTER — Ambulatory Visit (INDEPENDENT_AMBULATORY_CARE_PROVIDER_SITE_OTHER): Payer: Medicare Other | Admitting: Gastroenterology

## 2017-01-26 ENCOUNTER — Encounter: Payer: Self-pay | Admitting: Gastroenterology

## 2017-01-26 VITALS — BP 137/87 | HR 86 | Temp 97.6°F | Ht 63.0 in | Wt 242.6 lb

## 2017-01-26 DIAGNOSIS — R6881 Early satiety: Secondary | ICD-10-CM | POA: Diagnosis not present

## 2017-01-26 DIAGNOSIS — K219 Gastro-esophageal reflux disease without esophagitis: Secondary | ICD-10-CM | POA: Diagnosis not present

## 2017-01-26 DIAGNOSIS — K581 Irritable bowel syndrome with constipation: Secondary | ICD-10-CM

## 2017-01-26 MED ORDER — LUBIPROSTONE 8 MCG PO CAPS: 8.0000 ug | ORAL_CAPSULE | Freq: Two times a day (BID) | ORAL | 5 refills | Status: DC

## 2017-01-26 NOTE — Patient Instructions (Signed)
Start Amitiza 8 mcg twice daily with food for IBS with constipation.  Samples provided.  Prescription sent to your pharmacy.  Return to the office in 4 weeks or call sooner if symptoms worsen.

## 2017-01-26 NOTE — Progress Notes (Signed)
CC'ED TO PCP 

## 2017-01-26 NOTE — Assessment & Plan Note (Addendum)
Typical reflux symptoms well controlled.  Continue Dexilant.  Limit naproxen or other anti-inflammatories for now.  Recommend weight loss, weight is up 50 pounds in 3 years.  She complains of early satiety and bloating in the setting of chronic uncontrolled constipation.  Management of constipation, will see what her symptoms settle out.  Return to the office in 4 weeks.  Call if worsening of symptoms.

## 2017-01-26 NOTE — Progress Notes (Signed)
Primary Care Physician: Caryl Bis, MD  Primary Gastroenterologist:  Barney Drain, MD   Chief Complaint  Patient presents with  . Abdominal Pain    x 2-3 weeks  . Nausea  . Constipation    HPI: Holly [name] is a 61 y.o. female here for follow-up.  Last seen at time of colonoscopy January 2017.  She had diverticulosis and single tubular adenoma removed.  Currently she is constipated.  Can go over a week without a bowel movement.  Takes Dulcolax 2 at a time, gripes her stomach real bad.  Previously on Linzess 145 mcg.  She only takes 1-2/week because "it works like a laxative".  "I cannot take it and go to work".  She did try Dr. filled suggestion of opening the capsule up and putting in 4 tablespoons of water, consuming only 2-3 tablespoons of liquid.  She states she continued to have watery stools.  Takes Dexilant with good control of her heartburn.  She complains of hiccuping belching with meals.  No dysphagia.  Complains of early satiety for "a while".  Notably patient has gained 22 pounds since we saw her in 2017.  Her weight is up over 50 pounds in the past 3 years. Pp periumbilical pain and bloating. Naproxen, hasn't been on in awhile.  No melena or bright red blood per rectum.  She had labs with her physical about 6 months ago when she reports they look good.     Current Outpatient Prescriptions  Medication Sig Dispense Refill  . dexlansoprazole (DEXILANT) 60 MG capsule Take 1 capsule (60 mg total) by mouth daily. 90 capsule 3  . diphenhydrAMINE (BENADRYL) 25 mg capsule Take 25 mg by mouth at bedtime as needed for allergies or sleep. For allergies.    . fexofenadine (ALLEGRA) 180 MG tablet Take 180 mg by mouth daily as needed for allergies.     . naproxen sodium (ANAPROX) 220 MG tablet Take 440 mg by mouth daily as needed (pain).    . Tetrahydrozoline HCl (VISINE OP) Place 1 drop into both eyes daily as needed (dry eyes).      No current facility-administered  medications for this visit.     Allergies as of 01/26/2017 - Review Complete 01/26/2017  Allergen Reaction Noted  . Codeine Nausea And Vomiting 01/20/2012  . Morphine and related Nausea And Vomiting 01/20/2012    ROS:  General: Negative for anorexia, weight loss, fever, chills, fatigue, weakness. ENT: Negative for hoarseness, difficulty swallowing , nasal congestion. CV: Negative for chest pain, angina, palpitations, dyspnea on exertion, peripheral edema.  Respiratory: Negative for dyspnea at rest, dyspnea on exertion, cough, sputum, wheezing.  GI: See history of present illness. GU:  Negative for dysuria, hematuria, urinary incontinence, urinary frequency, nocturnal urination.  Endo: Negative for unusual weight change.    Physical Examination:   BP 137/87   Pulse 86   Temp 97.6 F (36.4 C) (Oral)   Ht 5\' 3"  (1.6 m)   Wt 242 lb 9.6 oz (110 kg)   BMI 42.97 kg/m   General: Well-nourished, well-developed in no acute distress.  Eyes: No icterus. Mouth: Oropharyngeal mucosa moist and pink , no lesions erythema or exudate. Lungs: Clear to auscultation bilaterally.  Heart: Regular rate and rhythm, no murmurs rubs or gallops.  Abdomen: Bowel sounds are normal, nontender, nondistended, no hepatosplenomegaly or masses, no abdominal bruits or hernia , no rebound or guarding.   Extremities: No lower extremity edema. No clubbing or deformities.  Neuro: Alert and oriented x 4   Skin: Warm and dry, no jaundice.   Psych: Alert and cooperative, normal mood and affect.

## 2017-01-26 NOTE — Assessment & Plan Note (Signed)
Constipation predominant, poorly controlled.  Try amities a 8 mcg twice daily with food.  Samples and prescription provided.  Return to the office in 4 weeks.

## 2017-01-27 DIAGNOSIS — J4531 Mild persistent asthma with (acute) exacerbation: Secondary | ICD-10-CM | POA: Diagnosis not present

## 2017-01-27 DIAGNOSIS — G2581 Restless legs syndrome: Secondary | ICD-10-CM | POA: Diagnosis not present

## 2017-01-27 DIAGNOSIS — Z23 Encounter for immunization: Secondary | ICD-10-CM | POA: Diagnosis not present

## 2017-03-10 ENCOUNTER — Encounter: Payer: Self-pay | Admitting: Gastroenterology

## 2017-03-10 ENCOUNTER — Ambulatory Visit (INDEPENDENT_AMBULATORY_CARE_PROVIDER_SITE_OTHER): Payer: Medicare Other | Admitting: Gastroenterology

## 2017-03-10 DIAGNOSIS — K5900 Constipation, unspecified: Secondary | ICD-10-CM

## 2017-03-10 NOTE — Progress Notes (Signed)
Primary Care Physician: Caryl Bis, MD  Primary Gastroenterologist:  Garfield Cornea, MD   Chief Complaint  Patient presents with  . Abdominal Pain    across the upper part into ribs  . Constipation    BM once a week    HPI: Holly [name] is a 61 y.o. female here for week follow-up.  She was seen back in November with constipation issues.  Going over a week without a BM.  Linzess 145 mcg daily would calls watery stool and she could take up to twice per week.  She may try taking half the dose still with the same results.  Complained of bloating and periumbilical abdominal pain at last visit.  I put her on Amitiza 8 mcg twice daily.  She states she still has a BM about once per week.  Continues to have bloating.  Pain on both flanks and into the upper abdomen which she feels is related to constipation.  Intermittent left lower quadrant pain but nothing persistent.  No melena or rectal bleeding.  No upper GI symptoms.  Current Outpatient Medications  Medication Sig Dispense Refill  . atorvastatin (LIPITOR) 10 MG tablet Take 1 tablet by mouth daily.    Marland Kitchen dexlansoprazole (DEXILANT) 60 MG capsule Take 1 capsule (60 mg total) by mouth daily. 90 capsule 3  . diphenhydrAMINE (BENADRYL) 25 mg capsule Take 25 mg by mouth at bedtime as needed for allergies or sleep. For allergies.    Marland Kitchen lubiprostone (AMITIZA) 8 MCG capsule Take 1 capsule (8 mcg total) by mouth 2 (two) times daily with a meal. 60 capsule 5  . montelukast (SINGULAIR) 10 MG tablet Take 10 mg by mouth at bedtime.    . naproxen sodium (ANAPROX) 220 MG tablet Take 440 mg by mouth daily as needed (pain).    . Tetrahydrozoline HCl (VISINE OP) Place 1 drop into both eyes daily as needed (dry eyes).      No current facility-administered medications for this visit.     Allergies as of 03/10/2017 - Review Complete 03/10/2017  Allergen Reaction Noted  . Codeine Nausea And Vomiting 01/20/2012  . Morphine and related Nausea And  Vomiting 01/20/2012    ROS:  General: Negative for anorexia, weight loss, fever, chills, fatigue, weakness. ENT: Negative for hoarseness, difficulty swallowing , nasal congestion. CV: Negative for chest pain, angina, palpitations, dyspnea on exertion, peripheral edema.  Respiratory: Negative for dyspnea at rest, dyspnea on exertion, cough, sputum, wheezing.  GI: See history of present illness. GU:  Negative for dysuria, hematuria, urinary incontinence, urinary frequency, nocturnal urination.  Endo: Negative for unusual weight change.    Physical Examination:   BP (!) 143/89   Pulse 89   Temp (!) 97 F (36.1 C) (Oral)   Ht 5\' 4"  (1.626 m)   Wt 240 lb 12.8 oz (109.2 kg)   BMI 41.33 kg/m   General: Well-nourished, well-developed in no acute distress.  Eyes: No icterus. Mouth: Oropharyngeal mucosa moist and pink , no lesions erythema or exudate. Lungs: Clear to auscultation bilaterally.  Heart: Regular rate and rhythm, no murmurs rubs or gallops.  Abdomen: Bowel sounds are normal, minimal left lower quadrant tenderness, nondistended, no hepatosplenomegaly or masses, no abdominal bruits or hernia , no rebound or guarding.   Extremities: No lower extremity edema. No clubbing or deformities. Neuro: Alert and oriented x 4   Skin: Warm and dry, no jaundice.   Psych: Alert and cooperative, normal mood and affect.  Imaging Studies: No results found.

## 2017-03-10 NOTE — Assessment & Plan Note (Signed)
She just recently filled her Amitiza 8 mcg prescription.  Provided her with samples at 24 mcg today to take 1 in the morning, she will take 8 mcg in the evening with food.  She is to call me in 2 weeks or sooner if needed.  If her bowel function improves and continues to have abdominal pain, would consider CT imaging of the abdomen and pelvis.  If she continues to have constipation she should let me know when we will increase her dose to 24 mcg twice daily prior to changing to trulance.  Patient voiced understanding.

## 2017-03-10 NOTE — Patient Instructions (Signed)
1. Take Amitiza 27mcg with breakfast and 15mcg with evening meal.  2. Call in two weeks and let me know how you are doing with regards to your abdominal pain and constipation. If you continue to have abdominal pain even after your bowel movements are improved, we would consider CT scan of your belly.

## 2017-03-13 NOTE — Progress Notes (Signed)
cc'ed to pcp °

## 2017-04-25 DIAGNOSIS — G2581 Restless legs syndrome: Secondary | ICD-10-CM | POA: Diagnosis not present

## 2017-04-25 DIAGNOSIS — M25561 Pain in right knee: Secondary | ICD-10-CM | POA: Diagnosis not present

## 2017-04-25 DIAGNOSIS — J019 Acute sinusitis, unspecified: Secondary | ICD-10-CM | POA: Diagnosis not present

## 2017-05-12 DIAGNOSIS — D519 Vitamin B12 deficiency anemia, unspecified: Secondary | ICD-10-CM | POA: Diagnosis not present

## 2017-05-12 DIAGNOSIS — R739 Hyperglycemia, unspecified: Secondary | ICD-10-CM | POA: Diagnosis not present

## 2017-05-12 DIAGNOSIS — K581 Irritable bowel syndrome with constipation: Secondary | ICD-10-CM | POA: Diagnosis not present

## 2017-05-12 DIAGNOSIS — E782 Mixed hyperlipidemia: Secondary | ICD-10-CM | POA: Diagnosis not present

## 2017-05-12 DIAGNOSIS — R945 Abnormal results of liver function studies: Secondary | ICD-10-CM | POA: Diagnosis not present

## 2017-05-15 DIAGNOSIS — M1711 Unilateral primary osteoarthritis, right knee: Secondary | ICD-10-CM | POA: Diagnosis not present

## 2017-05-15 DIAGNOSIS — M7061 Trochanteric bursitis, right hip: Secondary | ICD-10-CM | POA: Diagnosis not present

## 2017-05-18 DIAGNOSIS — J4530 Mild persistent asthma, uncomplicated: Secondary | ICD-10-CM | POA: Diagnosis not present

## 2017-05-18 DIAGNOSIS — R739 Hyperglycemia, unspecified: Secondary | ICD-10-CM | POA: Diagnosis not present

## 2017-05-18 DIAGNOSIS — Z0000 Encounter for general adult medical examination without abnormal findings: Secondary | ICD-10-CM | POA: Diagnosis not present

## 2017-05-18 DIAGNOSIS — R5383 Other fatigue: Secondary | ICD-10-CM | POA: Diagnosis not present

## 2017-05-18 DIAGNOSIS — E782 Mixed hyperlipidemia: Secondary | ICD-10-CM | POA: Diagnosis not present

## 2017-05-18 DIAGNOSIS — G2581 Restless legs syndrome: Secondary | ICD-10-CM | POA: Diagnosis not present

## 2017-05-24 DIAGNOSIS — L918 Other hypertrophic disorders of the skin: Secondary | ICD-10-CM | POA: Diagnosis not present

## 2017-06-05 DIAGNOSIS — M1711 Unilateral primary osteoarthritis, right knee: Secondary | ICD-10-CM | POA: Diagnosis not present

## 2017-06-12 DIAGNOSIS — Z1231 Encounter for screening mammogram for malignant neoplasm of breast: Secondary | ICD-10-CM | POA: Diagnosis not present

## 2017-06-12 DIAGNOSIS — M1711 Unilateral primary osteoarthritis, right knee: Secondary | ICD-10-CM | POA: Diagnosis not present

## 2017-06-12 DIAGNOSIS — R928 Other abnormal and inconclusive findings on diagnostic imaging of breast: Secondary | ICD-10-CM | POA: Diagnosis not present

## 2017-06-19 DIAGNOSIS — M1711 Unilateral primary osteoarthritis, right knee: Secondary | ICD-10-CM | POA: Diagnosis not present

## 2017-06-19 DIAGNOSIS — M25561 Pain in right knee: Secondary | ICD-10-CM | POA: Diagnosis not present

## 2017-06-21 DIAGNOSIS — N6321 Unspecified lump in the left breast, upper outer quadrant: Secondary | ICD-10-CM | POA: Diagnosis not present

## 2017-06-21 DIAGNOSIS — N6002 Solitary cyst of left breast: Secondary | ICD-10-CM | POA: Diagnosis not present

## 2017-06-21 DIAGNOSIS — N6001 Solitary cyst of right breast: Secondary | ICD-10-CM | POA: Diagnosis not present

## 2017-06-21 DIAGNOSIS — R928 Other abnormal and inconclusive findings on diagnostic imaging of breast: Secondary | ICD-10-CM | POA: Diagnosis not present

## 2017-09-18 DIAGNOSIS — J019 Acute sinusitis, unspecified: Secondary | ICD-10-CM | POA: Diagnosis not present

## 2017-09-18 DIAGNOSIS — J4531 Mild persistent asthma with (acute) exacerbation: Secondary | ICD-10-CM | POA: Diagnosis not present

## 2017-11-03 DIAGNOSIS — L089 Local infection of the skin and subcutaneous tissue, unspecified: Secondary | ICD-10-CM | POA: Diagnosis not present

## 2017-11-25 ENCOUNTER — Other Ambulatory Visit: Payer: Self-pay | Admitting: Gastroenterology

## 2018-01-16 DIAGNOSIS — Z23 Encounter for immunization: Secondary | ICD-10-CM | POA: Diagnosis not present

## 2018-01-16 DIAGNOSIS — R0982 Postnasal drip: Secondary | ICD-10-CM | POA: Diagnosis not present

## 2018-01-16 DIAGNOSIS — J301 Allergic rhinitis due to pollen: Secondary | ICD-10-CM | POA: Diagnosis not present

## 2018-02-14 DIAGNOSIS — J209 Acute bronchitis, unspecified: Secondary | ICD-10-CM | POA: Diagnosis not present

## 2018-02-14 DIAGNOSIS — J019 Acute sinusitis, unspecified: Secondary | ICD-10-CM | POA: Diagnosis not present

## 2018-05-11 DIAGNOSIS — H1031 Unspecified acute conjunctivitis, right eye: Secondary | ICD-10-CM | POA: Diagnosis not present

## 2018-06-13 DIAGNOSIS — M25561 Pain in right knee: Secondary | ICD-10-CM | POA: Diagnosis not present

## 2018-06-13 DIAGNOSIS — M1711 Unilateral primary osteoarthritis, right knee: Secondary | ICD-10-CM | POA: Diagnosis not present

## 2018-06-18 DIAGNOSIS — J4531 Mild persistent asthma with (acute) exacerbation: Secondary | ICD-10-CM | POA: Diagnosis not present

## 2018-06-21 DIAGNOSIS — M1711 Unilateral primary osteoarthritis, right knee: Secondary | ICD-10-CM | POA: Diagnosis not present

## 2018-06-21 DIAGNOSIS — R262 Difficulty in walking, not elsewhere classified: Secondary | ICD-10-CM | POA: Diagnosis not present

## 2018-06-21 DIAGNOSIS — M25561 Pain in right knee: Secondary | ICD-10-CM | POA: Diagnosis not present

## 2018-07-05 DIAGNOSIS — M25561 Pain in right knee: Secondary | ICD-10-CM | POA: Diagnosis not present

## 2018-07-05 DIAGNOSIS — M1711 Unilateral primary osteoarthritis, right knee: Secondary | ICD-10-CM | POA: Diagnosis not present

## 2018-08-14 DIAGNOSIS — R05 Cough: Secondary | ICD-10-CM | POA: Diagnosis not present

## 2018-08-22 DIAGNOSIS — U071 COVID-19: Secondary | ICD-10-CM | POA: Diagnosis not present

## 2018-08-22 DIAGNOSIS — L299 Pruritus, unspecified: Secondary | ICD-10-CM | POA: Diagnosis not present

## 2018-12-05 DIAGNOSIS — L218 Other seborrheic dermatitis: Secondary | ICD-10-CM | POA: Diagnosis not present

## 2018-12-05 DIAGNOSIS — L308 Other specified dermatitis: Secondary | ICD-10-CM | POA: Diagnosis not present

## 2018-12-05 DIAGNOSIS — L304 Erythema intertrigo: Secondary | ICD-10-CM | POA: Diagnosis not present

## 2019-01-10 DIAGNOSIS — Z23 Encounter for immunization: Secondary | ICD-10-CM | POA: Diagnosis not present

## 2019-01-10 DIAGNOSIS — N750 Cyst of Bartholin's gland: Secondary | ICD-10-CM | POA: Diagnosis not present

## 2019-01-25 DIAGNOSIS — E782 Mixed hyperlipidemia: Secondary | ICD-10-CM | POA: Diagnosis not present

## 2019-01-25 DIAGNOSIS — J4531 Mild persistent asthma with (acute) exacerbation: Secondary | ICD-10-CM | POA: Diagnosis not present

## 2019-02-25 DIAGNOSIS — R739 Hyperglycemia, unspecified: Secondary | ICD-10-CM | POA: Diagnosis not present

## 2019-09-19 DIAGNOSIS — H25813 Combined forms of age-related cataract, bilateral: Secondary | ICD-10-CM | POA: Diagnosis not present

## 2019-10-21 DIAGNOSIS — H25041 Posterior subcapsular polar age-related cataract, right eye: Secondary | ICD-10-CM | POA: Diagnosis not present

## 2019-10-21 DIAGNOSIS — H18413 Arcus senilis, bilateral: Secondary | ICD-10-CM | POA: Diagnosis not present

## 2019-10-21 DIAGNOSIS — H25042 Posterior subcapsular polar age-related cataract, left eye: Secondary | ICD-10-CM | POA: Diagnosis not present

## 2019-11-11 DIAGNOSIS — H2512 Age-related nuclear cataract, left eye: Secondary | ICD-10-CM | POA: Diagnosis not present

## 2019-11-11 DIAGNOSIS — H18413 Arcus senilis, bilateral: Secondary | ICD-10-CM | POA: Diagnosis not present

## 2019-11-11 DIAGNOSIS — R0602 Shortness of breath: Secondary | ICD-10-CM | POA: Diagnosis not present

## 2019-11-11 DIAGNOSIS — H25812 Combined forms of age-related cataract, left eye: Secondary | ICD-10-CM | POA: Diagnosis not present

## 2019-11-11 DIAGNOSIS — H25043 Posterior subcapsular polar age-related cataract, bilateral: Secondary | ICD-10-CM | POA: Diagnosis not present

## 2019-11-11 DIAGNOSIS — H25042 Posterior subcapsular polar age-related cataract, left eye: Secondary | ICD-10-CM | POA: Diagnosis not present

## 2019-11-11 DIAGNOSIS — M1990 Unspecified osteoarthritis, unspecified site: Secondary | ICD-10-CM | POA: Diagnosis not present

## 2019-11-11 DIAGNOSIS — H25041 Posterior subcapsular polar age-related cataract, right eye: Secondary | ICD-10-CM | POA: Diagnosis not present

## 2019-11-11 DIAGNOSIS — Z961 Presence of intraocular lens: Secondary | ICD-10-CM | POA: Diagnosis not present

## 2019-11-25 DIAGNOSIS — M1990 Unspecified osteoarthritis, unspecified site: Secondary | ICD-10-CM | POA: Diagnosis not present

## 2019-11-25 DIAGNOSIS — Z87891 Personal history of nicotine dependence: Secondary | ICD-10-CM | POA: Diagnosis not present

## 2019-11-25 DIAGNOSIS — H2511 Age-related nuclear cataract, right eye: Secondary | ICD-10-CM | POA: Diagnosis not present

## 2019-11-25 DIAGNOSIS — H25041 Posterior subcapsular polar age-related cataract, right eye: Secondary | ICD-10-CM | POA: Diagnosis not present

## 2019-11-25 DIAGNOSIS — H18413 Arcus senilis, bilateral: Secondary | ICD-10-CM | POA: Diagnosis not present

## 2020-01-03 DIAGNOSIS — Z23 Encounter for immunization: Secondary | ICD-10-CM | POA: Diagnosis not present

## 2020-03-16 ENCOUNTER — Encounter: Payer: Self-pay | Admitting: Internal Medicine

## 2020-06-11 DIAGNOSIS — R61 Generalized hyperhidrosis: Secondary | ICD-10-CM | POA: Diagnosis not present

## 2020-06-11 DIAGNOSIS — L299 Pruritus, unspecified: Secondary | ICD-10-CM | POA: Diagnosis not present

## 2020-06-11 DIAGNOSIS — K5792 Diverticulitis of intestine, part unspecified, without perforation or abscess without bleeding: Secondary | ICD-10-CM | POA: Diagnosis not present

## 2020-06-11 DIAGNOSIS — R109 Unspecified abdominal pain: Secondary | ICD-10-CM | POA: Diagnosis not present

## 2020-06-13 ENCOUNTER — Emergency Department (HOSPITAL_COMMUNITY): Payer: Medicare Other

## 2020-06-13 ENCOUNTER — Emergency Department (HOSPITAL_COMMUNITY)
Admission: EM | Admit: 2020-06-13 | Discharge: 2020-06-13 | Disposition: A | Discharge status: Home / Self Care | Payer: Medicare Other | Attending: Emergency Medicine | Admitting: Emergency Medicine

## 2020-06-13 ENCOUNTER — Encounter (HOSPITAL_COMMUNITY): Payer: Self-pay | Admitting: Emergency Medicine

## 2020-06-13 ENCOUNTER — Other Ambulatory Visit: Payer: Self-pay

## 2020-06-13 DIAGNOSIS — Z96652 Presence of left artificial knee joint: Secondary | ICD-10-CM | POA: Insufficient documentation

## 2020-06-13 DIAGNOSIS — K5732 Diverticulitis of large intestine without perforation or abscess without bleeding: Secondary | ICD-10-CM | POA: Diagnosis not present

## 2020-06-13 DIAGNOSIS — Z87891 Personal history of nicotine dependence: Secondary | ICD-10-CM | POA: Insufficient documentation

## 2020-06-13 DIAGNOSIS — N281 Cyst of kidney, acquired: Secondary | ICD-10-CM | POA: Diagnosis not present

## 2020-06-13 DIAGNOSIS — K5792 Diverticulitis of intestine, part unspecified, without perforation or abscess without bleeding: Secondary | ICD-10-CM | POA: Insufficient documentation

## 2020-06-13 DIAGNOSIS — R300 Dysuria: Secondary | ICD-10-CM | POA: Diagnosis not present

## 2020-06-13 DIAGNOSIS — K219 Gastro-esophageal reflux disease without esophagitis: Secondary | ICD-10-CM | POA: Diagnosis not present

## 2020-06-13 DIAGNOSIS — R110 Nausea: Secondary | ICD-10-CM | POA: Diagnosis not present

## 2020-06-13 DIAGNOSIS — R1032 Left lower quadrant pain: Secondary | ICD-10-CM | POA: Diagnosis present

## 2020-06-13 LAB — CBC WITH DIFFERENTIAL/PLATELET
Abs Immature Granulocytes: 0.02 10*3/uL (ref 0.00–0.07)
Basophils Absolute: 0 10*3/uL (ref 0.0–0.1)
Basophils Relative: 0 %
Eosinophils Absolute: 0.1 10*3/uL (ref 0.0–0.5)
Eosinophils Relative: 1 %
HCT: 45.2 % (ref 36.0–46.0)
Hemoglobin: 14.6 g/dL (ref 12.0–15.0)
Immature Granulocytes: 0 %
Lymphocytes Relative: 25 %
Lymphs Abs: 1.8 10*3/uL (ref 0.7–4.0)
MCH: 29.7 pg (ref 26.0–34.0)
MCHC: 32.3 g/dL (ref 30.0–36.0)
MCV: 92.1 fL (ref 80.0–100.0)
Monocytes Absolute: 1 10*3/uL (ref 0.1–1.0)
Monocytes Relative: 14 %
Neutro Abs: 4.2 10*3/uL (ref 1.7–7.7)
Neutrophils Relative %: 60 %
Platelets: 327 10*3/uL (ref 150–400)
RBC: 4.91 MIL/uL (ref 3.87–5.11)
RDW: 13.6 % (ref 11.5–15.5)
WBC: 7 10*3/uL (ref 4.0–10.5)
nRBC: 0 % (ref 0.0–0.2)

## 2020-06-13 LAB — URINALYSIS, ROUTINE W REFLEX MICROSCOPIC
Bilirubin Urine: NEGATIVE
Glucose, UA: NEGATIVE mg/dL
Hgb urine dipstick: NEGATIVE
Ketones, ur: NEGATIVE mg/dL
Leukocytes,Ua: NEGATIVE
Nitrite: NEGATIVE
Protein, ur: NEGATIVE mg/dL
Specific Gravity, Urine: >1.046 — ABNORMAL HIGH (ref 1.005–1.030)
pH: 6 (ref 5.0–8.0)

## 2020-06-13 LAB — LIPASE, BLOOD: Lipase: 22 U/L (ref 11–51)

## 2020-06-13 LAB — COMPREHENSIVE METABOLIC PANEL
ALT: 24 U/L (ref 0–44)
AST: 24 U/L (ref 15–41)
Albumin: 3.5 g/dL (ref 3.5–5.0)
Alkaline Phosphatase: 94 U/L (ref 38–126)
Anion gap: 8 (ref 5–15)
BUN: 13 mg/dL (ref 8–23)
CO2: 24 mmol/L (ref 22–32)
Calcium: 8.7 mg/dL — ABNORMAL LOW (ref 8.9–10.3)
Chloride: 106 mmol/L (ref 98–111)
Creatinine, Ser: 0.7 mg/dL (ref 0.44–1.00)
GFR, Estimated: >60 mL/min (ref >60)
Glucose, Bld: 152 mg/dL — ABNORMAL HIGH (ref 70–99)
Potassium: 3.6 mmol/L (ref 3.5–5.1)
Sodium: 138 mmol/L (ref 135–145)
Total Bilirubin: 0.7 mg/dL (ref 0.3–1.2)
Total Protein: 7.3 g/dL (ref 6.5–8.1)

## 2020-06-13 MED ORDER — ONDANSETRON HCL 4 MG/2ML IJ SOLN
4.0000 mg | Freq: Once | INTRAMUSCULAR | Status: AC
  Administered 2020-06-13: 4 mg via INTRAVENOUS
  Filled 2020-06-13: qty 2

## 2020-06-13 MED ORDER — SODIUM CHLORIDE 0.9 % IV BOLUS
500.0000 mL | Freq: Once | INTRAVENOUS | Status: AC
  Administered 2020-06-13: 500 mL via INTRAVENOUS

## 2020-06-13 MED ORDER — FENTANYL CITRATE (PF) 100 MCG/2ML IJ SOLN
100.0000 ug | Freq: Once | INTRAMUSCULAR | Status: AC
  Administered 2020-06-13: 100 ug via INTRAVENOUS
  Filled 2020-06-13: qty 2

## 2020-06-13 MED ORDER — IOHEXOL 300 MG/ML  SOLN
100.0000 mL | Freq: Once | INTRAMUSCULAR | Status: AC | PRN
  Administered 2020-06-13: 100 mL via INTRAVENOUS

## 2020-06-13 NOTE — ED Provider Notes (Signed)
[facility] EMERGENCY DEPARTMENT Provider Note   CSN: [id number] Arrival date & time: 06/13/20  1104     History Chief Complaint  Patient presents with  . Abdominal Pain    Holly [name] is a 65 y.o. female.  She is complaining of left lower quadrant pain that is been going on for 5 days.  Getting progressively worse.  Associated with some dysuria and nausea.  She also feels like she needs to have a BM but has only passed a small amount.  No rectal bleeding.  No vaginal bleeding or discharge.  He saw her PCP 2 days ago and was given Augmentin although she is not sure what they were treating.  History of diverticulitis and this feels similar.  The history is provided by the patient.  Abdominal Pain Pain location:  LLQ Pain quality: aching and stabbing   Pain radiates to:  Does not radiate Pain severity:  Severe Onset quality:  Gradual Timing:  Constant Progression:  Worsening Chronicity:  New Context: not trauma   Relieved by:  Nothing Worsened by:  Nothing Ineffective treatments: antibiotics. Associated symptoms: chills, constipation, dysuria and nausea   Associated symptoms: no chest pain, no cough, no diarrhea, no fever, no hematemesis, no hematochezia, no hematuria, no shortness of breath, no sore throat, no vaginal bleeding, no vaginal discharge and no vomiting        Past Medical History:  Diagnosis Date  . Arthritis    knees  . Constipation    chronic  . Diverticulitis 2006   SEP 2016 HF DIVERTICULITIS  . Diverticulosis   . Enteritis   . Fatty liver disease, nonalcoholic OCT 7829 U/S [place]   DEC 2016 NL [facility]  . Gallstone OCT 2016 [facility]  . GERD (gastroesophageal reflux disease)   . Hay fever   . Headache(784.0)   . Sinus problem     Patient Active Problem List   Diagnosis Date Noted  . Constipation 03/10/2017  . Early satiety 01/26/2017  . GERD (gastroesophageal reflux disease) 05/27/2014  . IBS (irritable bowel syndrome)   . Bilateral leg weakness  07/22/2013  . Hypokalemia 06/20/2013  . Acute diverticulitis 06/19/2013  . OA (osteoarthritis) of knee 01/30/2012    Past Surgical History:  Procedure Laterality Date  . ABDOMINAL HYSTERECTOMY  2003   partial  . CATARACT EXTRACTION Bilateral   . COLONOSCOPY  02/2011   Dr. Doristine Mango: PROPOFOL. scattered diverticula, sessile polyp descending colon, path unavailable. recommenced five year follow up colonoscopy  . COLONOSCOPY N/A 04/06/2015   Procedure: COLONOSCOPY;  Surgeon: Danie Binder, MD;  Location: [facility] SUITE;  Service: Endoscopy;  Laterality: N/A;  1115   . FOOT SURGERY     bilateral -bone removal  . KNEE CLOSED REDUCTION  04/02/2012   Procedure: CLOSED MANIPULATION KNEE;  Surgeon: Gearlean Alf, MD;  Location: [facility]S;  Service: Orthopedics;  Laterality: Left;  . TONSILLECTOMY     age 73  . TOTAL KNEE ARTHROPLASTY  01/30/2012   Procedure: TOTAL KNEE ARTHROPLASTY;  Surgeon: Gearlean Alf, MD;  Location: [facility];  Service: Orthopedics;  Laterality: Left;     OB History    Gravida  3   Para  3   Term  3   Preterm      AB      Living  3     SAB      IAB      Ectopic      Multiple  Live Births              Family History  Problem Relation Age of Onset  . Liver cancer Sister        deceased  . Colon cancer Neg Hx     Social History   Tobacco Use  . Smoking status: Former Smoker    Packs/day: 0.50    Years: 40.00    Pack years: 20.00    Types: Cigarettes    Quit date: 01/28/2012    Years since quitting: 8.3  . Smokeless tobacco: Never Used  Vaping Use  . Vaping Use: Never used  Substance Use Topics  . Alcohol use: No  . Drug use: No    Home Medications Prior to Admission medications   Medication Sig Start Date End Date Taking? Authorizing Provider  atorvastatin (LIPITOR) 10 MG tablet Take 1 tablet by mouth daily. 01/22/17   [provider]  DEXILANT 60 MG capsule TAKE 1 CAPSULE BY MOUTH ONCE DAILY 11/28/17   Carlis Stable, NP  diphenhydrAMINE (BENADRYL) 25 mg capsule Take 25 mg by mouth at bedtime as needed for allergies or sleep. For allergies.    [provider]  lubiprostone (AMITIZA) 8 MCG capsule Take 1 capsule (8 mcg total) by mouth 2 (two) times daily with a meal. 01/26/17   Mahala Menghini, PA-C  montelukast (SINGULAIR) 10 MG tablet Take 10 mg by mouth at bedtime.    [provider]  naproxen sodium (ANAPROX) 220 MG tablet Take 440 mg by mouth daily as needed (pain).    [provider]  Tetrahydrozoline HCl (VISINE OP) Place 1 drop into both eyes daily as needed (dry eyes).     [provider]    Allergies    Codeine and Morphine and related  Review of Systems   Review of Systems  Constitutional: Positive for chills. Negative for fever.  HENT: Negative for sore throat.   Eyes: Negative for visual disturbance.  Respiratory: Negative for cough and shortness of breath.   Cardiovascular: Negative for chest pain.  Gastrointestinal: Positive for abdominal pain, constipation and nausea. Negative for diarrhea, hematemesis, hematochezia and vomiting.  Genitourinary: Positive for dysuria. Negative for hematuria, vaginal bleeding and vaginal discharge.  Musculoskeletal: Negative for neck pain.  Skin: Negative for rash.  Neurological: Negative for headaches.    Physical Exam Updated Vital Signs BP (!) 135/50   Pulse 79   Temp 97.9 F (36.6 C) (Oral)   Resp 18   Ht 5\' 3"  (1.6 m)   Wt 106.6 kg   SpO2 96%   BMI 41.63 kg/m   Physical Exam Vitals and nursing note reviewed.  Constitutional:      General: She is not in acute distress.    Appearance: Normal appearance. She is well-developed. She is obese.  HENT:     Head: Normocephalic and atraumatic.  Eyes:     Conjunctiva/sclera: Conjunctivae normal.  Cardiovascular:     Rate and Rhythm: Normal rate and regular rhythm.     Heart sounds: No murmur heard.   Pulmonary:     Effort: Pulmonary effort is  normal. No respiratory distress.     Breath sounds: Normal breath sounds.  Abdominal:     Palpations: Abdomen is soft.     Tenderness: There is abdominal tenderness in the left lower quadrant. There is no guarding or rebound.  Musculoskeletal:        General: No deformity or signs of injury. Normal range of  motion.     Cervical back: Neck supple.  Skin:    General: Skin is warm and dry.     Capillary Refill: Capillary refill takes less than 2 seconds.  Neurological:     General: No focal deficit present.     Mental Status: She is alert.     ED Results / Procedures / Treatments   Labs (all labs ordered are listed, but only abnormal results are displayed) Labs Reviewed  COMPREHENSIVE METABOLIC PANEL - Abnormal; Notable for the following components:      Result Value   Glucose, Bld 152 (*)    Calcium 8.7 (*)    All other components within normal limits  URINALYSIS, ROUTINE W REFLEX MICROSCOPIC - Abnormal; Notable for the following components:   Specific Gravity, Urine >1.046 (*)    All other components within normal limits  CBC WITH DIFFERENTIAL/PLATELET  LIPASE, BLOOD    EKG None  Radiology CT Abdomen Pelvis W Contrast  Result Date: 06/13/2020 CLINICAL DATA:  Left lower abd pain x 6 days, nausea and dysuria. EXAM: CT ABDOMEN AND PELVIS WITH CONTRAST TECHNIQUE: Multidetector CT imaging of the abdomen and pelvis was performed using the standard protocol following bolus administration of intravenous contrast. CONTRAST:  142mL OMNIPAQUE IOHEXOL 300 MG/ML  SOLN COMPARISON:  CT abdomen pelvis 04/28/2016 FINDINGS: Lower chest: No acute abnormality. Hepatobiliary: No focal liver abnormality is seen. No gallstones, gallbladder wall thickening, or biliary dilatation. There is a moderate amount of biliary sludge. Pancreas: Unremarkable. No pancreatic ductal dilatation or surrounding inflammatory changes. Spleen: Normal in size without focal abnormality. Adrenals/Urinary Tract: Adrenal  glands are unremarkable. Bilateral renal cysts. No hydronephrosis or renal calculi. Urinary bladder is unremarkable. Stomach/Bowel: Stomach is within normal limits. Multiple sigmoid colonic diverticula with bowel wall thickening and surrounding inflammatory stranding. No pericolonic fluid collection or free air. No evidence of bowel obstruction. Normal appendix. Vascular/Lymphatic: Aortic atherosclerosis. No enlarged abdominal or pelvic lymph nodes. Reproductive: Status post hysterectomy. No adnexal masses. Other: No abdominal wall hernia or abnormality. No abdominopelvic ascites. Musculoskeletal: No acute or significant osseous findings. IMPRESSION: 1. Acute uncomplicated sigmoid diverticulitis. 2. Moderate amount of biliary sludge. 3. Aortic atherosclerosis. Aortic Atherosclerosis (ICD10-I70.0). Electronically Signed   By: Audie Pinto M.D.   On: 06/13/2020 13:30    Procedures Procedures   Medications Ordered in ED Medications  fentaNYL (SUBLIMAZE) injection 100 mcg (has no administration in time range)  ondansetron (ZOFRAN) injection 4 mg (has no administration in time range)  sodium chloride 0.9 % bolus 500 mL (has no administration in time range)    ED Course  I have reviewed the triage vital signs and the nursing notes.  Pertinent labs & imaging results that were available during my care of the patient were reviewed by me and considered in my medical decision making (see chart for details).  Clinical Course as of 06/13/20 1724  Sat Jun 13, 2020  1415 Patient CT showing uncomplicated diverticulitis.  She is only been on 2 days of antibiotics so not really sure this qualifies as a treatment failure.  Awaiting on urinalysis for decision regarding admission. [MB]    Clinical Course User Index [MB] Hayden Rasmussen, MD   MDM Rules/Calculators/A&P                         This patient complains of left lower quadrant abdominal pain; this involves an extensive number of  treatment Options and is a complaint that carries with it  a high risk of complications and Morbidity. The differential includes diverticulitis, colitis, UTI, renal colic, pyelonephritis  I ordered, reviewed and interpreted labs, which included CBC with normal white count normal hemoglobin, chemistries normal other than mildly elevated glucose, urinalysis without signs of infection I ordered medication IV fluids IV pain medicine and nausea medication I ordered imaging studies which included CT abdomen and pelvis with contrast and I independently    visualized and interpreted imaging which showed uncomplicated diverticulitis Previous records obtained and reviewed in epic including prior PCP visit  After the interventions stated above, I reevaluated the patient and found patient's pain to be improved.  She is only been on oral antibiotics for 2 days so I do not feel this is a treatment failure at this time.  Recommended continue with the Augmentin and close follow-up with PCP.  Patient comfortable with plan.  Return instructions discussed   Final Clinical Impression(s) / ED Diagnoses Final diagnoses:  Acute diverticulitis    Rx / DC Orders ED Discharge Orders    None       Hayden Rasmussen, MD 06/13/20 1726

## 2020-06-13 NOTE — ED Notes (Signed)
Patient transported to CT 

## 2020-06-13 NOTE — Discharge Instructions (Addendum)
You were seen in the emergency department for continued left lower quadrant abdominal pain.  You had blood work and a CAT scan along with urinalysis.  You have diverticulitis which is an infection in the lower part of the colon.  Please continue the Augmentin.  Drink plenty of fluids.  Clear liquid diet, advance as tolerated.  Follow-up with your doctor.  Return to the emergency department if any worsening or concerning symptoms.

## 2020-06-13 NOTE — ED Triage Notes (Signed)
Patient c/o left lower abd pain that started on Monday. Per patient nausea and dysuria. Denies any vomiting, fevers, or diarrhea. Patient states unable to have good BM even though it feels like she needs to go. Denies any blood in stool. Per patient hx of diverticulitis in which this feels similar. Patient states seen by PCP on Thursday and had urine checked with blood work. Patient told WBC and protein in urine but no results given for blood work. Patient was given Augmentin.

## 2020-06-18 NOTE — Progress Notes (Signed)
Primary Care Physician:  Caryl Bis, MD Primary Gastroenterologist:  Dr. Abbey Chatters  Chief Complaint  Patient presents with  . Abdominal Pain    Across lower abd and mid abd. Went to ED 06/13/20-diverticulitis, pain is not better. Frequent soft stools. Liquid/soft diet "everything goes straight threw me". Completed Augmentin    HPI:   Holly [name] is a 65 y.o. female with history of GERD, IBS with constipation, sigmoid diverticulitis in 2015 presenting today for lower abdominal pain.  We have not seen patient since 2018.  Last colonoscopy January 2017 with moderate diverticulosis throughout the entire examined colon, 2 colonic polyps, moderate internal hemorrhoids.  Pathology with 1 tubular adenoma and 1 hyperplastic polyp.  Recommended repeat colonoscopy in 5-10 years.   She was recently evaluated in the emergency room on 3/19 for left lower quadrant abdominal pain x5 days that had been progressively worsening with associated dysuria and nausea.  PCP had started her on Augmentin 2 days prior.  Laboratory evaluation reassuring.  CT with acute uncomplicated sigmoid diverticulitis, moderate amount of biliary sludge.  She was advised to continue her current antibiotics and have close follow-up with PCP.  Today:  Took Augmentin 1 tablet twice daily x 7 days. No improvement in her symptoms. Continues having a lot of lower abdominal pain, worse mid lower abdomen, but is all across. Pain is worse at night. Before last Monday, it would come for a day or 2 and resolve. Since last Monday it has been constant. No fever. Has been having sweating. Eating worsens the pain. Eating jello, peanut butter and jello, chicken noodle soup. Every thing she eats goes straight though her. Stools are loose but not watery. Total of 2-3 BMs per day.   Some nausea but no vomiting.   Today is the second day she has been off antibiotics.   Past Medical History:  Diagnosis Date  . Arthritis    knees  . Constipation     chronic  . Diverticulitis 2006   SEP 2016 HF DIVERTICULITIS  . Diverticulosis   . Enteritis   . Fatty liver disease, nonalcoholic OCT 5188 U/S [place]   DEC 2016 NL [facility]  . Gallstone OCT 2016 [facility]  . GERD (gastroesophageal reflux disease)   . Hay fever   . Headache(784.0)   . Sinus problem     Past Surgical History:  Procedure Laterality Date  . ABDOMINAL HYSTERECTOMY  2003   partial  . CATARACT EXTRACTION Bilateral   . COLONOSCOPY  02/2011   Dr. Doristine Mango: PROPOFOL. scattered diverticula, sessile polyp descending colon, path unavailable. recommenced five year follow up colonoscopy  . COLONOSCOPY N/A 04/06/2015   Procedure: COLONOSCOPY;  Surgeon: Danie Binder, MD;  Location: [facility];  Service: Endoscopy;  Laterality: N/A;  1115   . FOOT SURGERY     bilateral -bone removal  . KNEE CLOSED REDUCTION  04/02/2012   Procedure: CLOSED MANIPULATION KNEE;  Surgeon: Gearlean Alf, MD;  Location: [facility];  Service: Orthopedics;  Laterality: Left;  . TONSILLECTOMY     age 45  . TOTAL KNEE ARTHROPLASTY  01/30/2012   Procedure: TOTAL KNEE ARTHROPLASTY;  Surgeon: Gearlean Alf, MD;  Location: [facility];  Service: Orthopedics;  Laterality: Left;    Current Outpatient Medications  Medication Sig Dispense Refill  . acetaminophen (TYLENOL) 500 MG tablet Take 1,000 mg by mouth every 6 (six) hours as needed for mild pain.    Marland Kitchen BREO ELLIPTA 100-25 MCG/INH AEPB  Inhale 1 puff into the lungs daily.    . ciprofloxacin (CIPRO) 500 MG tablet Take 1 tablet (500 mg total) by mouth 2 (two) times daily. 28 tablet 0  . cyclobenzaprine (FLEXERIL) 10 MG tablet Take 10 mg by mouth at bedtime as needed for muscle spasms.    . DEXILANT 60 MG capsule TAKE 1 CAPSULE BY MOUTH ONCE DAILY (Patient taking differently: Take 1 capsule by mouth daily.) 90 capsule 3  . hydrOXYzine (VISTARIL) 25 MG capsule Take 25 mg by mouth 3 (three) times daily as needed for itching.    . metroNIDAZOLE (FLAGYL) 500 MG  tablet Take 1 tablet (500 mg total) by mouth 3 (three) times daily. 42 tablet 0  . montelukast (SINGULAIR) 10 MG tablet Take 10 mg by mouth at bedtime.    . ondansetron (ZOFRAN) 4 MG tablet Take 1 tablet (4 mg total) by mouth every 8 (eight) hours as needed for nausea or vomiting. 20 tablet 0  . Tetrahydrozoline HCl (VISINE OP) Place 1 drop into both eyes daily as needed (dry eyes).     . lubiprostone (AMITIZA) 8 MCG capsule Take 1 capsule (8 mcg total) by mouth 2 (two) times daily with a meal. (Patient not taking: Reported on 06/19/2020) 60 capsule 5   No current facility-administered medications for this visit.    Allergies as of 06/19/2020 - Review Complete 06/19/2020  Allergen Reaction Noted  . Codeine Nausea And Vomiting 01/20/2012  . Morphine and related Nausea And Vomiting 01/20/2012    Family History  Problem Relation Age of Onset  . Liver cancer Sister        deceased  . Colon cancer Neg Hx     Social History   Socioeconomic History  . Marital status: Single    Spouse name: Not on file  . Number of children: 3  . Years of education: Not on file  . Highest education level: Not on file  Occupational History  . Occupation: out of work  Tobacco Use  . Smoking status: Former Smoker    Packs/day: 0.50    Years: 40.00    Pack years: 20.00    Types: Cigarettes    Quit date: 01/28/2012    Years since quitting: 8.3  . Smokeless tobacco: Never Used  Vaping Use  . Vaping Use: Never used  Substance and Sexual Activity  . Alcohol use: No  . Drug use: No  . Sexual activity: Not Currently  Other Topics Concern  . Not on file  Social History Narrative  . Not on file   Social Determinants of Health   Financial Resource Strain: Not on file  Food Insecurity: Not on file  Transportation Needs: Not on file  Physical Activity: Not on file  Stress: Not on file  Social Connections: Not on file  Intimate Partner Violence: Not on file    Review of Systems: Gen: Denies any  fever, chills, cold or flulike symptoms, presyncope, syncope. CV: Denies chest pain or palpitations. Resp: Denies shortness of breath or cough. GI: See HPI GU : Denies urinary burning, urinary frequency, urinary hesitancy Heme: See HPI  Physical Exam: BP (!) 145/80   Pulse 75   Temp (!) 97.1 F (36.2 C) (Temporal)   Ht 5\' 3"  (1.6 m)   Wt 238 lb 3.2 oz (108 kg)   BMI 42.20 kg/m  General:   Alert and oriented. Pleasant and cooperative. Well-nourished and well-developed.  Head:  Normocephalic and atraumatic. Eyes:  Without icterus, sclera clear and  conjunctiva pink.  Ears:  Normal auditory acuity. Lungs:  Clear to auscultation bilaterally. No wheezes, rales, or rhonchi. No distress.  Heart:  S1, S2 present without murmurs appreciated.  Abdomen:  +BS, soft, and non-distended. Mild to moderate TTP and LLQ and suprapubic area.  No HSM noted. No guarding or rebound. No masses appreciated.  Rectal:  Deferred  Msk:  Symmetrical without gross deformities. Normal posture. Extremities:  Without edema Neurologic:  Alert and  oriented x4;  grossly normal neurologically. Skin:  Intact without significant lesions or rashes. Psych: Normal mood and affect.   Assessment: 65 year old female presenting today for persistent left lower quadrant and suprapubic abdominal pain after recently being diagnosed with uncomplicated sigmoid diverticulitis in the emergency room on 3/19.  She was treated by her primary care provider with Augmentin twice daily x7 days, completed 2 days ago, with no improvement in her abdominal pain.  Also notes when she eats, she has to have a bowel movement, but denies loose stools or diarrhea.  This started with Augmentin.  Suspect antibiotic effect.  No BRBPR or melena.  No fever or chills and she is nontoxic-appearing today.  She does have mild nausea without vomiting.  On exam, she has mild to moderate TTP in LLQ and suprapubic area without rebound or guarding.  Suspect  persistent diverticulitis as she did not receive the appropriate antibiotic regimen.  Overall, her appearance and exam are reassuring.  Doubt complicated diverticulitis.   Plan: 1.  Start ciprofloxacin 500 mg twice daily and Flagyl 500 mg 3 times daily x14 days. 2.  Zofran 4 mg every 8 hours as needed for nausea. 3.  Clear liquid diet for the next 24-48 hours.  May advance to soft diet if abdominal pain is improving. 4.  Use Tylenol 500 mg every 6 hours for abdominal pain.  Do not exceed 3000 mg of Tylenol in 24 hours. 5.  Requested progress report on Monday (3/28). 6.  Recommended proceeding to the emergency room if any worsening symptoms. 7.  Follow-up in 4-6 weeks.   Aliene Altes, PA-C [facility] Gastroenterology 06/19/2020

## 2020-06-19 ENCOUNTER — Ambulatory Visit: Payer: Medicare Other | Admitting: Gastroenterology

## 2020-06-19 ENCOUNTER — Other Ambulatory Visit: Payer: Self-pay

## 2020-06-19 ENCOUNTER — Encounter: Payer: Self-pay | Admitting: Gastroenterology

## 2020-06-19 VITALS — BP 145/80 | HR 75 | Temp 97.1°F | Ht 63.0 in | Wt 238.2 lb

## 2020-06-19 DIAGNOSIS — R110 Nausea: Secondary | ICD-10-CM

## 2020-06-19 DIAGNOSIS — K5732 Diverticulitis of large intestine without perforation or abscess without bleeding: Secondary | ICD-10-CM | POA: Insufficient documentation

## 2020-06-19 DIAGNOSIS — R1030 Lower abdominal pain, unspecified: Secondary | ICD-10-CM

## 2020-06-19 MED ORDER — CIPROFLOXACIN HCL 500 MG PO TABS: 500.0000 mg | ORAL_TABLET | Freq: Two times a day (BID) | ORAL | 0 refills | Status: DC

## 2020-06-19 MED ORDER — ONDANSETRON HCL 4 MG PO TABS: 4.0000 mg | ORAL_TABLET | Freq: Three times a day (TID) | ORAL | 0 refills | Status: DC | PRN

## 2020-06-19 MED ORDER — METRONIDAZOLE 500 MG PO TABS: 500.0000 mg | ORAL_TABLET | Freq: Three times a day (TID) | ORAL | 0 refills | Status: DC

## 2020-06-19 NOTE — Patient Instructions (Addendum)
Start ciprofloxacin 500 mg twice daily and Flagyl (metronidazole) 500 mg 3 times daily for the next 14 days to treat diverticulitis.  I am also sending in Zofran 4 mg for nausea.  You may take this every 8 hours as needed.  Take Tylenol 500 mg every 6 hours to help with abdominal pain.  Do not exceed more than 3000 mg of Tylenol for 24 hours.  Back down to a clear liquid diet for at least the next 24 hours.  If your abdominal pain is improving, you may advance to a soft diet.  Please call on Monday with a progress report.  If you have any worsening symptoms over the weekend, proceed to the emergency room.  Aliene Altes, PA-C [facility] Gastroenterology

## 2020-06-23 NOTE — Progress Notes (Signed)
CC'ED TO PCP 

## 2020-07-29 NOTE — Progress Notes (Signed)
Referring Provider: Caryl Bis, MD Primary Care Physician:  Caryl Bis, MD Primary GI Physician: Dr. Abbey Chatters  Chief Complaint  Patient presents with  . Abdominal Pain    Pain was better on ABT but hurting left side and around navel again  . Constipation    Bm was better while on ABT  . Nausea    "little bit"    HPI:   Holly [name] is a 65 y.o. female presenting today for follow-up.  Last seen in our office 06/19/20.  She had been diagnosed with uncomplicated sigmoid diverticulitis in the emergency room on 3/19.  PCP had treated her with Augmentin twice daily x7 days which was completed 2 days prior to her office visit.  She had no improvement in her abdominal pain.  Also noted postprandial bowel movements without loose stools or diarrhea which is started since taking Augmentin. Mild nausea without vomiting. No BRBPR or melena.  On exam, she had mild to moderate TTP in the LLQ and suprapubic area without rebound or guarding. Suspected persistent diverticulitis as she did not receive appropriate abx course.  She was started on Cipro 500 twice daily and Flagyl 500 3 times daily x14 days.  Recommended clear liquid diet x 24 to 48 hours and advance slowly.  Requested progress report in 3 days and follow-up in 4-6 weeks.   No progress report received.   Today:  Abdominal pain improved while on antibiotics, but she is starting to have mild pain again on left side and around navel. Intermittent. Usually notices pain when she wakes up in the morning. Can wake her up from sleep.  Worse prior to having a BM. Some improvement with BMs.  While on antibiotics, states her bowels are moving well and she felt better.  Currently with BMs 1-2 times a week that are hard and nonproductive.  Has to force them.  In general, her abdominal pain is unaffected by meals.  Pain started about 1 week after taking her antibiotics. Hasn't gotten any worse. Pain is about a 2-3/10. Not nearly as bad as when she  had diverticulitis. Mild nausea a couple days ago without vomiting. Nothing significant.   Linzess caused diarrhea. Amitiza "tore her stomach up". No blood in the stools or black stools. No fever or chills.   Pain can radiate from her back at times. Will use an ice pack and feel this helps.   Feels she has a lot of acid on her stomach. Reflux is worse when waking in the morning. Dexilant kept her symptoms well controlled, but she is out. No dysphagia.   Last colonoscopy 04/06/2015 with moderate diverticulosis throughout the entire colon, 1 hyperplastic polyp, 1 tubular adenoma, and moderate sized internal hemorrhoids. Recommended repeat in 5-10 years.    Past Medical History:  Diagnosis Date  . Arthritis    knees  . Constipation    chronic  . Diverticulitis 2006   2015, 2019  . Diverticulosis   . Enteritis   . Fatty liver disease, nonalcoholic OCT 1751 U/S [place]   DEC 2016 NL HFP [facility]  . Gallstone OCT 2016 [facility]  . GERD (gastroesophageal reflux disease)   . Hay fever   . Headache(784.0)   . Sinus problem     Past Surgical History:  Procedure Laterality Date  . ABDOMINAL HYSTERECTOMY  2003   partial  . CATARACT EXTRACTION Bilateral   . COLONOSCOPY  02/2011   Dr. Doristine Mango: PROPOFOL. scattered diverticula, sessile polyp  descending colon, path unavailable. recommenced five year follow up colonoscopy  . COLONOSCOPY N/A 04/06/2015   Surgeon: Danie Binder, MD;  moderate diverticulosis throughout the entire colon, 1 hyperplastic polyp, 1 tubular adenoma, and moderate sized internal hemorrhoids. Recommended repeat in 5-10 years.   Marland Kitchen FOOT SURGERY     bilateral -bone removal  . KNEE CLOSED REDUCTION  04/02/2012   Procedure: CLOSED MANIPULATION KNEE;  Surgeon: Gearlean Alf, MD;  Location: [facility];  Service: Orthopedics;  Laterality: Left;  . TONSILLECTOMY     age 88  . TOTAL KNEE ARTHROPLASTY  01/30/2012   Procedure: TOTAL KNEE ARTHROPLASTY;  Surgeon: Gearlean Alf, MD;   Location: [facility];  Service: Orthopedics;  Laterality: Left;    Current Outpatient Medications  Medication Sig Dispense Refill  . acetaminophen (TYLENOL) 500 MG tablet Take 1,000 mg by mouth every 6 (six) hours as needed for mild pain.    Marland Kitchen BREO ELLIPTA 100-25 MCG/INH AEPB Inhale 1 puff into the lungs daily.    . cyclobenzaprine (FLEXERIL) 10 MG tablet Take 10 mg by mouth at bedtime as needed for muscle spasms.    . hydrOXYzine (VISTARIL) 25 MG capsule Take 25 mg by mouth 3 (three) times daily as needed for itching.    . montelukast (SINGULAIR) 10 MG tablet Take 10 mg by mouth at bedtime.    . ondansetron (ZOFRAN) 4 MG tablet Take 1 tablet (4 mg total) by mouth every 8 (eight) hours as needed for nausea or vomiting. 20 tablet 0  . Tetrahydrozoline HCl (VISINE OP) Place 1 drop into both eyes daily as needed (dry eyes).     Marland Kitchen dexlansoprazole (DEXILANT) 60 MG capsule Take 1 capsule (60 mg total) by mouth daily. 90 capsule 3   No current facility-administered medications for this visit.    Allergies as of 07/30/2020 - Review Complete 06/19/2020  Allergen Reaction Noted  . Codeine Nausea And Vomiting 01/20/2012  . Morphine and related Nausea And Vomiting 01/20/2012    Family History  Problem Relation Age of Onset  . Liver cancer Sister        deceased  . Colon cancer Neg Hx     Social History   Socioeconomic History  . Marital status: Single    Spouse name: Not on file  . Number of children: 3  . Years of education: Not on file  . Highest education level: Not on file  Occupational History  . Occupation: out of work  Tobacco Use  . Smoking status: Former Smoker    Packs/day: 0.50    Years: 40.00    Pack years: 20.00    Types: Cigarettes    Quit date: 01/28/2012    Years since quitting: 8.5  . Smokeless tobacco: Never Used  Vaping Use  . Vaping Use: Never used  Substance and Sexual Activity  . Alcohol use: No  . Drug use: No  . Sexual activity: Not Currently  Other  Topics Concern  . Not on file  Social History Narrative  . Not on file   Social Determinants of Health   Financial Resource Strain: Not on file  Food Insecurity: Not on file  Transportation Needs: Not on file  Physical Activity: Not on file  Stress: Not on file  Social Connections: Not on file    Review of Systems: Gen: Admits to seasonal allergies and currently struggling with nasal congestion/mild cough.  No fever, chills, presyncope, syncope. CV: Denies chest pain or palpitations. Resp: Denies shortness of  breath. GI: See HPI Heme: See HPI  Physical Exam: BP (!) 147/79   Pulse 75   Temp (!) 97.3 F (36.3 C) (Temporal)   Ht 5\' 3"  (1.6 m)   Wt 235 lb 12.8 oz (107 kg)   BMI 41.77 kg/m  General:   Alert and oriented. No distress noted. Pleasant and cooperative.  Head:  Normocephalic and atraumatic. Eyes:  Conjuctiva clear without scleral icterus. Heart:  S1, S2 present without murmurs appreciated. Lungs:  Clear to auscultation bilaterally. No wheezes, rales, or rhonchi. No distress.  Abdomen:  +BS.  Abdomen is obese but soft.  Minimal TTP in the LLQ, LUQ, RUQ. No rebound or guarding. No HSM or masses noted. Msk:  Symmetrical without gross deformities. Normal posture. Extremities:  Without edema. Neurologic:  Alert and  oriented x4 Psych: Normal mood and affect.   Assessment: 65 year old female with recent episode of uncomplicated sigmoid diverticulitis in March 2019 s/p treatment with Augmentin twice daily x7 days, followed by Cipro 500 mg twice daily and Flagyl 500 mg 3 times daily x14 days due to persistent symptoms presenting today for follow-up.  She reports clinical improvement in her abdominal pain while on antibiotics, but mild left-sided and periumbilical abdominal pain returned about 1 week later. Pain is mild (2-3/10 in severity) and intermittent, may be present when waking in the morning, wakes her up from sleep at times, and gets worse prior to bowel movements  with some improvement thereafter.  While on antibiotics, her bowels are moving well, but since, she is back to BMs 1-2 times a week which is a chronic problem for her that is not adequately managed.  Notably, per chart review, she has previously reported bilateral flank and periumbilical pain in the setting of chronic constipation.  Previously Linzess and Amitiza were too strong. Denies BRBPR, melena, fever, chills.  Abdominal exam with minimal TTP in the LLQ, LUQ, RUQ.   I suspect her mild abdominal pain is likely secondary to chronic uncontrolled constipation rather than persistent diverticulitis.  As she reports Linzess and Amitiza were too strong in the past, we will try her on MiraLAX daily.  Requested a progress report in 1-2-weeks.  Also advised if she has any worsening abdominal pain, to let me know immediately, and we can repeat a CT scan.   Notably, she does need repeat colonoscopy to follow-up on her recent episode of diverticulitis.  Last colonoscopy was in 2017 with moderate diverticulosis throughout the entire colon, 1 hyperplastic polyp, 1 tubular adenoma, and moderate sized internal hemorrhoids with recommendations to repeat in 5-10 years.  We will need to get this scheduled in the near future.  GERD: Chronic.  Previously well-controlled on Dexilant 60 mg daily, but she has run out, so currently with uncontrolled symptoms.  No alarm symptoms.  I have sent a new prescription of Dexilant to her pharmacy.  Plan: 1.  Start MiraLAX 1 capful (17 g) daily in 8 ounces of water.  Requested progress report in 1-2 weeks.  If this does not work well, we can try Trulance.  2.  Resume Dexilant 60 mg daily.  Prescription sent to pharmacy.  3.  Reinforced GERD diet/lifestyle.  4.  Advised to monitor for any worsening abdominal pain and let me know if this occurs.  Would need to pursue repeat CT A/P with contrast.  5.  Follow-up in 3 months.  Will need to discuss scheduling colonoscopy at that  time.    Aliene Altes, PA-C [facility] Gastroenterology 07/30/2020

## 2020-07-30 ENCOUNTER — Ambulatory Visit: Payer: Medicare Other | Admitting: Gastroenterology

## 2020-07-30 ENCOUNTER — Other Ambulatory Visit: Payer: Self-pay

## 2020-07-30 ENCOUNTER — Encounter: Payer: Self-pay | Admitting: Gastroenterology

## 2020-07-30 VITALS — BP 147/79 | HR 75 | Temp 97.3°F | Ht 63.0 in | Wt 235.8 lb

## 2020-07-30 DIAGNOSIS — K5900 Constipation, unspecified: Secondary | ICD-10-CM

## 2020-07-30 DIAGNOSIS — Z8719 Personal history of other diseases of the digestive system: Secondary | ICD-10-CM | POA: Diagnosis not present

## 2020-07-30 DIAGNOSIS — K219 Gastro-esophageal reflux disease without esophagitis: Secondary | ICD-10-CM

## 2020-07-30 MED ORDER — DEXLANSOPRAZOLE 60 MG PO CPDR: 1.0000 | DELAYED_RELEASE_CAPSULE | Freq: Every day | ORAL | 3 refills | Status: DC

## 2020-07-30 NOTE — Patient Instructions (Addendum)
Start MiraLAX 1 capful (17 g) daily in of water for constipation.  Please call in 1-2 weeks with a progress report on constipation.  MiraLAX is not working well, we will try Trulance.  Monitor your abdominal pain.  Let me know if he has any worsening as we will need to pursue a repeat CT scanning.  Resume Dexilant 60 mg daily every morning.  I sent in a prescription to your pharmacy.  Follow a GERD diet:  Avoid fried, fatty, greasy, spicy, citrus foods. Avoid caffeine and carbonated beverages. Avoid chocolate. Try eating 4-6 small meals a day rather than 3 large meals. Do not eat within 3 hours of laying down. Prop head of bed up on wood or bricks to create a 6 inch incline.  Plan to follow-up in the office in 3 months.  Please call with any questions or concerns prior.  Aliene Altes, PA-C [facility] Gastroenterology

## 2020-07-31 NOTE — Progress Notes (Signed)
Cc'ed to pcp °

## 2020-09-18 DIAGNOSIS — J301 Allergic rhinitis due to pollen: Secondary | ICD-10-CM | POA: Diagnosis not present

## 2020-09-18 DIAGNOSIS — J4530 Mild persistent asthma, uncomplicated: Secondary | ICD-10-CM | POA: Diagnosis not present

## 2020-09-18 DIAGNOSIS — J209 Acute bronchitis, unspecified: Secondary | ICD-10-CM | POA: Diagnosis not present

## 2020-09-18 DIAGNOSIS — G2581 Restless legs syndrome: Secondary | ICD-10-CM | POA: Diagnosis not present

## 2020-09-18 DIAGNOSIS — Z8719 Personal history of other diseases of the digestive system: Secondary | ICD-10-CM | POA: Diagnosis not present

## 2020-11-04 ENCOUNTER — Encounter: Payer: Self-pay | Admitting: Internal Medicine

## 2020-12-04 ENCOUNTER — Ambulatory Visit: Payer: Medicare Other | Admitting: Gastroenterology

## 2021-03-01 NOTE — Progress Notes (Signed)
Referring Provider: Caryl Bis, MD Primary Care Physician:  Caryl Bis, MD Primary GI Physician: Dr. Abbey Chatters  Chief Complaint  Patient presents with   Abdominal Pain    Across lower abd, comes/goes   Constipation    Can go 3-4 days w/o BM. Take miralax. Has a lot of gas all the time   Gastroesophageal Reflux    F/u. Doing okay taking dexilant daily     HPI:   Holly [name] is a 65 y.o. female with history of uncomplicated sigmoid diverticulitis in March 2022, constipation, adenomatous colon polyps, due for surveillance between 2022-2027, GERD, presenting today for follow-up.  Last seen in our office 07/30/2020.  Reported her abdominal pain improved while on antibiotics for diverticulitis, but she had return of mild pain in the left lower abdomen and around her navel about 1 week after completing her course of antibiotics (early April).  It was intermittent, mild about 2-3 out of 10 in severity, not worsening. Pain may be present on waking in the morning, wakes her from sleep at times, and worsens prior to bowel movements.  She was struggling with constipation with 1-2 BMs per week that were hard and nonproductive; suspected this was contributing to her abdominal pain. Reported Linzess caused diarrhea and Amitiza "tore her stomach up".  Denies BRBPR or melena.  GERD uncontrolled off Dexilant.  Plan included MiraLAX daily, resume Dexilant daily, follow-up in 3 months to discuss scheduling colonoscopy.  Today: She is taking MiraLAX every other day and having bowel movements about every other day compared to every 3-5 days when not taking anything.  Stools are usually soft when she has a bowel movement.  If taking MiraLAX daily, this causes "griping" of her stomach.  She also tried Metamucil which caused "griping" of her stomach.  Continues with intermittent mid to lower abdominal pain that is sharp.  Can happen at night while sleeping, often first thing in the morning, occasionally  after eating if she has not had a bowel movement.  If she has a bowel movement when she has pain, her abdominal pain gets better.  In general, she feels better when her colon is more empty.  Pain does not feel nearly as bad as when she had diverticulitis.  She states this pain has been present for years.  Denies BRBPR or melena.  Also reports chronic pain around her hysterectomy scar.   GERD: GERD is well controlled on Dexilant daily.  Denies dysphagia, nausea, vomiting.   Past Medical History:  Diagnosis Date   Arthritis    knees   Constipation    chronic   Diverticulitis 2006   2015, 2019, March 2022   Diverticulosis    Enteritis    Fatty liver disease, nonalcoholic OCT 3428 U/S [place]   DEC 2016 NL HFP [place]   Gallstone OCT 2016 U[facility]   GERD (gastroesophageal reflux disease)    Hay fever    Headache(784.0)    Sinus problem     Past Surgical History:  Procedure Laterality Date   ABDOMINAL HYSTERECTOMY  2003   partial   CATARACT EXTRACTION Bilateral    COLONOSCOPY  02/2011   Dr. Doristine Mango: PROPOFOL. scattered diverticula, sessile polyp descending colon, path unavailable. recommenced five year follow up colonoscopy   COLONOSCOPY N/A 04/06/2015   Surgeon: Danie Binder, MD;  moderate diverticulosis throughout the entire colon, 1 hyperplastic polyp, 1 tubular adenoma, and moderate sized internal hemorrhoids. Recommended repeat in 5-10 years.  FOOT SURGERY     bilateral -bone removal   KNEE CLOSED REDUCTION  04/02/2012   Procedure: CLOSED MANIPULATION KNEE;  Surgeon: Gearlean Alf, MD;  Location: [facility];  Service: Orthopedics;  Laterality: Left;   TONSILLECTOMY     age 34   TOTAL KNEE ARTHROPLASTY  01/30/2012   Procedure: TOTAL KNEE ARTHROPLASTY;  Surgeon: Gearlean Alf, MD;  Location: [facility];  Service: Orthopedics;  Laterality: Left;    Current Outpatient Medications  Medication Sig Dispense Refill   acetaminophen (TYLENOL) 500 MG tablet Take 1,000 mg by mouth every  6 (six) hours as needed for mild pain.     BREO ELLIPTA 100-25 MCG/INH AEPB Inhale 1 puff into the lungs daily.     cyclobenzaprine (FLEXERIL) 10 MG tablet Take 10 mg by mouth at bedtime as needed for muscle spasms.     dexlansoprazole (DEXILANT) 60 MG capsule Take 1 capsule (60 mg total) by mouth daily. 90 capsule 3   hydrOXYzine (VISTARIL) 25 MG capsule Take 25 mg by mouth 3 (three) times daily as needed for itching.     montelukast (SINGULAIR) 10 MG tablet Take 10 mg by mouth at bedtime.     ondansetron (ZOFRAN) 4 MG tablet Take 1 tablet (4 mg total) by mouth every 8 (eight) hours as needed for nausea or vomiting. 20 tablet 0   polyethylene glycol (MIRALAX / GLYCOLAX) 17 g packet Take 17 g by mouth daily.     Tetrahydrozoline HCl (VISINE OP) Place 1 drop into both eyes daily as needed (dry eyes).      CLENPIQ 10-3.5-12 MG-GM -GM/160ML SOLN Take 1 kit by mouth once for 1 dose. 320 mL 0   No current facility-administered medications for this visit.    Allergies as of 03/03/2021 - Review Complete 03/03/2021  Allergen Reaction Noted   Codeine Nausea And Vomiting 01/20/2012   Morphine and related Nausea And Vomiting 01/20/2012    Family History  Problem Relation Age of Onset   Liver cancer Sister        deceased   Colon cancer Neg Hx     Social History   Socioeconomic History   Marital status: Single    Spouse name: Not on file   Number of children: 3   Years of education: Not on file   Highest education level: Not on file  Occupational History   Occupation: out of work  Tobacco Use   Smoking status: Former    Packs/day: 0.50    Years: 40.00    Pack years: 20.00    Types: Cigarettes    Quit date: 01/28/2012    Years since quitting: 9.1   Smokeless tobacco: Never  Vaping Use   Vaping Use: Never used  Substance and Sexual Activity   Alcohol use: No   Drug use: No   Sexual activity: Not Currently  Other Topics Concern   Not on file  Social History Narrative   Not on  file   Social Determinants of Health   Financial Resource Strain: Not on file  Food Insecurity: Not on file  Transportation Needs: Not on file  Physical Activity: Not on file  Stress: Not on file  Social Connections: Not on file    Review of Systems: Gen: Denies fever, chills, cold or flulike symptoms, presyncope, syncope. CV: Denies chest pain, palpitations. Resp: Admits to chronic shortness of breath with exertion.  No shortness of breath at rest.  No regular cough. GI: See HPI Heme: See HPI  Physical Exam: BP (!) 158/95   Pulse 87   Temp (!) 97.1 F (36.2 C)   Ht $R'5\' 3"'qt$  (1.6 m)   Wt 240 lb 9.6 oz (109.1 kg)   BMI 42.62 kg/m  General:   Alert and oriented. No distress noted. Pleasant and cooperative.  Head:  Normocephalic and atraumatic. Eyes:  Conjuctiva clear without scleral icterus. Heart:  S1, S2 present without murmurs appreciated. Lungs:  Clear to auscultation bilaterally. No wheezes, rales, or rhonchi. No distress.  Abdomen:  +BS, soft, and non-distended.  Minimal TTP across lower abdomen.  No rebound or guarding. No HSM or masses noted. Msk:  Symmetrical without gross deformities. Normal posture. Extremities:  Without edema. Neurologic:  Alert and  oriented x4 Psych: Normal mood and affect.    Assessment: 65 year old female with history of diverticulitis, most recently with uncomplicated sigmoid diverticulitis in March 2022, chronic constipation with chronic abdominal pain, history of adenomatous colon polyps, and GERD, presenting today for follow-up.   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  Interestingly, she has wake from sleep with pain at times, but usually her pain is in the morning or can be after meals if she has not had a good bowel movement. In general, she feels much better when her colon is more empty.  Previously  Linzess 145 mcg caused diarrhea and Amitiza previously "tore her stomach up".  Currently on MiraLAX every other day with BMs about every other day. Unable to take MiraLAX this daily as this causes "griping" of her stomach.  Metamucil also causes "griping".  Denies BRBPR or melena.  Reports her pain does not feel similar to prior diverticulitis episode.  CT A/P in March 2022 did not reveal any other significant findings aside from uncomplicated sigmoid diverticulitis.  Her abdominal exam reveals very minimal TTP across lower abdomen. Notably, patient also reports chronic TTP at hysterectomy scar. Adhesions could also be playing a role here.   We will plan to try her on Linzess 72 micrograms daily.  She also needs colonoscopy for surveillance purposes and to follow-up on diverticulitis episode earlier this year which we will work on arranging. This will also help ensure no other colonic etiology contributing to her abdominal pain.  History of adenomatous colon polyps: Due for surveillance.  Last colonoscopy in January 2017 with moderate diverticulosis throughout the entire colon, 1 hyperplastic polyp, 1 tubular adenoma, and moderate size internal hemorrhoids with recommendations to repeat in 5-10 years.  GERD: Well-controlled on Dexilant 60 mg daily.  No alarm symptoms.   Plan:  Proceed with colonoscopy with propofol with Dr. Abbey Chatters in the near future. Proceed with upper endoscopy by Dr. Gala Romney in near future: the risks, benefits, and alternatives have been discussed with the patient in detail. The patient states understanding and desires to proceed. ASA 3  Stop MiraLAX. Start Linzess 72 mcg daily 30 minutes before breakfast.  Samples provided.  Requested progress report in 1-2 weeks. Monitor for worsening abdominal pain and let me know if this occurs.  Would pursue repeat CT to ensure no recurrent diverticulitis. Continue Dexilant 60 mg daily. Reinforced GERD diet/lifestyle. Follow-up after  colonoscopy.   Aliene Altes, PA-C [facility] 03/03/2021

## 2021-03-01 NOTE — H&P (View-Only) (Signed)
Referring Provider: Caryl Bis, MD Primary Care Physician:  Caryl Bis, MD Primary GI Physician: Dr. Abbey Chatters  Chief Complaint  Patient presents with   Abdominal Pain    Across lower abd, comes/goes   Constipation    Can go 3-4 days w/o BM. Take miralax. Has a lot of gas all the time   Gastroesophageal Reflux    F/u. Doing okay taking dexilant daily     HPI:   Holly [name] is a 65 y.o. female with history of uncomplicated sigmoid diverticulitis in March 2022, constipation, adenomatous colon polyps, due for surveillance between 2022-2027, GERD, presenting today for follow-up.  Last seen in our office 07/30/2020.  Reported her abdominal pain improved while on antibiotics for diverticulitis, but she had return of mild pain in the left lower abdomen and around her navel about 1 week after completing her course of antibiotics (early April).  It was intermittent, mild about 2-3 out of 10 in severity, not worsening. Pain may be present on waking in the morning, wakes her from sleep at times, and worsens prior to bowel movements.  She was struggling with constipation with 1-2 BMs per week that were hard and nonproductive; suspected this was contributing to her abdominal pain. Reported Linzess caused diarrhea and Amitiza "tore her stomach up".  Denies BRBPR or melena.  GERD uncontrolled off Dexilant.  Plan included MiraLAX daily, resume Dexilant daily, follow-up in 3 months to discuss scheduling colonoscopy.  Today: She is taking MiraLAX every other day and having bowel movements about every other day compared to every 3-5 days when not taking anything.  Stools are usually soft when she has a bowel movement.  If taking MiraLAX daily, this causes "griping" of her stomach.  She also tried Metamucil which caused "griping" of her stomach.  Continues with intermittent mid to lower abdominal pain that is sharp.  Can happen at night while sleeping, often first thing in the morning, occasionally  after eating if she has not had a bowel movement.  If she has a bowel movement when she has pain, her abdominal pain gets better.  In general, she feels better when her colon is more empty.  Pain does not feel nearly as bad as when she had diverticulitis.  She states this pain has been present for years.  Denies BRBPR or melena.  Also reports chronic pain around her hysterectomy scar.   GERD: GERD is well controlled on Dexilant daily.  Denies dysphagia, nausea, vomiting.   Past Medical History:  Diagnosis Date   Arthritis    knees   Constipation    chronic   Diverticulitis 2006   2015, 2019, March 2022   Diverticulosis    Enteritis    Fatty liver disease, nonalcoholic OCT 9798 U/S [place]   DEC 2016 NL HFP [place]   Gallstone OCT 2016 [facility]   GERD (gastroesophageal reflux disease)    Hay fever    Headache(784.0)    Sinus problem     Past Surgical History:  Procedure Laterality Date   ABDOMINAL HYSTERECTOMY  2003   partial   CATARACT EXTRACTION Bilateral    COLONOSCOPY  02/2011   Dr. Doristine Mango: PROPOFOL. scattered diverticula, sessile polyp descending colon, path unavailable. recommenced five year follow up colonoscopy   COLONOSCOPY N/A 04/06/2015   Surgeon: Danie Binder, MD;  moderate diverticulosis throughout the entire colon, 1 hyperplastic polyp, 1 tubular adenoma, and moderate sized internal hemorrhoids. Recommended repeat in 5-10 years.  FOOT SURGERY     bilateral -bone removal   KNEE CLOSED REDUCTION  04/02/2012   Procedure: CLOSED MANIPULATION KNEE;  Surgeon: Gearlean Alf, MD;  Location: [facility];  Service: Orthopedics;  Laterality: Left;   TONSILLECTOMY     age 73   TOTAL KNEE ARTHROPLASTY  01/30/2012   Procedure: TOTAL KNEE ARTHROPLASTY;  Surgeon: Gearlean Alf, MD;  Location: [facility];  Service: Orthopedics;  Laterality: Left;    Current Outpatient Medications  Medication Sig Dispense Refill   acetaminophen (TYLENOL) 500 MG tablet Take 1,000 mg by mouth every  6 (six) hours as needed for mild pain.     BREO ELLIPTA 100-25 MCG/INH AEPB Inhale 1 puff into the lungs daily.     cyclobenzaprine (FLEXERIL) 10 MG tablet Take 10 mg by mouth at bedtime as needed for muscle spasms.     dexlansoprazole (DEXILANT) 60 MG capsule Take 1 capsule (60 mg total) by mouth daily. 90 capsule 3   hydrOXYzine (VISTARIL) 25 MG capsule Take 25 mg by mouth 3 (three) times daily as needed for itching.     montelukast (SINGULAIR) 10 MG tablet Take 10 mg by mouth at bedtime.     ondansetron (ZOFRAN) 4 MG tablet Take 1 tablet (4 mg total) by mouth every 8 (eight) hours as needed for nausea or vomiting. 20 tablet 0   polyethylene glycol (MIRALAX / GLYCOLAX) 17 g packet Take 17 g by mouth daily.     Tetrahydrozoline HCl (VISINE OP) Place 1 drop into both eyes daily as needed (dry eyes).      CLENPIQ 10-3.5-12 MG-GM -GM/160ML SOLN Take 1 kit by mouth once for 1 dose. 320 mL 0   No current facility-administered medications for this visit.    Allergies as of 03/03/2021 - Review Complete 03/03/2021  Allergen Reaction Noted   Codeine Nausea And Vomiting 01/20/2012   Morphine and related Nausea And Vomiting 01/20/2012    Family History  Problem Relation Age of Onset   Liver cancer Sister        deceased   Colon cancer Neg Hx     Social History   Socioeconomic History   Marital status: Single    Spouse name: Not on file   Number of children: 3   Years of education: Not on file   Highest education level: Not on file  Occupational History   Occupation: out of work  Tobacco Use   Smoking status: Former    Packs/day: 0.50    Years: 40.00    Pack years: 20.00    Types: Cigarettes    Quit date: 01/28/2012    Years since quitting: 9.1   Smokeless tobacco: Never  Vaping Use   Vaping Use: Never used  Substance and Sexual Activity   Alcohol use: No   Drug use: No   Sexual activity: Not Currently  Other Topics Concern   Not on file  Social History Narrative   Not on  file   Social Determinants of Health   Financial Resource Strain: Not on file  Food Insecurity: Not on file  Transportation Needs: Not on file  Physical Activity: Not on file  Stress: Not on file  Social Connections: Not on file    Review of Systems: Gen: Denies fever, chills, cold or flulike symptoms, presyncope, syncope. CV: Denies chest pain, palpitations. Resp: Admits to chronic shortness of breath with exertion.  No shortness of breath at rest.  No regular cough. GI: See HPI Heme: See HPI  Physical Exam: BP (!) 158/95    Pulse 87    Temp (!) 97.1 F (36.2 C)    Ht $R'5\' 3"'RY$  (1.6 m)    Wt 240 lb 9.6 oz (109.1 kg)    BMI 42.62 kg/m  General:   Alert and oriented. No distress noted. Pleasant and cooperative.  Head:  Normocephalic and atraumatic. Eyes:  Conjuctiva clear without scleral icterus. Heart:  S1, S2 present without murmurs appreciated. Lungs:  Clear to auscultation bilaterally. No wheezes, rales, or rhonchi. No distress.  Abdomen:  +BS, soft, and non-distended.  Minimal TTP across lower abdomen.  No rebound or guarding. No HSM or masses noted. Msk:  Symmetrical without gross deformities. Normal posture. Extremities:  Without edema. Neurologic:  Alert and  oriented x4 Psych: Normal mood and affect.    Assessment: 65 year old female with history of diverticulitis, most recently with uncomplicated sigmoid diverticulitis in March 2022, chronic constipation with chronic abdominal pain, history of adenomatous colon polyps, and GERD, presenting today for follow-up.   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  Interestingly, she has wake from sleep with pain at times, but usually her pain is in the morning or can be after meals if she has not had a good bowel movement. In general, she feels much better when her colon is more empty.  Previously  Linzess 145 mcg caused diarrhea and Amitiza previously "tore her stomach up".  Currently on MiraLAX every other day with BMs about every other day. Unable to take MiraLAX this daily as this causes "griping" of her stomach.  Metamucil also causes "griping".  Denies BRBPR or melena.  Reports her pain does not feel similar to prior diverticulitis episode.  CT A/P in March 2022 did not reveal any other significant findings aside from uncomplicated sigmoid diverticulitis.  Her abdominal exam reveals very minimal TTP across lower abdomen. Notably, patient also reports chronic TTP at hysterectomy scar. Adhesions could also be playing a role here.   We will plan to try her on Linzess 72 micrograms daily.  She also needs colonoscopy for surveillance purposes and to follow-up on diverticulitis episode earlier this year which we will work on arranging. This will also help ensure no other colonic etiology contributing to her abdominal pain.  History of adenomatous colon polyps: Due for surveillance.  Last colonoscopy in January 2017 with moderate diverticulosis throughout the entire colon, 1 hyperplastic polyp, 1 tubular adenoma, and moderate size internal hemorrhoids with recommendations to repeat in 5-10 years.  GERD: Well-controlled on Dexilant 60 mg daily.  No alarm symptoms.   Plan:  Proceed with colonoscopy with propofol with Dr. Abbey Chatters in the near future. Proceed with upper endoscopy by Dr. Gala Romney in near future: the risks, benefits, and alternatives have been discussed with the patient in detail. The patient states understanding and desires to proceed. ASA 3  Stop MiraLAX. Start Linzess 72 mcg daily 30 minutes before breakfast.  Samples provided.  Requested progress report in 1-2 weeks. Monitor for worsening abdominal pain and let me know if this occurs.  Would pursue repeat CT to ensure no recurrent diverticulitis. Continue Dexilant 60 mg daily. Reinforced GERD diet/lifestyle. Follow-up after  colonoscopy.   Aliene Altes, PA-C [facility] 03/03/2021

## 2021-03-03 ENCOUNTER — Telehealth: Payer: Self-pay

## 2021-03-03 ENCOUNTER — Other Ambulatory Visit: Payer: Self-pay

## 2021-03-03 ENCOUNTER — Encounter: Payer: Self-pay | Admitting: Gastroenterology

## 2021-03-03 ENCOUNTER — Ambulatory Visit: Payer: Medicare Other | Admitting: Gastroenterology

## 2021-03-03 VITALS — BP 158/95 | HR 87 | Temp 97.1°F | Ht 63.0 in | Wt 240.6 lb

## 2021-03-03 DIAGNOSIS — K581 Irritable bowel syndrome with constipation: Secondary | ICD-10-CM

## 2021-03-03 DIAGNOSIS — K219 Gastro-esophageal reflux disease without esophagitis: Secondary | ICD-10-CM | POA: Diagnosis not present

## 2021-03-03 DIAGNOSIS — Z86010 Personal history of colonic polyps: Secondary | ICD-10-CM | POA: Diagnosis not present

## 2021-03-03 DIAGNOSIS — K5900 Constipation, unspecified: Secondary | ICD-10-CM | POA: Diagnosis not present

## 2021-03-03 DIAGNOSIS — Z8719 Personal history of other diseases of the digestive system: Secondary | ICD-10-CM | POA: Diagnosis not present

## 2021-03-03 DIAGNOSIS — R1030 Lower abdominal pain, unspecified: Secondary | ICD-10-CM | POA: Diagnosis not present

## 2021-03-03 MED ORDER — CLENPIQ 10-3.5-12 MG-GM -GM/160ML PO SOLN: 1.0000 | Freq: Once | ORAL | 0 refills | Status: AC

## 2021-03-03 NOTE — Telephone Encounter (Signed)
PA for TCS submitted via [facility] website. PA# [id number], valid 03/25/21-03/27/21.

## 2021-03-03 NOTE — Patient Instructions (Signed)
We will arrange for you to have a colonoscopy in the near future with Dr. Abbey Chatters.  Stop MiraLAX.  Start Linzess 72 mcg daily.  Take this medication first thing in the morning at least 30 minutes before your first meal. As we discussed, you may have diarrhea for a couple of weeks, but this should taper off. We are providing you with samples.  Please call with a progress report in 1-2 weeks on how Linzess is working for you.  If this is working well, I will send a prescription to your pharmacy.  Monitor your abdominal pain.  If you have any worsening, please let me know.  Continues taking Dexilant 60 mg daily for reflux.  Follow a GERD diet:  Avoid fried, fatty, greasy, spicy, citrus foods. Avoid caffeine and carbonated beverages. Avoid chocolate. Try eating 4-6 small meals a day rather than 3 large meals. Do not eat within 3 hours of laying down. Prop head of bed up on wood or bricks to create a 6 inch incline.  We will plan to see back in the office after your procedure.  Do not hesitate to call if you have any questions or concerns prior to your next visit.   It was great to see you again today!  I hope you have a very Merry Christmas!  Enjoy your sweet grandbaby!  Aliene Altes, PA-C [facility]

## 2021-03-18 NOTE — Patient Instructions (Signed)
Holly [name]  03/18/2021     @PREFPERIOPPHARMACY @   Your procedure is scheduled on  03/25/2021.   Report to [facility] at  1130 A.M.   Call this number if you have problems the morning of surgery:  [phone]   Remember:  Follow the diet and prep instructions given to you by the office.    Use your inhaler before you come and bring your rescue inhaler with you.    Take these medicines the morning of surgery with A SIP OF WATER                              Dexilant, zofran(if needed).    Do not wear jewelry, make-up or nail polish.  Do not wear lotions, powders, or perfumes, or deodorant.  Do not shave 48 hours prior to surgery.  Men may shave face and neck.  Do not bring valuables to the hospital.  [facility] is not responsible for any belongings or valuables.  Contacts, dentures or bridgework may not be worn into surgery.  Leave your suitcase in the car.  After surgery it may be brought to your room.  For patients admitted to the hospital, discharge time will be determined by your treatment team.  Patients discharged the day of surgery will not be allowed to drive home and must have someone with them for 24 hours.    Special instructions:   DO NOT smoke tobacco or vape for 24 hours before your procedure.  Please read over the following fact sheets that you were given. Anesthesia Post-op Instructions and Care and Recovery After Surgery      Colonoscopy, Adult, Care After This sheet gives you information about how to care for yourself after your procedure. Your health care provider may also give you more specific instructions. If you have problems or questions, contact your health care provider. What can I expect after the procedure? After the procedure, it is common to have: A small amount of blood in your stool for 24 hours after the procedure. Some gas. Mild cramping or bloating of your abdomen. Follow these instructions at home: Eating and  drinking  Drink enough fluid to keep your urine pale yellow. Follow instructions from your health care provider about eating or drinking restrictions. Resume your normal diet as instructed by your health care provider. Avoid heavy or fried foods that are hard to digest. Activity Rest as told by your health care provider. Avoid sitting for a long time without moving. Get up to take short walks every 1-2 hours. This is important to improve blood flow and breathing. Ask for help if you feel weak or unsteady. Return to your normal activities as told by your health care provider. Ask your health care provider what activities are safe for you. Managing cramping and bloating  Try walking around when you have cramps or feel bloated. Apply heat to your abdomen as told by your health care provider. Use the heat source that your health care provider recommends, such as a moist heat pack or a heating pad. Place a towel between your skin and the heat source. Leave the heat on for 20-30 minutes. Remove the heat if your skin turns bright red. This is especially important if you are unable to feel pain, heat, or cold. You may have a greater risk of getting burned. General instructions If you were given a sedative during the  procedure, it can affect you for several hours. Do not drive or operate machinery until your health care provider says that it is safe. For the first 24 hours after the procedure: Do not sign important documents. Do not drink alcohol. Do your regular daily activities at a slower pace than normal. Eat soft foods that are easy to digest. Take over-the-counter and prescription medicines only as told by your health care provider. Keep all follow-up visits as told by your health care provider. This is important. Contact a health care provider if: You have blood in your stool 2-3 days after the procedure. Get help right away if you have: More than a small spotting of blood in your  stool. Large blood clots in your stool. Swelling of your abdomen. Nausea or vomiting. A fever. Increasing pain in your abdomen that is not relieved with medicine. Summary After the procedure, it is common to have a small amount of blood in your stool. You may also have mild cramping and bloating of your abdomen. If you were given a sedative during the procedure, it can affect you for several hours. Do not drive or operate machinery until your health care provider says that it is safe. Get help right away if you have a lot of blood in your stool, nausea or vomiting, a fever, or increased pain in your abdomen. This information is not intended to replace advice given to you by your health care provider. Make sure you discuss any questions you have with your health care provider. Document Revised: 01/18/2019 Document Reviewed: 10/08/2018 Elsevier Patient Education  [place] After This sheet gives you information about how to care for yourself after your procedure. Your health care provider may also give you more specific instructions. If you have problems or questions, contact your health care provider. What can I expect after the procedure? After the procedure, it is common to have: Tiredness. Forgetfulness about what happened after the procedure. Impaired judgment for important decisions. Nausea or vomiting. Some difficulty with balance. Follow these instructions at home: For the time period you were told by your health care provider:   Rest as needed. Do not participate in activities where you could fall or become injured. Do not drive or use machinery. Do not drink alcohol. Do not take sleeping pills or medicines that cause drowsiness. Do not make important decisions or sign legal documents. Do not take care of children on your own. Eating and drinking Follow the diet that is recommended by your health care provider. Drink enough fluid to  keep your urine pale yellow. If you vomit: Drink water, juice, or soup when you can drink without vomiting. Make sure you have little or no nausea before eating solid foods. General instructions Have a responsible adult stay with you for the time you are told. It is important to have someone help care for you until you are awake and alert. Take over-the-counter and prescription medicines only as told by your health care provider. If you have sleep apnea, surgery and certain medicines can increase your risk for breathing problems. Follow instructions from your health care provider about wearing your sleep device: Anytime you are sleeping, including during daytime naps. While taking prescription pain medicines, sleeping medicines, or medicines that make you drowsy. Avoid smoking. Keep all follow-up visits as told by your health care provider. This is important. Contact a health care provider if: You keep feeling nauseous or you keep vomiting. You feel light-headed. You  are still sleepy or having trouble with balance after 24 hours. You develop a rash. You have a fever. You have redness or swelling around the IV site. Get help right away if: You have trouble breathing. You have new-onset confusion at home. Summary For several hours after your procedure, you may feel tired. You may also be forgetful and have poor judgment. Have a responsible adult stay with you for the time you are told. It is important to have someone help care for you until you are awake and alert. Rest as told. Do not drive or operate machinery. Do not drink alcohol or take sleeping pills. Get help right away if you have trouble breathing, or if you suddenly become confused. This information is not intended to replace advice given to you by your health care provider. Make sure you discuss any questions you have with your health care provider. Document Revised: 11/28/2019 Document Reviewed: 02/14/2019 Elsevier Patient  Education  2022 [employer].

## 2021-03-23 ENCOUNTER — Encounter (HOSPITAL_COMMUNITY)
Admission: RE | Admit: 2021-03-23 | Discharge: 2021-03-23 | Disposition: A | Discharge status: Home / Self Care | Payer: Medicare Other | Source: Ambulatory Visit | Attending: Internal Medicine | Admitting: Internal Medicine

## 2021-03-23 ENCOUNTER — Encounter (HOSPITAL_COMMUNITY): Payer: Self-pay

## 2021-03-23 ENCOUNTER — Other Ambulatory Visit: Payer: Self-pay

## 2021-03-24 ENCOUNTER — Telehealth: Payer: Self-pay | Admitting: *Deleted

## 2021-03-24 NOTE — Telephone Encounter (Signed)
Called pt to see if she can move procedure time up for tomorrow, no answer and no VM

## 2021-03-25 ENCOUNTER — Ambulatory Visit (HOSPITAL_COMMUNITY): Payer: Medicare Other | Admitting: Certified Registered"

## 2021-03-25 ENCOUNTER — Other Ambulatory Visit: Payer: Self-pay | Admitting: Gastroenterology

## 2021-03-25 ENCOUNTER — Encounter (HOSPITAL_COMMUNITY): Payer: Self-pay

## 2021-03-25 ENCOUNTER — Telehealth: Payer: Self-pay | Admitting: Internal Medicine

## 2021-03-25 ENCOUNTER — Encounter (HOSPITAL_COMMUNITY)
Admission: RE | Disposition: A | Discharge status: Home / Self Care | Payer: Self-pay | Source: Home / Self Care | Attending: Internal Medicine

## 2021-03-25 ENCOUNTER — Ambulatory Visit (HOSPITAL_COMMUNITY)
Admission: RE | Admit: 2021-03-25 | Discharge: 2021-03-25 | Disposition: A | Discharge status: Home / Self Care | Payer: Medicare Other | Attending: Internal Medicine | Admitting: Internal Medicine

## 2021-03-25 DIAGNOSIS — Z87891 Personal history of nicotine dependence: Secondary | ICD-10-CM | POA: Insufficient documentation

## 2021-03-25 DIAGNOSIS — K648 Other hemorrhoids: Secondary | ICD-10-CM | POA: Insufficient documentation

## 2021-03-25 DIAGNOSIS — Z8619 Personal history of other infectious and parasitic diseases: Secondary | ICD-10-CM | POA: Insufficient documentation

## 2021-03-25 DIAGNOSIS — K219 Gastro-esophageal reflux disease without esophagitis: Secondary | ICD-10-CM | POA: Insufficient documentation

## 2021-03-25 DIAGNOSIS — K581 Irritable bowel syndrome with constipation: Secondary | ICD-10-CM

## 2021-03-25 DIAGNOSIS — Z79899 Other long term (current) drug therapy: Secondary | ICD-10-CM | POA: Diagnosis not present

## 2021-03-25 DIAGNOSIS — Z86010 Personal history of colonic polyps: Secondary | ICD-10-CM | POA: Diagnosis not present

## 2021-03-25 DIAGNOSIS — K5909 Other constipation: Secondary | ICD-10-CM | POA: Insufficient documentation

## 2021-03-25 DIAGNOSIS — K635 Polyp of colon: Secondary | ICD-10-CM | POA: Insufficient documentation

## 2021-03-25 DIAGNOSIS — Z1211 Encounter for screening for malignant neoplasm of colon: Secondary | ICD-10-CM | POA: Diagnosis not present

## 2021-03-25 DIAGNOSIS — G8929 Other chronic pain: Secondary | ICD-10-CM | POA: Insufficient documentation

## 2021-03-25 DIAGNOSIS — K5730 Diverticulosis of large intestine without perforation or abscess without bleeding: Secondary | ICD-10-CM | POA: Diagnosis not present

## 2021-03-25 DIAGNOSIS — D123 Benign neoplasm of transverse colon: Secondary | ICD-10-CM

## 2021-03-25 DIAGNOSIS — K5140 Inflammatory polyps of colon without complications: Secondary | ICD-10-CM | POA: Diagnosis not present

## 2021-03-25 HISTORY — PX: POLYPECTOMY: SHX5525

## 2021-03-25 HISTORY — PX: COLONOSCOPY WITH PROPOFOL: SHX5780

## 2021-03-25 SURGERY — COLONOSCOPY WITH PROPOFOL
Anesthesia: General

## 2021-03-25 MED ORDER — PROPOFOL 10 MG/ML IV BOLUS
INTRAVENOUS | Status: DC | PRN
  Administered 2021-03-25: 120 mg via INTRAVENOUS
  Administered 2021-03-25: 50 mg via INTRAVENOUS
  Administered 2021-03-25: 80 mg via INTRAVENOUS
  Administered 2021-03-25: 50 mg via INTRAVENOUS

## 2021-03-25 MED ORDER — LIDOCAINE HCL (CARDIAC) PF 100 MG/5ML IV SOSY
PREFILLED_SYRINGE | INTRAVENOUS | Status: DC | PRN
Start: 2021-03-25 — End: 2021-03-25
  Administered 2021-03-25: 50 mg via INTRAVENOUS

## 2021-03-25 MED ORDER — LACTATED RINGERS IV SOLN
INTRAVENOUS | Status: DC
  Administered 2021-03-25: 12:00:00 1000 mL via INTRAVENOUS

## 2021-03-25 MED ORDER — LINACLOTIDE 72 MCG PO CAPS: 72.0000 ug | ORAL_CAPSULE | Freq: Every day | ORAL | 1 refills | Status: DC

## 2021-03-25 NOTE — Transfer of Care (Signed)
Immediate Anesthesia Transfer of Care Note  Patient: Holly [name]  Procedure(s) Performed: COLONOSCOPY WITH PROPOFOL POLYPECTOMY  Patient Location: Short Stay  Anesthesia Type:General  Level of Consciousness: awake  Airway & Oxygen Therapy: Patient Spontanous Breathing  Post-op Assessment: Report given to RN and Post -op Vital signs reviewed and stable  Post vital signs: Reviewed and stable  Last Vitals:  Vitals Value Taken Time  BP    Temp    Pulse    Resp    SpO2      Last Pain:  Vitals:   03/25/21 1159  TempSrc:   PainSc: 4       Patients Stated Pain Goal: 6 (53/91/22 5834)  Complications: No notable events documented.

## 2021-03-25 NOTE — Telephone Encounter (Signed)
Noted. Please let patient know I am sending in Rx for Linzess 72 mcg daily.

## 2021-03-25 NOTE — Anesthesia Preprocedure Evaluation (Signed)
Anesthesia Evaluation  Patient identified by MRN, date of birth, ID band Patient awake    Reviewed: Allergy & Precautions, H&P , NPO status , Patient's Chart, lab work & pertinent test results, reviewed documented beta blocker date and time   Airway Mallampati: II  TM Distance: >3 FB Neck ROM: full    Dental no notable dental hx.    Pulmonary neg pulmonary ROS, former smoker,    Pulmonary exam normal breath sounds clear to auscultation       Cardiovascular Exercise Tolerance: Good negative cardio ROS   Rhythm:regular Rate:Normal     Neuro/Psych  Headaches, negative psych ROS   GI/Hepatic Neg liver ROS, GERD  Medicated,  Endo/Other  negative endocrine ROS  Renal/GU negative Renal ROS  negative genitourinary   Musculoskeletal   Abdominal   Peds  Hematology negative hematology ROS (+)   Anesthesia Other Findings   Reproductive/Obstetrics negative OB ROS                             Anesthesia Physical Anesthesia Plan  ASA: 2  Anesthesia Plan: General   Post-op Pain Management:    Induction:   PONV Risk Score and Plan: Propofol infusion  Airway Management Planned:   Additional Equipment:   Intra-op Plan:   Post-operative Plan:   Informed Consent: I have reviewed the patients History and Physical, chart, labs and discussed the procedure including the risks, benefits and alternatives for the proposed anesthesia with the patient or authorized representative who has indicated his/her understanding and acceptance.     Dental Advisory Given  Plan Discussed with: CRNA  Anesthesia Plan Comments:         Anesthesia Quick Evaluation

## 2021-03-25 NOTE — Anesthesia Postprocedure Evaluation (Signed)
Anesthesia Post Note  Patient: Holly [name]  Procedure(s) Performed: COLONOSCOPY WITH PROPOFOL POLYPECTOMY  Patient location during evaluation: Phase II Anesthesia Type: General Level of consciousness: awake Pain management: pain level controlled Vital Signs Assessment: post-procedure vital signs reviewed and stable Respiratory status: spontaneous breathing and respiratory function stable Cardiovascular status: blood pressure returned to baseline and stable Postop Assessment: no headache and no apparent nausea or vomiting Anesthetic complications: no Comments: Late entry   No notable events documented.   Last Vitals:  Vitals:   03/25/21 1046 03/25/21 1219  BP: (!) 144/91 126/67  Pulse: 96 89  Resp: 18 16  Temp: 36.6 C 36.7 C  SpO2: 94% 95%    Last Pain:  Vitals:   03/25/21 1219  TempSrc: Oral  PainSc: 0-No pain                 Louann Sjogren

## 2021-03-25 NOTE — Telephone Encounter (Signed)
Patient said that the linzess samples worked well and she would like a prescription sent to [facility] in [place]

## 2021-03-25 NOTE — Telephone Encounter (Signed)
This is not a refill request. This is for new start [facility]. Cyril Mourning requested a progress report in 2-3 weeks, information should be sent to her. I will route to Roy.   Madelyn Flavors.

## 2021-03-25 NOTE — Interval H&P Note (Signed)
History and Physical Interval Note:  03/25/2021 11:48 AM  Holly [name]  has presented today for surgery, with the diagnosis of history of colon polyps, history of diverticulitis.  The various methods of treatment have been discussed with the patient and family. After consideration of risks, benefits and other options for treatment, the patient has consented to  Procedure(s) with comments: COLONOSCOPY WITH PROPOFOL (N/A) - 1:30pm as a surgical intervention.  The patient's history has been reviewed, patient examined, no change in status, stable for surgery.  I have reviewed the patient's chart and labs.  Questions were answered to the patient's satisfaction.     Eloise Harman

## 2021-03-25 NOTE — Telephone Encounter (Signed)
Noted  

## 2021-03-25 NOTE — Anesthesia Procedure Notes (Signed)
Date/Time: 03/25/2021 12:02 PM Performed by: Orlie Dakin, CRNA Pre-anesthesia Checklist: Patient identified, Emergency Drugs available, Suction available and Patient being monitored Patient Re-evaluated:Patient Re-evaluated prior to induction Oxygen Delivery Method: Nasal cannula Induction Type: IV induction Placement Confirmation: positive ETCO2

## 2021-03-25 NOTE — Discharge Instructions (Addendum)
°  Colonoscopy Discharge Instructions  Read the instructions outlined below and refer to this sheet in the next few weeks. These discharge instructions provide you with general information on caring for yourself after you leave the hospital. Your doctor may also give you specific instructions. While your treatment has been planned according to the most current medical practices available, unavoidable complications occasionally occur.   ACTIVITY You may resume your regular activity, but move at a slower pace for the next 24 hours.  Take frequent rest periods for the next 24 hours.  Walking will help get rid of the air and reduce the bloated feeling in your belly (abdomen).  No driving for 24 hours (because of the medicine (anesthesia) used during the test).   Do not sign any important legal documents or operate any machinery for 24 hours (because of the anesthesia used during the test).  NUTRITION Drink plenty of fluids.  You may resume your normal diet as instructed by your doctor.  Begin with a light meal and progress to your normal diet. Heavy or fried foods are harder to digest and may make you feel sick to your stomach (nauseated).  Avoid alcoholic beverages for 24 hours or as instructed.  MEDICATIONS You may resume your normal medications unless your doctor tells you otherwise.  WHAT YOU CAN EXPECT TODAY Some feelings of bloating in the abdomen.  Passage of more gas than usual.  Spotting of blood in your stool or on the toilet paper.  IF YOU HAD POLYPS REMOVED DURING THE COLONOSCOPY: No aspirin products for 7 days or as instructed.  No alcohol for 7 days or as instructed.  Eat a soft diet for the next 24 hours.  FINDING OUT THE RESULTS OF YOUR TEST Not all test results are available during your visit. If your test results are not back during the visit, make an appointment with your caregiver to find out the results. Do not assume everything is normal if you have not heard from your  caregiver or the medical facility. It is important for you to follow up on all of your test results.  SEEK IMMEDIATE MEDICAL ATTENTION IF: You have more than a spotting of blood in your stool.  Your belly is swollen (abdominal distention).  You are nauseated or vomiting.  You have a temperature over 101.  You have abdominal pain or discomfort that is severe or gets worse throughout the day.   Your colonoscopy revealed 2 polyp(s) which I removed successfully. Await pathology results, my office will contact you. I recommend repeating colonoscopy in 5 years for surveillance purposes. You also have diverticulosis and internal hemorrhoids. I would recommend increasing fiber in your diet or adding OTC Benefiber/Metamucil. Be sure to drink at least 4 to 6 glasses of water daily. Follow-up with GI in 4 months    I hope you have a great rest of your week!  Elon Alas. Abbey Chatters, D.O. Gastroenterology and Hepatology [facility]

## 2021-03-25 NOTE — Op Note (Signed)
[facility] Patient Name: Holly [name] Procedure Date: 03/25/2021 11:44 AM MRN: [id number] Date of Birth: 1955/11/15 Attending MD: Elon Alas. Abbey Chatters DO CSN: [id number] Age: 65 Admit Type: Outpatient Procedure:                Colonoscopy Indications:              Surveillance: Personal history of adenomatous                            polyps on last colonoscopy 5 years ago, High risk                            colon cancer surveillance: Personal history of                            adenoma (10 mm or greater in size) Providers:                Elon Alas. Abbey Chatters, DO, Lambert Mody, Aram Candela Referring MD:              Medicines:                See the Anesthesia note for documentation of the                            administered medications Complications:            No immediate complications. Estimated Blood Loss:     Estimated blood loss was minimal. Procedure:                Pre-Anesthesia Assessment:                           - The anesthesia plan was to use monitored                            anesthesia care (MAC).                           After obtaining informed consent, the colonoscope                            was passed under direct vision. Throughout the                            procedure, the patient's blood pressure, pulse, and                            oxygen saturations were monitored continuously. The                            [id number]-HQ190L ([id number]) scope was introduced through                            the anus and advanced to the the cecum,  identified                            by appendiceal orifice and ileocecal valve. The                            colonoscopy was performed without difficulty. The                            patient tolerated the procedure well. The quality                            of the bowel preparation was evaluated using the                            BBPS [facility] Bowel Preparation Scale) with  scores                            of: Right Colon = 2 (minor amount of residual                            staining, small fragments of stool and/or opaque                            liquid, but mucosa seen well), Transverse Colon = 3                            (entire mucosa seen well with no residual staining,                            small fragments of stool or opaque liquid) and Left                            Colon = 3 (entire mucosa seen well with no residual                            staining, small fragments of stool or opaque                            liquid). The total BBPS score equals 8. The quality                            of the bowel preparation was good. Scope In: 12:02:16 PM Scope Out: 12:16:24 PM Scope Withdrawal Time: 0 hours 9 minutes 29 seconds  Total Procedure Duration: 0 hours 14 minutes 8 seconds  Findings:      The perianal and digital rectal examinations were normal.      Non-bleeding internal hemorrhoids were found during endoscopy.      Multiple small and large-mouthed diverticula were found in the sigmoid       colon.      Two sessile polyps were found in the transverse colon. The polyps were 3       to 5 mm in size. These polyps were removed with a  cold snare. Resection       and retrieval were complete. One polyp not retrieved Impression:               - Non-bleeding internal hemorrhoids.                           - Diverticulosis in the sigmoid colon.                           - Two 3 to 5 mm polyps in the transverse colon,                            removed with a cold snare. Resected and retrieved. Moderate Sedation:      Per Anesthesia Care Recommendation:           - Patient has a contact number available for                            emergencies. The signs and symptoms of potential                            delayed complications were discussed with the                            patient. Return to normal activities tomorrow.                             Written discharge instructions were provided to the                            patient.                           - Resume previous diet.                           - Continue present medications.                           - Await pathology results.                           - Repeat colonoscopy in 5 years for surveillance.                           - Return to GI clinic PRN. Procedure Code(s):        --- Professional ---                           [phone], Colonoscopy, flexible; with removal of                            tumor(s), polyp(s), or other lesion(s) by snare                            technique Diagnosis Code(s):        ---  Professional ---                           K64.8, Other hemorrhoids                           K63.5, Polyp of colon                           Z86.010, Personal history of colonic polyps                           K57.30, Diverticulosis of large intestine without                            perforation or abscess without bleeding CPT copyright 2019 American Medical Association. All rights reserved. The codes documented in this report are preliminary and upon coder review may  be revised to meet current compliance requirements. Elon Alas. Abbey Chatters, DO [place] Abbey Chatters, DO 03/25/2021 12:21:18 PM This report has been signed electronically. Number of Addenda: 0

## 2021-03-26 LAB — SURGICAL PATHOLOGY

## 2021-03-31 ENCOUNTER — Encounter (HOSPITAL_COMMUNITY): Payer: Self-pay | Admitting: Internal Medicine

## 2021-04-05 DIAGNOSIS — L818 Other specified disorders of pigmentation: Secondary | ICD-10-CM | POA: Diagnosis not present

## 2021-04-05 DIAGNOSIS — L218 Other seborrheic dermatitis: Secondary | ICD-10-CM | POA: Diagnosis not present

## 2021-04-05 DIAGNOSIS — L308 Other specified dermatitis: Secondary | ICD-10-CM | POA: Diagnosis not present

## 2021-04-05 DIAGNOSIS — L918 Other hypertrophic disorders of the skin: Secondary | ICD-10-CM | POA: Diagnosis not present

## 2021-04-16 IMAGING — CT CT ABD-PELV W/ CM
2 of 5 series · 16 of 46 positions shown, 18 images · IV contrast (Omnipaque or Isovue)
Comparison: CT abdomen pelvis 04/28/2016

CLINICAL DATA: Left lower abd pain x 6 days, nausea and dysuria.

EXAM:
CT ABDOMEN AND PELVIS WITH CONTRAST
TECHNIQUE: Multidetector CT imaging of the abdomen and pelvis was performed
using the standard protocol following bolus administration of
intravenous contrast.
CONTRAST:  100mL OMNIPAQUE IOHEXOL 300 MG/ML  SOLN

[Series 2: axial st · axial · 0.81mm/px · z∈[+967,+1412]mm · 13 of 101 slices shown, 15 images]
[im 6/101  soft-tissue]
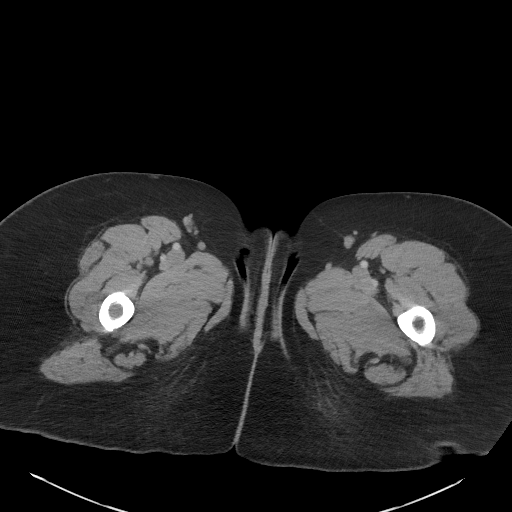
[im 6/101  bone]
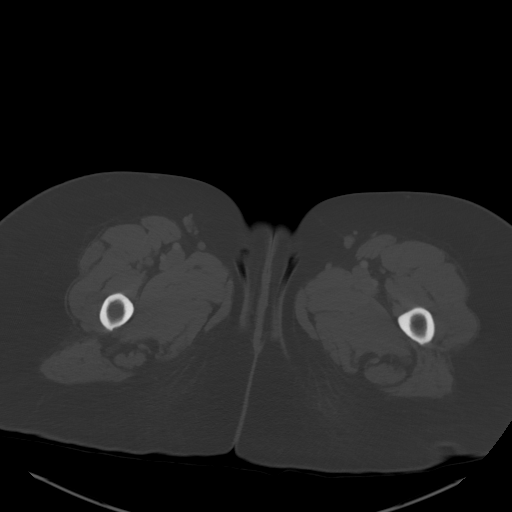
[im 12/101  soft-tissue]
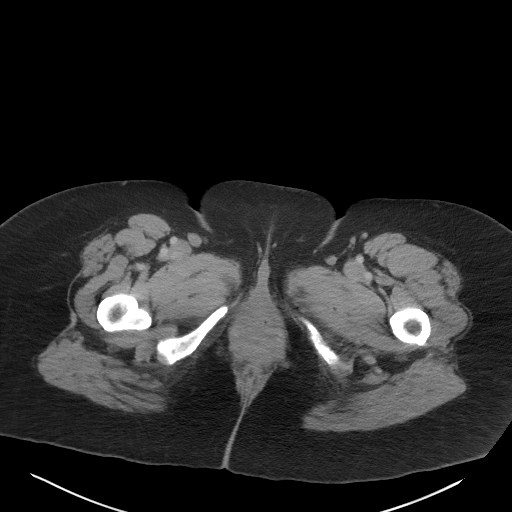
[im 24/101  soft-tissue]
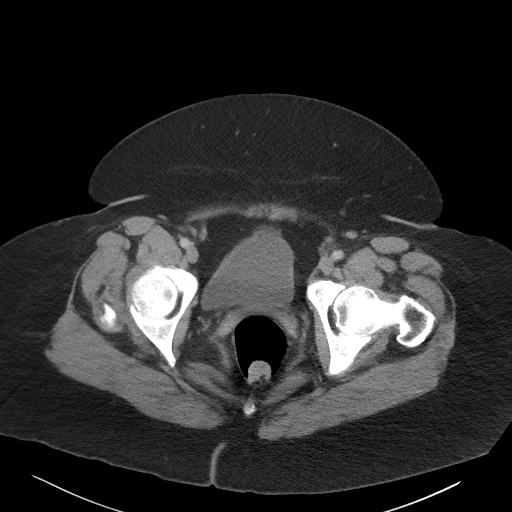
[im 30/101  soft-tissue]
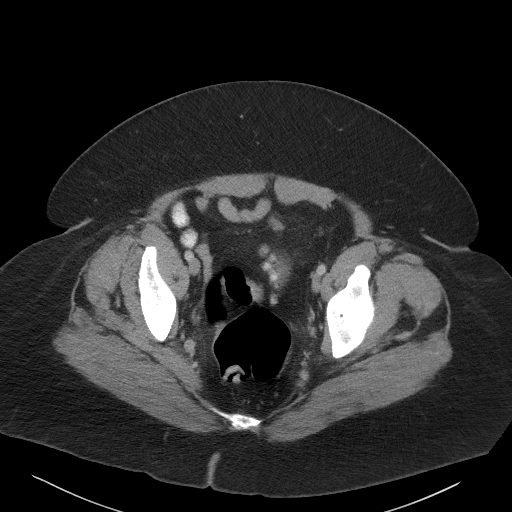
[im 36/101  soft-tissue]
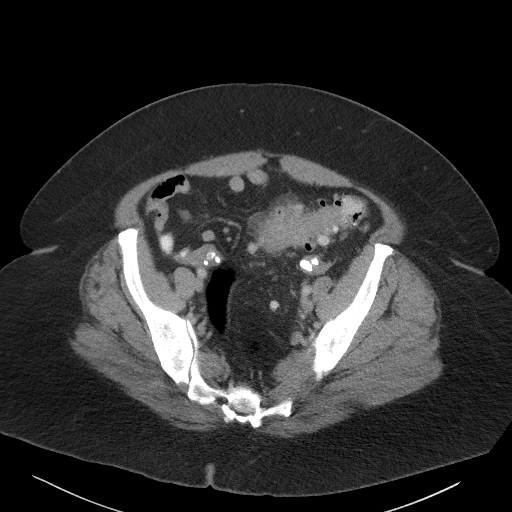
[im 42/101  soft-tissue]
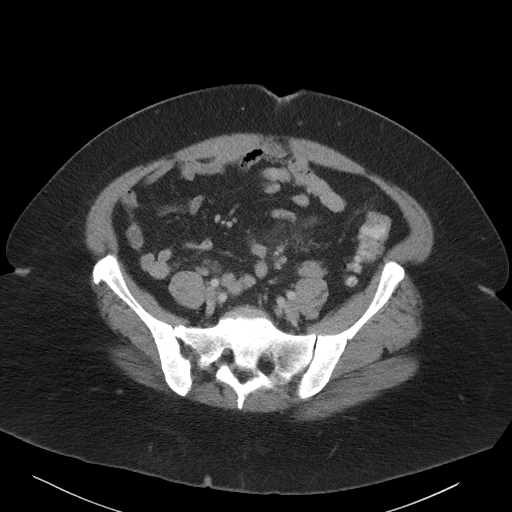
[im 53/101  soft-tissue]
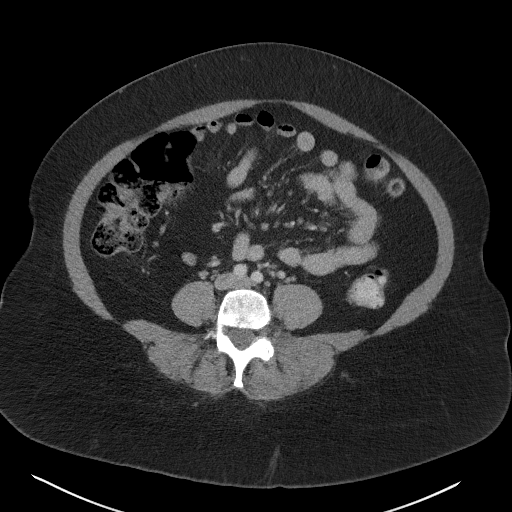
[im 59/101  soft-tissue]
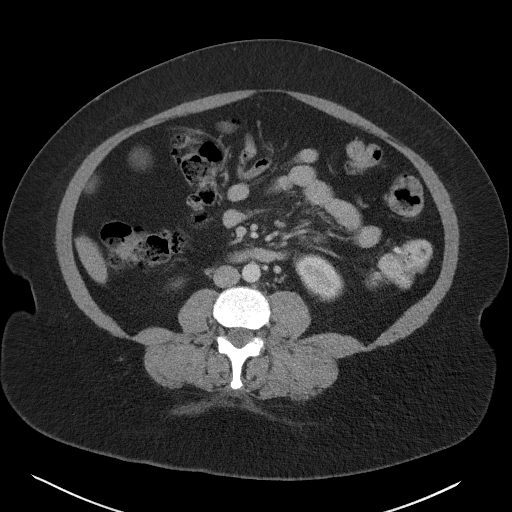
[im 65/101  soft-tissue]
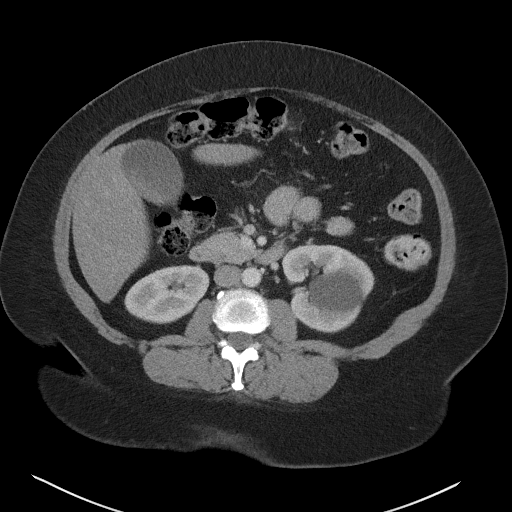
[im 65/101  bone]
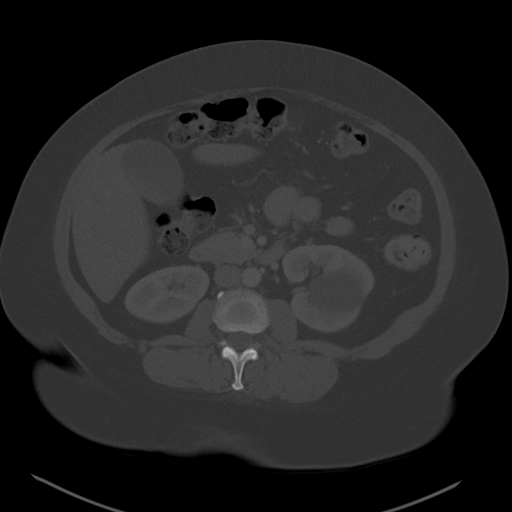
[im 71/101  soft-tissue]
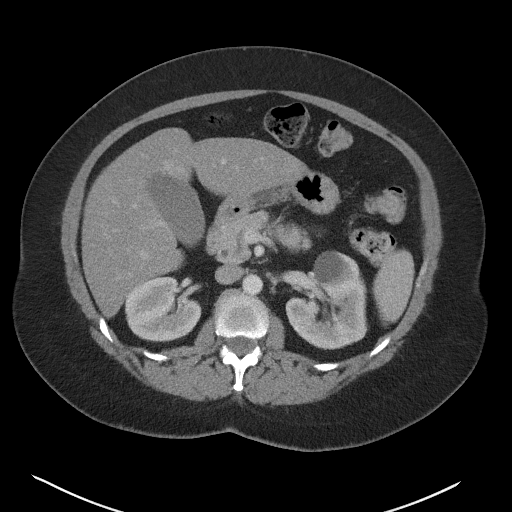
[im 77/101  soft-tissue]
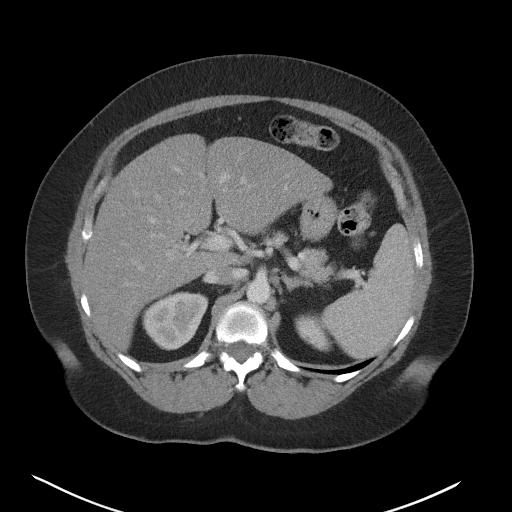
[im 89/101  soft-tissue]
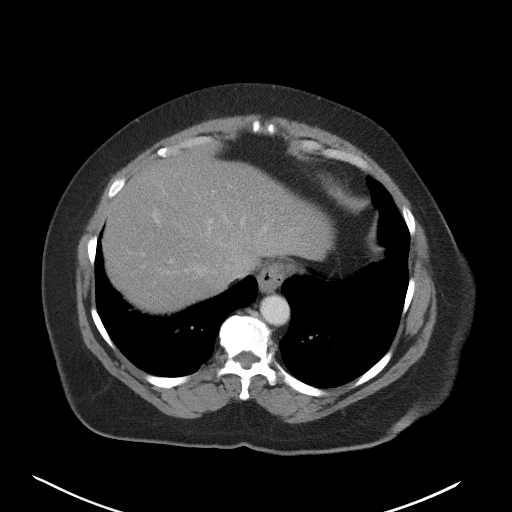
[im 95/101  soft-tissue]
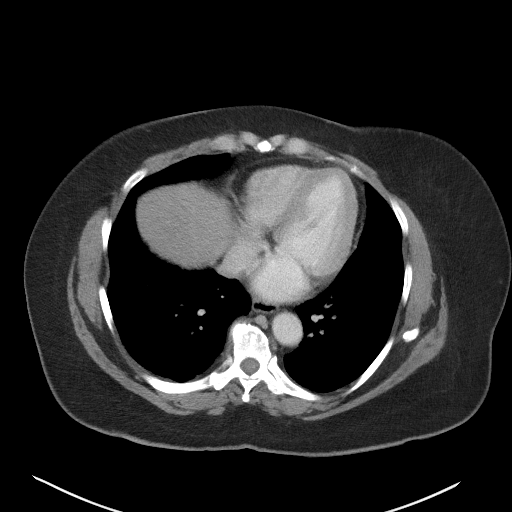

[Series 5: coronal st · coronal · 0.84mm/px · 3 of 128 slices shown]
[im 43/128  soft-tissue]
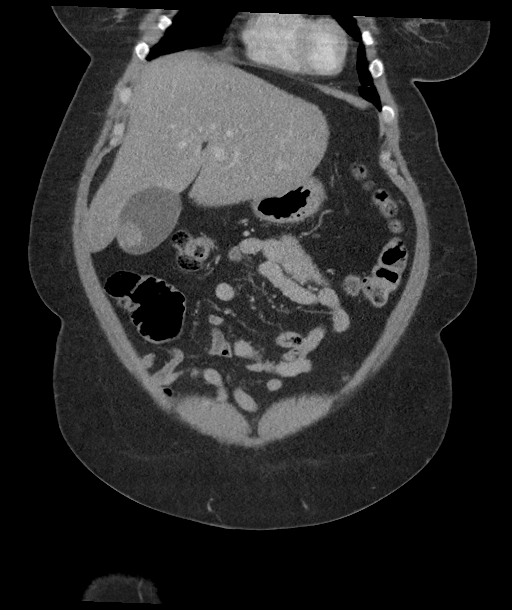
[im 57/128  soft-tissue]
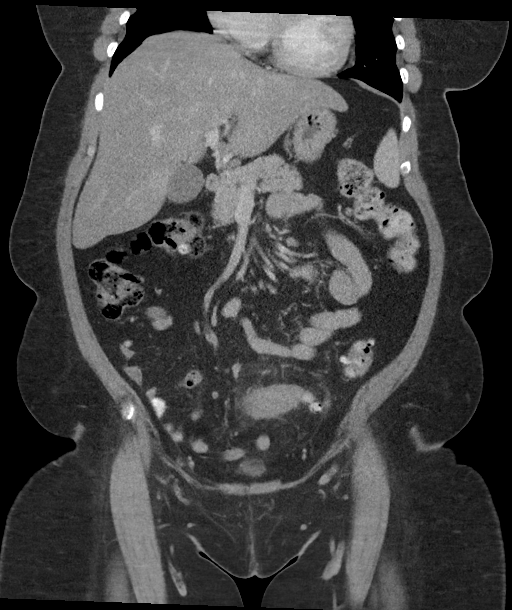
[im 71/128  soft-tissue]
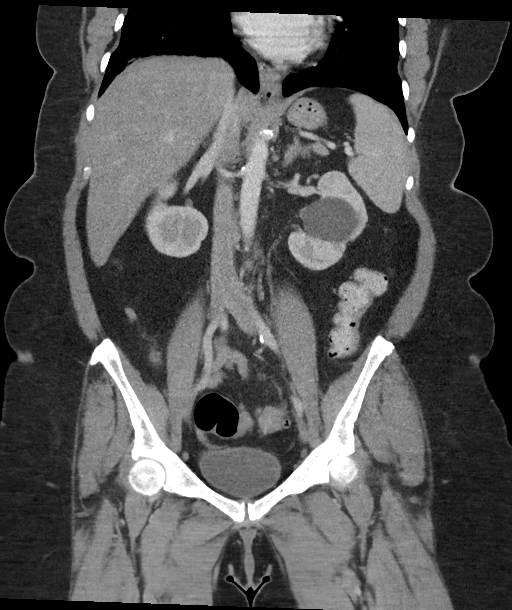

[16 of 46 positions shown; findings below may reference images not displayed]

FINDINGS: Lower chest: No acute abnormality.

Hepatobiliary: No focal liver abnormality is seen. No gallstones,
gallbladder wall thickening, or biliary dilatation. There is a
moderate amount of biliary sludge.

Pancreas: Unremarkable. No pancreatic ductal dilatation or
surrounding inflammatory changes.

Spleen: Normal in size without focal abnormality.

Adrenals/Urinary Tract: Adrenal glands are unremarkable. Bilateral
renal cysts. No hydronephrosis or renal calculi. Urinary bladder is
unremarkable.

Stomach/Bowel: Stomach is within normal limits. Multiple sigmoid
colonic diverticula with bowel wall thickening and surrounding
inflammatory stranding. No pericolonic fluid collection or free air.
No evidence of bowel obstruction. Normal appendix.

Vascular/Lymphatic: Aortic atherosclerosis. No enlarged abdominal or
pelvic lymph nodes.

Reproductive: Status post hysterectomy. No adnexal masses.

Other: No abdominal wall hernia or abnormality. No abdominopelvic
ascites.

Musculoskeletal: No acute or significant osseous findings.
IMPRESSION: 1. Acute uncomplicated sigmoid diverticulitis.
2. Moderate amount of biliary sludge.
3. Aortic atherosclerosis.

Aortic Atherosclerosis ([id number]).

## 2021-04-20 DIAGNOSIS — R030 Elevated blood-pressure reading, without diagnosis of hypertension: Secondary | ICD-10-CM | POA: Diagnosis not present

## 2021-04-20 DIAGNOSIS — R059 Cough, unspecified: Secondary | ICD-10-CM | POA: Diagnosis not present

## 2021-04-20 DIAGNOSIS — J209 Acute bronchitis, unspecified: Secondary | ICD-10-CM | POA: Diagnosis not present

## 2021-04-20 DIAGNOSIS — J4530 Mild persistent asthma, uncomplicated: Secondary | ICD-10-CM | POA: Diagnosis not present

## 2021-05-11 DIAGNOSIS — I10 Essential (primary) hypertension: Secondary | ICD-10-CM | POA: Diagnosis not present

## 2021-07-08 ENCOUNTER — Encounter: Payer: Self-pay | Admitting: Internal Medicine

## 2021-08-27 DIAGNOSIS — Z1231 Encounter for screening mammogram for malignant neoplasm of breast: Secondary | ICD-10-CM | POA: Diagnosis not present

## 2021-09-08 DIAGNOSIS — R350 Frequency of micturition: Secondary | ICD-10-CM | POA: Diagnosis not present

## 2021-09-08 DIAGNOSIS — Z87891 Personal history of nicotine dependence: Secondary | ICD-10-CM | POA: Diagnosis not present

## 2021-09-08 DIAGNOSIS — I10 Essential (primary) hypertension: Secondary | ICD-10-CM | POA: Diagnosis not present

## 2021-09-08 DIAGNOSIS — R634 Abnormal weight loss: Secondary | ICD-10-CM | POA: Diagnosis not present

## 2021-09-08 DIAGNOSIS — E7849 Other hyperlipidemia: Secondary | ICD-10-CM | POA: Diagnosis not present

## 2021-09-08 DIAGNOSIS — E782 Mixed hyperlipidemia: Secondary | ICD-10-CM | POA: Diagnosis not present

## 2021-09-08 DIAGNOSIS — Z0283 Encounter for blood-alcohol and blood-drug test: Secondary | ICD-10-CM | POA: Diagnosis not present

## 2021-09-08 DIAGNOSIS — D519 Vitamin B12 deficiency anemia, unspecified: Secondary | ICD-10-CM | POA: Diagnosis not present

## 2021-09-08 DIAGNOSIS — Z0001 Encounter for general adult medical examination with abnormal findings: Secondary | ICD-10-CM | POA: Diagnosis not present

## 2021-09-08 DIAGNOSIS — E559 Vitamin D deficiency, unspecified: Secondary | ICD-10-CM | POA: Diagnosis not present

## 2021-09-08 DIAGNOSIS — K581 Irritable bowel syndrome with constipation: Secondary | ICD-10-CM | POA: Diagnosis not present

## 2021-09-08 DIAGNOSIS — J4530 Mild persistent asthma, uncomplicated: Secondary | ICD-10-CM | POA: Diagnosis not present

## 2021-09-08 DIAGNOSIS — R81 Glycosuria: Secondary | ICD-10-CM | POA: Diagnosis not present

## 2021-09-08 DIAGNOSIS — R739 Hyperglycemia, unspecified: Secondary | ICD-10-CM | POA: Diagnosis not present

## 2021-09-13 DIAGNOSIS — E1165 Type 2 diabetes mellitus with hyperglycemia: Secondary | ICD-10-CM | POA: Diagnosis not present

## 2021-09-13 DIAGNOSIS — I10 Essential (primary) hypertension: Secondary | ICD-10-CM | POA: Diagnosis not present

## 2021-09-13 DIAGNOSIS — E559 Vitamin D deficiency, unspecified: Secondary | ICD-10-CM | POA: Diagnosis not present

## 2021-09-20 ENCOUNTER — Ambulatory Visit (INDEPENDENT_AMBULATORY_CARE_PROVIDER_SITE_OTHER): Payer: Medicare Other | Admitting: Gastroenterology

## 2021-09-20 ENCOUNTER — Encounter: Payer: Self-pay | Admitting: Gastroenterology

## 2021-09-20 VITALS — BP 155/88 | HR 100 | Temp 97.6°F | Ht 62.0 in | Wt 206.8 lb

## 2021-09-20 DIAGNOSIS — R634 Abnormal weight loss: Secondary | ICD-10-CM | POA: Diagnosis not present

## 2021-09-20 DIAGNOSIS — K219 Gastro-esophageal reflux disease without esophagitis: Secondary | ICD-10-CM | POA: Diagnosis not present

## 2021-09-20 DIAGNOSIS — R1310 Dysphagia, unspecified: Secondary | ICD-10-CM

## 2021-09-20 DIAGNOSIS — K581 Irritable bowel syndrome with constipation: Secondary | ICD-10-CM

## 2021-09-21 ENCOUNTER — Telehealth: Payer: Self-pay | Admitting: *Deleted

## 2021-09-21 DIAGNOSIS — R634 Abnormal weight loss: Secondary | ICD-10-CM

## 2021-09-21 NOTE — Telephone Encounter (Signed)
CT scheduled 6/29, arrival 8am. Npo 4 hrs prior, p/u oral prep, have labs done prior  Called pt and she is aware of appt details. She voiced understanding. Lab order placed.

## 2021-09-22 ENCOUNTER — Telehealth: Payer: Self-pay | Admitting: Gastroenterology

## 2021-09-22 ENCOUNTER — Other Ambulatory Visit (HOSPITAL_COMMUNITY)
Admission: RE | Admit: 2021-09-22 | Discharge: 2021-09-22 | Disposition: A | Discharge status: Home / Self Care | Payer: Medicare Other | Attending: Family Medicine | Admitting: Family Medicine

## 2021-09-22 DIAGNOSIS — R634 Abnormal weight loss: Secondary | ICD-10-CM | POA: Diagnosis not present

## 2021-09-22 LAB — BASIC METABOLIC PANEL
Anion gap: 10 (ref 5–15)
BUN: 17 mg/dL (ref 8–23)
CO2: 25 mmol/L (ref 22–32)
Calcium: 9.1 mg/dL (ref 8.9–10.3)
Chloride: 97 mmol/L — ABNORMAL LOW (ref 98–111)
Creatinine, Ser: 0.78 mg/dL (ref 0.44–1.00)
GFR, Estimated: >60 mL/min (ref >60)
Glucose, Bld: 280 mg/dL — ABNORMAL HIGH (ref 70–99)
Potassium: 3.5 mmol/L (ref 3.5–5.1)
Sodium: 132 mmol/L — ABNORMAL LOW (ref 135–145)

## 2021-09-22 NOTE — Telephone Encounter (Signed)
Received and reviewed labs from PCP dated 09/08/2021. CBC: WBC 4.9, hemoglobin 15.3, hematocrit 47.0 (H), MCV 92, MCH 30.0, MCHC 32.6, platelets 270. CMP: Glucose 399 (H), creatinine 0.76, sodium 136, potassium 4.5, chloride 99, calcium 9.6, total protein 6.6, albumin 4.2, total bilirubin 0.5, alk phos 137 (H), AST 13, ALT 14 TSH: 1.23 Hemoglobin A1c greater than 15.5 (H) Vitamin D, 25-hydroxy 13.9 (L) HIV antibody nonreactive  Please let patient know I received and reviewed her recent labs completed with PCP.  As she is aware, her diabetes is uncontrolled.  She also had mild elevation of her alkaline phosphatase which was previously normal in March 2022.  This could be secondary to diabetes versus liver causes.  We are waiting on her CT which scheduled for tomorrow.  This will help [place] take a closer look at her liver.  I also recommend checking GGT to help [place] determine if her elevated alkaline phosphatase may be coming from her liver.  Please arrange GGT. Dx: Elevated alk phos.

## 2021-09-23 ENCOUNTER — Ambulatory Visit (HOSPITAL_COMMUNITY)
Admission: RE | Admit: 2021-09-23 | Discharge: 2021-09-23 | Disposition: A | Discharge status: Home / Self Care | Payer: Medicare Other | Source: Ambulatory Visit | Attending: Gastroenterology | Admitting: Gastroenterology

## 2021-09-23 MED ORDER — BARIUM SULFATE 2 % PO SUSP
ORAL | Status: AC
  Filled 2021-09-23: qty 1

## 2021-09-23 NOTE — Telephone Encounter (Signed)
Lmom for pt to return my call.  

## 2021-09-24 NOTE — Telephone Encounter (Signed)
Lmom for return call.

## 2021-09-27 ENCOUNTER — Ambulatory Visit (HOSPITAL_COMMUNITY)
Admission: RE | Admit: 2021-09-27 | Discharge: 2021-09-27 | Disposition: A | Discharge status: Home / Self Care | Payer: Medicare Other | Source: Ambulatory Visit | Attending: Gastroenterology | Admitting: Gastroenterology

## 2021-09-27 DIAGNOSIS — K5730 Diverticulosis of large intestine without perforation or abscess without bleeding: Secondary | ICD-10-CM | POA: Diagnosis not present

## 2021-09-27 DIAGNOSIS — I700 Atherosclerosis of aorta: Secondary | ICD-10-CM | POA: Diagnosis not present

## 2021-09-27 DIAGNOSIS — R634 Abnormal weight loss: Secondary | ICD-10-CM | POA: Insufficient documentation

## 2021-09-27 MED ORDER — IOHEXOL 300 MG/ML  SOLN
100.0000 mL | Freq: Once | INTRAMUSCULAR | Status: AC | PRN
  Administered 2021-09-27: 100 mL via INTRAVENOUS

## 2021-09-27 NOTE — Telephone Encounter (Signed)
Lmom for pt to return call. 

## 2021-09-29 ENCOUNTER — Other Ambulatory Visit: Payer: Self-pay | Admitting: *Deleted

## 2021-09-29 DIAGNOSIS — R7989 Other specified abnormal findings of blood chemistry: Secondary | ICD-10-CM

## 2021-09-29 DIAGNOSIS — R748 Abnormal levels of other serum enzymes: Secondary | ICD-10-CM

## 2021-09-29 NOTE — Telephone Encounter (Signed)
Noted  

## 2021-09-29 NOTE — Telephone Encounter (Signed)
Spoke to pt, informed her of results and recommendations. Pt voiced understanding. Informed pt to contact PCP concerning her diabetes.

## 2021-09-29 NOTE — Telephone Encounter (Signed)
Informed pt to have labs completed. Labs entered into Epic.

## 2021-09-29 NOTE — Telephone Encounter (Signed)
Did you let patient know of additional blood work needed? I do not see any pending orders to be signed at this time.

## 2021-10-03 ENCOUNTER — Other Ambulatory Visit: Payer: Self-pay | Admitting: Gastroenterology

## 2021-10-03 DIAGNOSIS — K219 Gastro-esophageal reflux disease without esophagitis: Secondary | ICD-10-CM

## 2021-10-06 ENCOUNTER — Other Ambulatory Visit: Payer: Self-pay

## 2021-10-06 ENCOUNTER — Emergency Department (HOSPITAL_COMMUNITY)
Admission: EM | Admit: 2021-10-06 | Discharge: 2021-10-07 | Disposition: A | Discharge status: Home / Self Care | Payer: Medicare Other | Attending: Emergency Medicine | Admitting: Emergency Medicine

## 2021-10-06 ENCOUNTER — Telehealth: Payer: Self-pay | Admitting: Internal Medicine

## 2021-10-06 ENCOUNTER — Emergency Department (HOSPITAL_COMMUNITY): Payer: Medicare Other

## 2021-10-06 ENCOUNTER — Encounter (HOSPITAL_COMMUNITY): Payer: Self-pay

## 2021-10-06 ENCOUNTER — Other Ambulatory Visit: Payer: Self-pay | Admitting: Gastroenterology

## 2021-10-06 DIAGNOSIS — R197 Diarrhea, unspecified: Secondary | ICD-10-CM

## 2021-10-06 DIAGNOSIS — R748 Abnormal levels of other serum enzymes: Secondary | ICD-10-CM | POA: Diagnosis not present

## 2021-10-06 DIAGNOSIS — R1032 Left lower quadrant pain: Secondary | ICD-10-CM | POA: Diagnosis not present

## 2021-10-06 DIAGNOSIS — K5792 Diverticulitis of intestine, part unspecified, without perforation or abscess without bleeding: Secondary | ICD-10-CM | POA: Insufficient documentation

## 2021-10-06 DIAGNOSIS — K5732 Diverticulitis of large intestine without perforation or abscess without bleeding: Secondary | ICD-10-CM

## 2021-10-06 DIAGNOSIS — Z8719 Personal history of other diseases of the digestive system: Secondary | ICD-10-CM

## 2021-10-06 LAB — COMPREHENSIVE METABOLIC PANEL
ALT: 19 U/L (ref 0–44)
AST: 18 U/L (ref 15–41)
Albumin: 3.8 g/dL (ref 3.5–5.0)
Alkaline Phosphatase: 115 U/L (ref 38–126)
Anion gap: 9 (ref 5–15)
BUN: 11 mg/dL (ref 8–23)
CO2: 26 mmol/L (ref 22–32)
Calcium: 10.4 mg/dL — ABNORMAL HIGH (ref 8.9–10.3)
Chloride: 102 mmol/L (ref 98–111)
Creatinine, Ser: 0.74 mg/dL (ref 0.44–1.00)
GFR, Estimated: >60 mL/min (ref >60)
Glucose, Bld: 178 mg/dL — ABNORMAL HIGH (ref 70–99)
Potassium: 3.6 mmol/L (ref 3.5–5.1)
Sodium: 137 mmol/L (ref 135–145)
Total Bilirubin: 0.5 mg/dL (ref 0.3–1.2)
Total Protein: 7.8 g/dL (ref 6.5–8.1)

## 2021-10-06 LAB — CBC
HCT: 46.4 % — ABNORMAL HIGH (ref 36.0–46.0)
Hemoglobin: 15.4 g/dL — ABNORMAL HIGH (ref 12.0–15.0)
MCH: 30.1 pg (ref 26.0–34.0)
MCHC: 33.2 g/dL (ref 30.0–36.0)
MCV: 90.6 fL (ref 80.0–100.0)
Platelets: 386 10*3/uL (ref 150–400)
RBC: 5.12 MIL/uL — ABNORMAL HIGH (ref 3.87–5.11)
RDW: 12.7 % (ref 11.5–15.5)
WBC: 9.3 10*3/uL (ref 4.0–10.5)
nRBC: 0 % (ref 0.0–0.2)

## 2021-10-06 LAB — LIPASE, BLOOD: Lipase: 24 U/L (ref 11–51)

## 2021-10-06 MED ORDER — LACTATED RINGERS IV BOLUS
1000.0000 mL | Freq: Once | INTRAVENOUS | Status: AC
  Administered 2021-10-06: 1000 mL via INTRAVENOUS

## 2021-10-06 MED ORDER — HYDROMORPHONE HCL 1 MG/ML IJ SOLN
1.0000 mg | Freq: Once | INTRAMUSCULAR | Status: AC
  Administered 2021-10-06: 1 mg via INTRAVENOUS
  Filled 2021-10-06: qty 1

## 2021-10-06 MED ORDER — IOHEXOL 300 MG/ML  SOLN
100.0000 mL | Freq: Once | INTRAMUSCULAR | Status: AC | PRN
  Administered 2021-10-06: 100 mL via INTRAVENOUS

## 2021-10-06 MED ORDER — ONDANSETRON HCL 4 MG/2ML IJ SOLN
4.0000 mg | Freq: Once | INTRAMUSCULAR | Status: AC
  Administered 2021-10-06: 4 mg via INTRAVENOUS
  Filled 2021-10-06: qty 2

## 2021-10-06 NOTE — Addendum Note (Signed)
Addended by: Cheron Every on: 10/06/2021 03:40 PM   Modules accepted: Orders

## 2021-10-06 NOTE — Telephone Encounter (Signed)
Spoke with patient. She reports LLQ abdominal pain that has been constant, sharp, throbbing, and worsening since last week, feels similar to prior diverticulitis episodes. Also with intermittent diarrhea, not currently taking Linzess. Had diarrhea on Saturday and diarrhea today with 4 watery Bms this morning. No brbpr or melena. Felt warm and sweaty, but not sure if she had a fever. Some nausea without vomiting. Has been taking Amoxicillin since last Thursday for a tooth infection.   She has history of sigmoid diverticulitis last year which may be the culprit of her symptoms. May also have colitis/infectious diarrhea. Will update labs, obtain CT, and advised patient to complete stool studies if her diarrhea returns. Patient was advised to proceed to the emergency room if any worsening symptoms.   Mindy:  Please arrange CT A/P with contrast STAT. Dx: LLQ abdominal pain, diarrhea, history of diverticulitis

## 2021-10-06 NOTE — Telephone Encounter (Signed)
Patient needs her dexilant sent to [facility] in [place] and would like to speak to a nurse.  She is having diverticulitis symptoms.  Was seen 08/2021

## 2021-10-06 NOTE — Telephone Encounter (Signed)
Called pt x 3 no answer, radiology wanted to see if pt could go today but no answer from pt.  She is scheduled for 7/13, arrival 4:45pm, npo 4 hrs prior, p/u oral contrast today or in the AM

## 2021-10-06 NOTE — ED Notes (Signed)
Report given to Ivan Croft

## 2021-10-06 NOTE — ED Triage Notes (Signed)
Pt presents to ED from home with c/o LLQ pain that originally started on June 26th, pt went to PCP, lab work done, CT scan ordered and resulted negative per pt, pain has been intermittent since then but has gotten worse in the past 2 days.

## 2021-10-07 ENCOUNTER — Ambulatory Visit (HOSPITAL_COMMUNITY): Admission: RE | Admit: 2021-10-07 | Payer: Medicare Other | Source: Ambulatory Visit

## 2021-10-07 ENCOUNTER — Other Ambulatory Visit: Payer: Self-pay | Admitting: *Deleted

## 2021-10-07 DIAGNOSIS — R1032 Left lower quadrant pain: Secondary | ICD-10-CM | POA: Diagnosis not present

## 2021-10-07 LAB — URINALYSIS, ROUTINE W REFLEX MICROSCOPIC
Bilirubin Urine: NEGATIVE
Glucose, UA: NEGATIVE mg/dL
Hgb urine dipstick: NEGATIVE
Ketones, ur: NEGATIVE mg/dL
Leukocytes,Ua: NEGATIVE
Nitrite: NEGATIVE
Protein, ur: NEGATIVE mg/dL
Specific Gravity, Urine: 1.016 (ref 1.005–1.030)
pH: 6 (ref 5.0–8.0)

## 2021-10-07 LAB — CBC WITH DIFFERENTIAL/PLATELET
Absolute Monocytes: 511 cells/uL (ref 200–950)
Basophils Absolute: 37 cells/uL (ref 0–200)
Basophils Relative: 0.5 %
Eosinophils Absolute: 59 cells/uL (ref 15–500)
Eosinophils Relative: 0.8 %
HCT: 46.8 % — ABNORMAL HIGH (ref 35.0–45.0)
Hemoglobin: 15.1 g/dL (ref 11.7–15.5)
Lymphs Abs: 2309 cells/uL (ref 850–3900)
MCH: 30 pg (ref 27.0–33.0)
MCHC: 32.3 g/dL (ref 32.0–36.0)
MCV: 92.9 fL (ref 80.0–100.0)
MPV: 11.6 fL (ref 7.5–12.5)
Monocytes Relative: 6.9 %
Neutro Abs: 4484 cells/uL (ref 1500–7800)
Neutrophils Relative %: 60.6 %
Platelets: 350 10*3/uL (ref 140–400)
RBC: 5.04 10*6/uL (ref 3.80–5.10)
RDW: 12.7 % (ref 11.0–15.0)
Total Lymphocyte: 31.2 %
WBC: 7.4 10*3/uL (ref 3.8–10.8)

## 2021-10-07 LAB — BASIC METABOLIC PANEL
BUN: 11 mg/dL (ref 7–25)
CO2: 25 mmol/L (ref 20–32)
Calcium: 11.4 mg/dL — ABNORMAL HIGH (ref 8.6–10.4)
Chloride: 101 mmol/L (ref 98–110)
Creat: 0.68 mg/dL (ref 0.50–1.05)
Glucose, Bld: 195 mg/dL — ABNORMAL HIGH (ref 65–139)
Potassium: 4 mmol/L (ref 3.5–5.3)
Sodium: 139 mmol/L (ref 135–146)

## 2021-10-07 LAB — GAMMA GT: GGT: 63 U/L (ref 3–65)

## 2021-10-07 MED ORDER — AMOXICILLIN-POT CLAVULANATE 875-125 MG PO TABS: 1.0000 | ORAL_TABLET | Freq: Two times a day (BID) | ORAL | 0 refills | Status: DC

## 2021-10-07 MED ORDER — AMOXICILLIN-POT CLAVULANATE 875-125 MG PO TABS: 1.0000 | ORAL_TABLET | Freq: Three times a day (TID) | ORAL | 0 refills | Status: AC

## 2021-10-07 MED ORDER — OXYCODONE HCL 5 MG PO TABS: 5.0000 mg | ORAL_TABLET | ORAL | 0 refills | Status: DC | PRN

## 2021-10-07 MED ORDER — ONDANSETRON 4 MG PO TBDP: 4.0000 mg | ORAL_TABLET | Freq: Four times a day (QID) | ORAL | 0 refills | Status: AC | PRN

## 2021-10-07 MED ORDER — SODIUM CHLORIDE 0.9 % IV SOLN
3.0000 g | Freq: Four times a day (QID) | INTRAVENOUS | Status: DC
  Administered 2021-10-07: 3 g via INTRAVENOUS
  Filled 2021-10-07: qty 8

## 2021-10-07 NOTE — ED Provider Notes (Signed)
[facility] EMERGENCY DEPARTMENT Provider Note   CSN: [id number] Arrival date & time: 10/06/21  1937     History  Chief Complaint  Patient presents with   Abdominal Pain    LLQ pain since June 26th    Holly [name] is a 66 y.o. female.  66 year old female presents the ER today with left lower quad abdominal pain.  Patient states it feels like when she had diverticulitis in the past.  Been going on for couple weeks now she had a CT scan about a week ago that was normal.  She states the pain seems to be is progressively worsening.  Some nausea no vomiting.  No fevers.  No changes in bowel movements.   Abdominal Pain      Home Medications Prior to Admission medications   Medication Sig Start Date End Date Taking? Authorizing Provider  amoxicillin-clavulanate (AUGMENTIN) 875-125 MG tablet Take 1 tablet by mouth every 12 (twelve) hours. 10/07/21  Yes Tearsa Kowalewski, Corene Cornea, MD  ondansetron (ZOFRAN-ODT) 4 MG disintegrating tablet Take 1 tablet (4 mg total) by mouth every 6 (six) hours as needed for nausea or vomiting. 10/07/21  Yes Koty Anctil, Corene Cornea, MD  oxyCODONE (ROXICODONE) 5 MG immediate release tablet Take 1 tablet (5 mg total) by mouth every 4 (four) hours as needed for severe pain. 10/07/21  Yes Aleisha Paone, Corene Cornea, MD  acetaminophen (TYLENOL) 500 MG tablet Take 1,000 mg by mouth every 6 (six) hours as needed for mild pain.    [provider]  albuterol (VENTOLIN HFA) 108 (90 Base) MCG/ACT inhaler Inhale 1-2 puffs into the lungs every 6 (six) hours as needed for wheezing or shortness of breath.    [provider]  atorvastatin (LIPITOR) 10 MG tablet SMARTSIG:1 Tablet(s) By Mouth Every Evening 09/13/21   [provider]  BREO ELLIPTA 100-25 MCG/INH AEPB Inhale 1 puff into the lungs daily. 02/27/20   [provider]  cyclobenzaprine (FLEXERIL) 10 MG tablet Take 10 mg by mouth at bedtime. 04/11/20   [provider]  dexlansoprazole (DEXILANT) 60 MG capsule Take  1 capsule by mouth once daily 10/04/21   Erenest Rasher, PA-C  hydrochlorothiazide (HYDRODIURIL) 25 MG tablet Take 25 mg by mouth every morning. 05/11/21   [provider]  linaclotide Rolan Lipa) 72 MCG capsule Take 1 capsule (72 mcg total) by mouth daily before breakfast. 03/25/21   Erenest Rasher, PA-C  metFORMIN (GLUCOPHAGE) 500 MG tablet Take 1,000 mg by mouth 2 (two) times daily. 09/09/21   [provider]  montelukast (SINGULAIR) 10 MG tablet Take 10 mg by mouth at bedtime.    [provider]  ondansetron (ZOFRAN) 4 MG tablet Take 1 tablet (4 mg total) by mouth every 8 (eight) hours as needed for nausea or vomiting. Patient not taking: Reported on 09/20/2021 06/19/20   Erenest Rasher, PA-C      Allergies    Codeine and Morphine and related    Review of Systems   Review of Systems  Gastrointestinal:  Positive for abdominal pain.    Physical Exam Updated Vital Signs BP 105/63   Pulse 87   Temp 97.6 F (36.4 C)   Resp 12   Ht '5\' 2"'$  (1.575 m)   Wt 92.5 kg   SpO2 92%   BMI 37.31 kg/m  Physical Exam Vitals and nursing note reviewed.  Constitutional:      Appearance: She is well-developed.  HENT:     Head: Normocephalic and atraumatic.     Mouth/Throat:  Mouth: Mucous membranes are moist.     Pharynx: Oropharynx is clear.  Eyes:     Pupils: Pupils are equal, round, and reactive to light.  Cardiovascular:     Rate and Rhythm: Normal rate and regular rhythm.  Pulmonary:     Effort: No respiratory distress.     Breath sounds: No stridor.  Abdominal:     General: There is no distension.     Tenderness: There is abdominal tenderness in the left lower quadrant.  Musculoskeletal:     Cervical back: Normal range of motion.  Neurological:     General: No focal deficit present.     Mental Status: She is alert.     ED Results / Procedures / Treatments   Labs (all labs ordered are listed, but only abnormal results are displayed) Labs  Reviewed  COMPREHENSIVE METABOLIC PANEL - Abnormal; Notable for the following components:      Result Value   Glucose, Bld 178 (*)    Calcium 10.4 (*)    All other components within normal limits  CBC - Abnormal; Notable for the following components:   RBC 5.12 (*)    Hemoglobin 15.4 (*)    HCT 46.4 (*)    All other components within normal limits  LIPASE, BLOOD  URINALYSIS, ROUTINE W REFLEX MICROSCOPIC    EKG None  Radiology CT ABDOMEN PELVIS W CONTRAST  Result Date: 10/07/2021 CLINICAL DATA:  Left lower quadrant abdominal pain EXAM: CT ABDOMEN AND PELVIS WITH CONTRAST TECHNIQUE: Multidetector CT imaging of the abdomen and pelvis was performed using the standard protocol following bolus administration of intravenous contrast. RADIATION DOSE REDUCTION: This exam was performed according to the departmental dose-optimization program which includes automated exposure control, adjustment of the mA and/or kV according to patient size and/or use of iterative reconstruction technique. CONTRAST:  126m OMNIPAQUE IOHEXOL 300 MG/ML  SOLN COMPARISON:  09/27/2021 FINDINGS: Lower chest: Lung bases are clear. Hepatobiliary: Mildly nodular hepatic contour. Layering gallstones, without associated inflammatory changes. No intrahepatic or extrahepatic ductal dilatation. Pancreas: Within normal limits. Spleen: Normal limits. Adrenals/Urinary Tract: Adrenal glands are within normal limits. Bilateral renal cysts, measuring up to 5.0 cm in the left interpolar region, benign (Bosniak I). No hydronephrosis. Bladder is underdistended but unremarkable. Stomach/Bowel: Stomach is within normal limits. No evidence of bowel obstruction. Normal appendix (series 2/image 60). Left colonic diverticulosis, with very mild pericolonic inflammatory changes (series 2/image 68), suggesting mild sigmoid diverticulitis. No drainable fluid collection/abscess. No free air to suggest macroscopic perforation. Vascular/Lymphatic: No evidence  of abdominal aortic aneurysm. Atherosclerotic calcifications of the abdominal aorta and branch vessels. No suspicious abdominopelvic lymphadenopathy. Reproductive: Status post hysterectomy. Bilateral ovaries are within normal limits. Other: No abdominopelvic ascites. Musculoskeletal: Degenerative changes of the lower thoracic spine. IMPRESSION: Mild sigmoid diverticulitis. No drainable fluid collection/abscess. No free air to suggest macroscopic perforation. Cholelithiasis, without associated inflammatory changes. Electronically Signed   By: SJulian HyM.D.   On: 10/07/2021 00:13    Procedures Procedures    Medications Ordered in ED Medications  Ampicillin-Sulbactam (UNASYN) 3 g in sodium chloride 0.9 % 100 mL IVPB (0 g Intravenous Stopped 10/07/21 0137)  HYDROmorphone (DILAUDID) injection 1 mg (1 mg Intravenous Given 10/06/21 2342)  ondansetron (ZOFRAN) injection 4 mg (4 mg Intravenous Given 10/06/21 2342)  lactated ringers bolus 1,000 mL (0 mLs Intravenous Stopped 10/07/21 0103)  iohexol (OMNIPAQUE) 300 MG/ML solution 100 mL (100 mLs Intravenous Contrast Given 10/06/21 2359)    ED Course/ Medical Decision Making/ A&P  Medical Decision Making Amount and/or Complexity of Data Reviewed Labs: ordered. Radiology: ordered.  Risk Prescription drug management.   Uncomplicated diverticulitis. Abx started. Pain controlled. Tolerating PO. Will d/c to fu w/ pcp  CT AP done and showed diverticulitis without free air or abscess (independently viewed and interpreted by myself and radiology read reviewed).    Final Clinical Impression(s) / ED Diagnoses Final diagnoses:  Diverticulitis    Rx / DC Orders ED Discharge Orders          Ordered    amoxicillin-clavulanate (AUGMENTIN) 875-125 MG tablet  Every 12 hours        10/07/21 0331    oxyCODONE (ROXICODONE) 5 MG immediate release tablet  Every 4 hours PRN        10/07/21 0331    ondansetron (ZOFRAN-ODT) 4 MG  disintegrating tablet  Every 6 hours PRN        10/07/21 0331              Kelleigh Skerritt, Corene Cornea, MD 10/07/21 [phone]

## 2021-10-07 NOTE — ED Notes (Signed)
While pt was sleeping O2 sat at 88%. Placed pt on 2L 02 for comfort. O2 sat now 96%

## 2021-10-07 NOTE — Telephone Encounter (Signed)
Order cancelled

## 2021-10-07 NOTE — Telephone Encounter (Signed)
Mindy, we can cancel patient's CT. She ended up going to the ED yesterday and had CT completed there.

## 2021-10-07 NOTE — Addendum Note (Signed)
Addended by: Aliene Altes on: 10/07/2021 07:43 AM   Modules accepted: Orders

## 2021-10-07 NOTE — Addendum Note (Signed)
Addended by: Cheron Every on: 10/07/2021 07:46 AM   Modules accepted: Orders

## 2021-10-13 DIAGNOSIS — I10 Essential (primary) hypertension: Secondary | ICD-10-CM | POA: Diagnosis not present

## 2021-10-13 DIAGNOSIS — E1165 Type 2 diabetes mellitus with hyperglycemia: Secondary | ICD-10-CM | POA: Diagnosis not present

## 2021-10-13 DIAGNOSIS — K5792 Diverticulitis of intestine, part unspecified, without perforation or abscess without bleeding: Secondary | ICD-10-CM | POA: Diagnosis not present

## 2021-10-13 DIAGNOSIS — T383X5A Adverse effect of insulin and oral hypoglycemic [antidiabetic] drugs, initial encounter: Secondary | ICD-10-CM | POA: Diagnosis not present

## 2021-10-16 NOTE — Progress Notes (Deleted)
Referring Provider: Caryl Bis, MD Primary Care Physician:  Caryl Bis, MD Primary GI Physician: Dr. Abbey Chatters  No chief complaint on file.   HPI:   Holly [name] is a 66 y.o. female with history of GERD, IBS-C, adenomatous colon polyps, uncomplicated sigmoid diverticulitis in March 2022 with follow-up colonoscopy completed in December 2022 due for surveillance in 2032, presenting today for an acute visit for follow-up of diverticulitis.   I just saw patient on 09/20/21. Chronic GERD was well controlled on Dexilant 60 mg daily and IBS-C controlled on Linzess 72 mcg daily.  Primary focus was on dysphagia and unintentional weight loss.  She has several month history of solid food and pill dysphagia.  Also a 34 pound weight loss over the last 6 months which has largely been unintentional.  She had started exercising about 30 minutes every other day 1 month prior.  She does been diagnosed with diabetes 2 weeks ago and started on metformin and also changed her diet at that time, but no dietary changes prior to this.  Recommended EGD, the patient requested to hold off due to uncontrolled diabetes.  She was scheduled for CT to evaluate her unintentional weight loss which did not reveal any significant abnormalities.  I received labs from PCP dated 09/08/2021.  Her hemoglobin A1c was greater than 15.5, vitamin D low at 13.9, TSH normal, alk phos elevated at 137, otherwise no significant abnormalities.  Recommended checking GGT which was normal.  Patient called 10/06/2021 reporting LLQ abdominal pain, intermittent diarrhea, nausea without vomiting.  She was taking amoxicillin for tooth infection.  Patient felt symptoms were similar to prior diverticulitis episodes.  Recommended CT, but patient presented to the emergency room later the same day.  CT A/P with contrast revealed mild uncomplicated sigmoid diverticulitis.  WBC normal.  Calcium slightly elevated at 10.4.  LFTs normal.  She was prescribed  Augmentin.   Today:      Past Medical History:  Diagnosis Date   Arthritis    knees   Constipation    chronic   Diverticulitis 2006   2015, 2019, March 2022   Diverticulosis    Enteritis    Fatty liver disease, nonalcoholic OCT 1610 U/S [place]   DEC 2016 NL [facility] [place]   Gallstone OCT 2016 [facility]   GERD (gastroesophageal reflux disease)    Hay fever    Headache(784.0)    Sinus problem     Past Surgical History:  Procedure Laterality Date   ABDOMINAL HYSTERECTOMY  2003   partial   CATARACT EXTRACTION Bilateral    COLONOSCOPY  02/2011   Dr. Doristine Mango: PROPOFOL. scattered diverticula, sessile polyp descending colon, path unavailable. recommenced five year follow up colonoscopy   COLONOSCOPY N/A 04/06/2015   Surgeon: Danie Binder, MD;  moderate diverticulosis throughout the entire colon, 1 hyperplastic polyp, 1 tubular adenoma, and moderate sized internal hemorrhoids. Recommended repeat in 5-10 years.    COLONOSCOPY WITH PROPOFOL N/A 03/25/2021   Surgeon: Eloise Harman, DO;   nonbleeding internal hemorrhoids, multiple small and large mouth diverticula in the sigmoid colon, 2 sessile polyps 3-5 mm in size in the transverse colon s/p resection and retrieval, 1 polyp not retrieved.  Pathology showed inflammatory polyp.  Recommended 10-year screening colonoscopy.   FOOT SURGERY     bilateral -bone removal   KNEE CLOSED REDUCTION  04/02/2012   Procedure: CLOSED MANIPULATION KNEE;  Surgeon: Gearlean Alf, MD;  Location: [facility];  Service: Orthopedics;  Laterality: Left;   POLYPECTOMY  03/25/2021   Procedure: POLYPECTOMY;  Surgeon: Eloise Harman, DO;  Location: [facility];  Service: Endoscopy;;   TONSILLECTOMY     age 89   TOTAL KNEE ARTHROPLASTY  01/30/2012   Procedure: TOTAL KNEE ARTHROPLASTY;  Surgeon: Gearlean Alf, MD;  Location: [facility];  Service: Orthopedics;  Laterality: Left;    Current Outpatient Medications  Medication Sig Dispense Refill    acetaminophen (TYLENOL) 500 MG tablet Take 1,000 mg by mouth every 6 (six) hours as needed for mild pain.     albuterol (VENTOLIN HFA) 108 (90 Base) MCG/ACT inhaler Inhale 1-2 puffs into the lungs every 6 (six) hours as needed for wheezing or shortness of breath.     amoxicillin-clavulanate (AUGMENTIN) 875-125 MG tablet Take 1 tablet by mouth 3 (three) times daily for 10 days. 30 tablet 0   atorvastatin (LIPITOR) 10 MG tablet SMARTSIG:1 Tablet(s) By Mouth Every Evening     BREO ELLIPTA 100-25 MCG/INH AEPB Inhale 1 puff into the lungs daily.     cyclobenzaprine (FLEXERIL) 10 MG tablet Take 10 mg by mouth at bedtime.     dexlansoprazole (DEXILANT) 60 MG capsule Take 1 capsule by mouth once daily 90 capsule 1   hydrochlorothiazide (HYDRODIURIL) 25 MG tablet Take 25 mg by mouth every morning.     linaclotide (LINZESS) 72 MCG capsule Take 1 capsule (72 mcg total) by mouth daily before breakfast. 90 capsule 1   metFORMIN (GLUCOPHAGE) 500 MG tablet Take 1,000 mg by mouth 2 (two) times daily.     montelukast (SINGULAIR) 10 MG tablet Take 10 mg by mouth at bedtime.     ondansetron (ZOFRAN) 4 MG tablet Take 1 tablet (4 mg total) by mouth every 8 (eight) hours as needed for nausea or vomiting. (Patient not taking: Reported on 09/20/2021) 20 tablet 0   ondansetron (ZOFRAN-ODT) 4 MG disintegrating tablet Take 1 tablet (4 mg total) by mouth every 6 (six) hours as needed for nausea or vomiting. 30 tablet 0   oxyCODONE (ROXICODONE) 5 MG immediate release tablet Take 1 tablet (5 mg total) by mouth every 4 (four) hours as needed for severe pain. 15 tablet 0   No current facility-administered medications for this visit.    Allergies as of 10/18/2021 - Review Complete 10/06/2021  Allergen Reaction Noted   Codeine Nausea And Vomiting 01/20/2012   Morphine and related Nausea And Vomiting 01/20/2012    Family History  Problem Relation Age of Onset   Liver cancer Sister        deceased   Colon cancer Neg Hx      Social History   Socioeconomic History   Marital status: Single    Spouse name: Not on file   Number of children: 3   Years of education: Not on file   Highest education level: Not on file  Occupational History   Occupation: out of work  Tobacco Use   Smoking status: Former    Packs/day: 0.50    Years: 40.00    Total pack years: 20.00    Types: Cigarettes    Quit date: 01/28/2012    Years since quitting: 9.7   Smokeless tobacco: Never  Vaping Use   Vaping Use: Never used  Substance and Sexual Activity   Alcohol use: No   Drug use: No   Sexual activity: Not Currently  Other Topics Concern   Not on file  Social History Narrative   Not on file  Social Determinants of Health   Financial Resource Strain: Not on file  Food Insecurity: Not on file  Transportation Needs: Not on file  Physical Activity: Not on file  Stress: Not on file  Social Connections: Not on file    Review of Systems: Gen: Denies fever, chills, cold or flulike symptoms, presyncope, syncope. CV: Denies chest pain, palpitations. Resp: Denies dyspnea, cough.  GI: See HPI Heme: See HPI  Physical Exam: There were no vitals taken for this visit. General:   Alert and oriented. No distress noted. Pleasant and cooperative.  Head:  Normocephalic and atraumatic. Eyes:  Conjuctiva clear without scleral icterus. Heart:  S1, S2 present without murmurs appreciated. Lungs:  Clear to auscultation bilaterally. No wheezes, rales, or rhonchi. No distress.  Abdomen:  +BS, soft, non-tender and non-distended. No rebound or guarding. No HSM or masses noted. Msk:  Symmetrical without gross deformities. Normal posture. Extremities:  Without edema. Neurologic:  Alert and  oriented x4 Psych:  Normal mood and affect.    Assessment:     Plan:  ***   Aliene Altes, PA-C [facility] 10/18/2021

## 2021-10-18 ENCOUNTER — Ambulatory Visit: Payer: Medicare Other | Admitting: Gastroenterology

## 2021-12-03 DIAGNOSIS — E782 Mixed hyperlipidemia: Secondary | ICD-10-CM | POA: Diagnosis not present

## 2021-12-03 DIAGNOSIS — R945 Abnormal results of liver function studies: Secondary | ICD-10-CM | POA: Diagnosis not present

## 2021-12-03 DIAGNOSIS — E7849 Other hyperlipidemia: Secondary | ICD-10-CM | POA: Diagnosis not present

## 2021-12-03 DIAGNOSIS — R739 Hyperglycemia, unspecified: Secondary | ICD-10-CM | POA: Diagnosis not present

## 2021-12-03 DIAGNOSIS — E1165 Type 2 diabetes mellitus with hyperglycemia: Secondary | ICD-10-CM | POA: Diagnosis not present

## 2021-12-03 DIAGNOSIS — E7800 Pure hypercholesterolemia, unspecified: Secondary | ICD-10-CM | POA: Diagnosis not present

## 2021-12-08 DIAGNOSIS — E559 Vitamin D deficiency, unspecified: Secondary | ICD-10-CM | POA: Diagnosis not present

## 2021-12-08 DIAGNOSIS — Z23 Encounter for immunization: Secondary | ICD-10-CM | POA: Diagnosis not present

## 2021-12-08 DIAGNOSIS — L918 Other hypertrophic disorders of the skin: Secondary | ICD-10-CM | POA: Diagnosis not present

## 2021-12-08 DIAGNOSIS — I10 Essential (primary) hypertension: Secondary | ICD-10-CM | POA: Diagnosis not present

## 2021-12-08 DIAGNOSIS — E1165 Type 2 diabetes mellitus with hyperglycemia: Secondary | ICD-10-CM | POA: Diagnosis not present

## 2021-12-12 ENCOUNTER — Encounter: Payer: Self-pay | Admitting: Gastroenterology

## 2021-12-12 NOTE — Progress Notes (Unsigned)
Referring Provider: Richardean Chimera, MD Primary Care Physician:  Richardean Chimera, MD Primary GI Physician: Dr. Marletta Lor  No chief complaint on file.   HPI:   Holly [name] is a 66 y.o. female with history of GERD, IBS-C, adenomatous colon polyps, history of recurrent sigmoid diverticulitis with most recent colonoscopy completed in December 2022 due for surveillance in 2032, presenting today for follow-up***  She was last seen in our office 09/20/21. Chronic GERD was well controlled on Dexilant 60 mg daily and IBS-C controlled on Linzess 72 mcg daily.  Primary focus was on dysphagia and unintentional weight loss.  She had several month history of solid food and pill dysphagia.  Also a 34 pound weight loss over the last 6 months which had largely been unintentional. She had started exercising about 30 minutes every other day 1 month prior.  She had also been diagnosed with diabetes 2 weeks ago and started on metformin and also changed her diet at that time, but no dietary changes prior to this.  Recommended EGD, but she requested  to hold off due to uncontrolled diabetes.  She was scheduled for CT to evaluate her unintentional weight loss which did not reveal any significant abnormalities.  I received labs from PCP dated 09/08/2021.  Her hemoglobin A1c was greater than 15.5, vitamin D low at 13.9, TSH normal, alk phos elevated at 137, otherwise no significant abnormalities.  Recommended checking GGT which was normal.  Patient called 10/06/2021 reporting LLQ abdominal pain, intermittent diarrhea, nausea without vomiting.  She was taking amoxicillin for tooth infection.  Patient felt symptoms were similar to prior diverticulitis episodes.  Recommended CT, but patient presented to the emergency room later the same day.  CT A/P with contrast revealed mild uncomplicated sigmoid diverticulitis.  WBC normal.  Calcium slightly elevated at 10.4.  LFTs normal.  She was prescribed Augmentin.  Today:  Hx of  diverticulitis:   Dysphagia:   GERD:   IBS-C:   Weight loss:   Past Medical History:  Diagnosis Date   Arthritis    knees   Constipation    chronic   Diverticulitis 2006   2015, 2019, March 2022   Diverticulosis    Enteritis    Fatty liver disease, nonalcoholic OCT 2016 [facility]   DEC 2016 NL [facility]   Gallstone OCT 2016 [facility]   GERD (gastroesophageal reflux disease)    Hay fever    Headache(784.0)    Sinus problem     Past Surgical History:  Procedure Laterality Date   ABDOMINAL HYSTERECTOMY  2003   partial   CATARACT EXTRACTION Bilateral    COLONOSCOPY  02/2011   Dr. Jones Skene: PROPOFOL. scattered diverticula, sessile polyp descending colon, path unavailable. recommenced five year follow up colonoscopy   COLONOSCOPY N/A 04/06/2015   Surgeon: West Bali, MD;  moderate diverticulosis throughout the entire colon, 1 hyperplastic polyp, 1 tubular adenoma, and moderate sized internal hemorrhoids. Recommended repeat in 5-10 years.    COLONOSCOPY WITH PROPOFOL N/A 03/25/2021   Surgeon: Lanelle Bal, DO;   nonbleeding internal hemorrhoids, multiple small and large mouth diverticula in the sigmoid colon, 2 sessile polyps 3-5 mm in size in the transverse colon s/p resection and retrieval, 1 polyp not retrieved.  Pathology showed inflammatory polyp.  Recommended 10-year screening colonoscopy.   FOOT SURGERY     bilateral -bone removal   KNEE CLOSED REDUCTION  04/02/2012   Procedure: CLOSED MANIPULATION KNEE;  Surgeon: Gus Rankin  Aluisio, MD;  Location: [facility];  Service: Orthopedics;  Laterality: Left;   POLYPECTOMY  03/25/2021   Procedure: POLYPECTOMY;  Surgeon: Eloise Harman, DO;  [facility];  Service: Endoscopy;;   TONSILLECTOMY     age 39   TOTAL KNEE ARTHROPLASTY  01/30/2012   Procedure: TOTAL KNEE ARTHROPLASTY;  Surgeon: Gearlean Alf, MD;  Location: [facility];  Service: Orthopedics;  Laterality: Left;    Current Outpatient Medications   Medication Sig Dispense Refill   acetaminophen (TYLENOL) 500 MG tablet Take 1,000 mg by mouth every 6 (six) hours as needed for mild pain.     albuterol (VENTOLIN HFA) 108 (90 Base) MCG/ACT inhaler Inhale 1-2 puffs into the lungs every 6 (six) hours as needed for wheezing or shortness of breath.     atorvastatin (LIPITOR) 10 MG tablet SMARTSIG:1 Tablet(s) By Mouth Every Evening     BREO ELLIPTA 100-25 MCG/INH AEPB Inhale 1 puff into the lungs daily.     cyclobenzaprine (FLEXERIL) 10 MG tablet Take 10 mg by mouth at bedtime.     dexlansoprazole (DEXILANT) 60 MG capsule Take 1 capsule by mouth once daily 90 capsule 1   hydrochlorothiazide (HYDRODIURIL) 25 MG tablet Take 25 mg by mouth every morning.     linaclotide (LINZESS) 72 MCG capsule Take 1 capsule (72 mcg total) by mouth daily before breakfast. 90 capsule 1   metFORMIN (GLUCOPHAGE) 500 MG tablet Take 1,000 mg by mouth 2 (two) times daily.     montelukast (SINGULAIR) 10 MG tablet Take 10 mg by mouth at bedtime.     ondansetron (ZOFRAN) 4 MG tablet Take 1 tablet (4 mg total) by mouth every 8 (eight) hours as needed for nausea or vomiting. (Patient not taking: Reported on 09/20/2021) 20 tablet 0   ondansetron (ZOFRAN-ODT) 4 MG disintegrating tablet Take 1 tablet (4 mg total) by mouth every 6 (six) hours as needed for nausea or vomiting. 30 tablet 0   oxyCODONE (ROXICODONE) 5 MG immediate release tablet Take 1 tablet (5 mg total) by mouth every 4 (four) hours as needed for severe pain. 15 tablet 0   No current facility-administered medications for this visit.    Allergies as of 12/13/2021 - Review Complete 10/06/2021  Allergen Reaction Noted   Codeine Nausea And Vomiting 01/20/2012   Morphine and related Nausea And Vomiting 01/20/2012    Family History  Problem Relation Age of Onset   Liver cancer Sister        deceased   Colon cancer Neg Hx     Social History   Socioeconomic History   Marital status: Single    Spouse name: Not  on file   Number of children: 3   Years of education: Not on file   Highest education level: Not on file  Occupational History   Occupation: out of work  Tobacco Use   Smoking status: Former    Packs/day: 0.50    Years: 40.00    Total pack years: 20.00    Types: Cigarettes    Quit date: 01/28/2012    Years since quitting: 9.8   Smokeless tobacco: Never  Vaping Use   Vaping Use: Never used  Substance and Sexual Activity   Alcohol use: No   Drug use: No   Sexual activity: Not Currently  Other Topics Concern   Not on file  Social History Narrative   Not on file   Social Determinants of Health   Financial Resource Strain: Not on file  Food Insecurity: Not on file  Transportation Needs: Not on file  Physical Activity: Not on file  Stress: Not on file  Social Connections: Not on file    Review of Systems: Gen: Denies fever, chills, cold or flu like symptoms, pre-syncope, or syncope.  CV: Denies chest pain, palpitations. Resp: Denies dyspnea, cough.  GI: See HPI Heme: See HPI  Physical Exam: There were no vitals taken for this visit. General:   Alert and oriented. No distress noted. Pleasant and cooperative.  Head:  Normocephalic and atraumatic. Eyes:  Conjuctiva clear without scleral icterus. Heart:  S1, S2 present without murmurs appreciated. Lungs:  Clear to auscultation bilaterally. No wheezes, rales, or rhonchi. No distress.  Abdomen:  +BS, soft, non-tender and non-distended. No rebound or guarding. No HSM or masses noted. Msk:  Symmetrical without gross deformities. Normal posture. Extremities:  Without edema. Neurologic:  Alert and  oriented x4 Psych:  Normal mood and affect.    Assessment:     Plan:  ***   Aliene Altes, PA-C [facility] 12/13/2021

## 2021-12-13 ENCOUNTER — Ambulatory Visit: Payer: Medicare Other | Admitting: Gastroenterology

## 2021-12-13 ENCOUNTER — Encounter: Payer: Self-pay | Admitting: Gastroenterology

## 2021-12-13 VITALS — BP 136/83 | HR 109 | Temp 98.1°F | Ht 65.0 in | Wt 202.8 lb

## 2021-12-13 DIAGNOSIS — Z8719 Personal history of other diseases of the digestive system: Secondary | ICD-10-CM | POA: Diagnosis not present

## 2021-12-13 DIAGNOSIS — K581 Irritable bowel syndrome with constipation: Secondary | ICD-10-CM

## 2021-12-13 DIAGNOSIS — R1310 Dysphagia, unspecified: Secondary | ICD-10-CM

## 2021-12-13 DIAGNOSIS — K219 Gastro-esophageal reflux disease without esophagitis: Secondary | ICD-10-CM | POA: Diagnosis not present

## 2021-12-13 DIAGNOSIS — R634 Abnormal weight loss: Secondary | ICD-10-CM | POA: Diagnosis not present

## 2021-12-13 NOTE — Patient Instructions (Signed)
Continue Dexilant 60 mg daily.  Start Pepcid 20 mg at bedtime to see if this will help prevent morning reflux.  Follow a GERD diet:  Avoid fried, fatty, greasy, spicy, citrus foods. Avoid caffeine and carbonated beverages. Avoid chocolate. Try eating 4-6 small meals a day rather than 3 large meals. Do not eat within 3 hours of laying down. Prop head of bed up on wood or bricks to create a 6 inch incline.  Continue taking to Linzess 72 mcg every other day as this is working well for you.  Please let me know when you get close to the end of your prescription and I will send in a new prescription for Linzess 145 mcg.  Start Benefiber 2 teaspoons daily x2 weeks, then increase to twice daily.  We will hold off on referral to general surgery due to history of recurrent diverticulitis for now, but if you have another flare, we may need to get you referred for consideration of surgery to remove the section of the colon that is getting inflamed.   We will plan to follow-up with you in 6 months.  Do not hesitate to call sooner if you have questions or concerns.  Was good to see you again today!  Glad you are doing well overall!  Aliene Altes, PA-C [facility]

## 2022-01-17 DIAGNOSIS — M5430 Sciatica, unspecified side: Secondary | ICD-10-CM | POA: Diagnosis not present

## 2022-01-31 DIAGNOSIS — J209 Acute bronchitis, unspecified: Secondary | ICD-10-CM | POA: Diagnosis not present

## 2022-01-31 DIAGNOSIS — J019 Acute sinusitis, unspecified: Secondary | ICD-10-CM | POA: Diagnosis not present

## 2022-02-14 DIAGNOSIS — J209 Acute bronchitis, unspecified: Secondary | ICD-10-CM | POA: Diagnosis not present

## 2022-02-15 ENCOUNTER — Ambulatory Visit: Payer: Medicare Other | Admitting: Orthopaedic Surgery

## 2022-02-23 ENCOUNTER — Encounter: Payer: Self-pay | Admitting: Orthopaedic Surgery

## 2022-02-23 ENCOUNTER — Ambulatory Visit (INDEPENDENT_AMBULATORY_CARE_PROVIDER_SITE_OTHER): Payer: Medicare Other

## 2022-02-23 ENCOUNTER — Ambulatory Visit: Payer: Medicare Other | Admitting: Orthopaedic Surgery

## 2022-02-23 VITALS — BP 130/80 | HR 88 | Ht 63.0 in | Wt 209.0 lb

## 2022-02-23 DIAGNOSIS — M5442 Lumbago with sciatica, left side: Secondary | ICD-10-CM | POA: Diagnosis not present

## 2022-02-23 DIAGNOSIS — G8929 Other chronic pain: Secondary | ICD-10-CM | POA: Diagnosis not present

## 2022-02-23 MED ORDER — NAPROXEN 500 MG PO TABS: 500.0000 mg | ORAL_TABLET | Freq: Two times a day (BID) | ORAL | 5 refills | Status: AC

## 2022-02-23 NOTE — Patient Instructions (Signed)
Take the Naproxen twice daily WITH A MEAL. Taking this medication with food will help to lessen stomach side effects.

## 2022-02-23 NOTE — Progress Notes (Signed)
Subjective:    Patient ID: Holly [name], female    DOB: [birth year], 66 y.o.   MRN: [id number]  HPI She has had lower back pain with left sided sciatica for about two months.  On 01-17-22 she went to [facility] and was evaluated.  She was given steroid injection and that helped a lot.  She has had much less pain and almost no paresthesias to the left side.  She is moving better.  She has no weakness, no trauma.  She had tried Tylenol and ice and heat that did not help.  I have reviewed the notes from [facility].   Review of Systems  Constitutional:  Positive for activity change.  Musculoskeletal:  Positive for arthralgias and back pain.  All other systems reviewed and are negative. For Review of Systems, all other systems reviewed and are negative.  The following is a summary of the past history medically, past history surgically, known current medicines, social history and family history.  This information is gathered electronically by the computer from prior information and documentation.  I review this each visit and have found including this information at this point in the chart is beneficial and informative.   Past Medical History:  Diagnosis Date   Arthritis    knees   Constipation    chronic   Diverticulitis 2006   2015, 2019, March 2022, July 2023   Diverticulosis    Enteritis    Fatty liver disease, nonalcoholic OCT 6761 U/S [place] [address] [place]   DEC 2016 NL [facility] [place]   Gallstone OCT 2016 [facility]   GERD (gastroesophageal reflux disease)    Hay fever    Headache(784.0)    IBS (irritable colon syndrome)    with constipation   Sinus problem     Past Surgical History:  Procedure Laterality Date   ABDOMINAL HYSTERECTOMY  2003   partial   CATARACT EXTRACTION Bilateral    COLONOSCOPY  02/2011   Dr. Doristine Mango: PROPOFOL. scattered diverticula, sessile polyp descending colon, path unavailable. recommenced five year follow up colonoscopy   COLONOSCOPY N/A  04/06/2015   Surgeon: Danie Binder, MD;  moderate diverticulosis throughout the entire colon, 1 hyperplastic polyp, 1 tubular adenoma, and moderate sized internal hemorrhoids. Recommended repeat in 5-10 years.    COLONOSCOPY WITH PROPOFOL N/A 03/25/2021   Surgeon: Eloise Harman, DO;   nonbleeding internal hemorrhoids, multiple small and large mouth diverticula in the sigmoid colon, 2 sessile polyps 3-5 mm in size in the transverse colon s/p resection and retrieval, 1 polyp not retrieved.  Pathology showed inflammatory polyp.  Recommended 10-year screening colonoscopy.   FOOT SURGERY     bilateral -bone removal   KNEE CLOSED REDUCTION  04/02/2012   Procedure: CLOSED MANIPULATION KNEE;  Surgeon: Gearlean Alf, MD;  Location: [facility];  Service: Orthopedics;  Laterality: Left;   POLYPECTOMY  03/25/2021   Procedure: POLYPECTOMY;  Surgeon: Eloise Harman, DO;  [facility];  Service: Endoscopy;;   TONSILLECTOMY     age 27   TOTAL KNEE ARTHROPLASTY  01/30/2012   Procedure: TOTAL KNEE ARTHROPLASTY;  Surgeon: Gearlean Alf, MD;  Location: [facility];  Service: Orthopedics;  Laterality: Left;    Current Outpatient Medications on File Prior to Visit  Medication Sig Dispense Refill   acetaminophen (TYLENOL) 500 MG tablet Take 1,000 mg by mouth every 6 (six) hours as needed for mild pain.     albuterol (VENTOLIN HFA) 108 (90 Base)  MCG/ACT inhaler Inhale 1-2 puffs into the lungs every 6 (six) hours as needed for wheezing or shortness of breath.     atorvastatin (LIPITOR) 20 MG tablet Take 20 mg by mouth daily.     BREO ELLIPTA 100-25 MCG/INH AEPB Inhale 1 puff into the lungs daily.     cyclobenzaprine (FLEXERIL) 10 MG tablet Take 10 mg by mouth at bedtime.     dexlansoprazole (DEXILANT) 60 MG capsule Take 1 capsule by mouth once daily 90 capsule 1   hydrochlorothiazide (HYDRODIURIL) 25 MG tablet Take 25 mg by mouth every morning.     linaclotide (LINZESS) 72 MCG capsule Take 1 capsule  (72 mcg total) by mouth daily before breakfast. 90 capsule 1   montelukast (SINGULAIR) 10 MG tablet Take 10 mg by mouth at bedtime.     ondansetron (ZOFRAN) 4 MG tablet Take 1 tablet (4 mg total) by mouth every 8 (eight) hours as needed for nausea or vomiting. 20 tablet 0   ondansetron (ZOFRAN-ODT) 4 MG disintegrating tablet Take 1 tablet (4 mg total) by mouth every 6 (six) hours as needed for nausea or vomiting. 30 tablet 0   No current facility-administered medications on file prior to visit.    Social History   Socioeconomic History   Marital status: Single    Spouse name: Not on file   Number of children: 3   Years of education: Not on file   Highest education level: Not on file  Occupational History   Occupation: out of work  Tobacco Use   Smoking status: Former    Packs/day: 0.50    Years: 40.00    Total pack years: 20.00    Types: Cigarettes    Quit date: 01/28/2012    Years since quitting: 10.0   Smokeless tobacco: Never  Vaping Use   Vaping Use: Never used  Substance and Sexual Activity   Alcohol use: No   Drug use: No   Sexual activity: Not Currently  Other Topics Concern   Not on file  Social History Narrative   Not on file   Social Determinants of Health   Financial Resource Strain: Not on file  Food Insecurity: Not on file  Transportation Needs: Not on file  Physical Activity: Not on file  Stress: Not on file  Social Connections: Not on file  Intimate Partner Violence: Not on file    Family History  Problem Relation Age of Onset   Liver cancer Sister        deceased   Colon cancer Neg Hx     BP 130/80   Pulse 88   Ht '5\' 3"'$  (1.6 m)   Wt 209 lb (94.8 kg)   BMI 37.02 kg/m   Body mass index is 37.02 kg/m.      Objective:   Physical Exam Vitals and nursing note reviewed. Exam conducted with a chaperone present.  Constitutional:      Appearance: She is well-developed.  HENT:     Head: Normocephalic and atraumatic.  Eyes:      Conjunctiva/sclera: Conjunctivae normal.     Pupils: Pupils are equal, round, and reactive to light.  Cardiovascular:     Rate and Rhythm: Normal rate and regular rhythm.  Pulmonary:     Effort: Pulmonary effort is normal.  Abdominal:     Palpations: Abdomen is soft.  Musculoskeletal:       Arms:     Cervical back: Normal range of motion and neck supple.  Skin:  General: Skin is warm and dry.  Neurological:     Mental Status: She is alert and oriented to person, place, and time.     Cranial Nerves: No cranial nerve deficit.     Motor: No abnormal muscle tone.     Coordination: Coordination normal.     Deep Tendon Reflexes: Reflexes are normal and symmetric. Reflexes normal.  Psychiatric:        Behavior: Behavior normal.        Thought Content: Thought content normal.        Judgment: Judgment normal.   X-rays were done of the lumbar spine, reported separately.        Assessment & Plan:   Encounter Diagnosis  Name Primary?   Chronic left-sided low back pain with left-sided sciatica Yes   I will begin Naprosyn for her.  I know she has some GI issues and if it bothers her in any way, stop.   Return in three weeks.  Call if any problem.  Precautions discussed.  Electronically Signed Sanjuana Kava, MD 11/29/20239:21 AM

## 2022-03-04 DIAGNOSIS — I10 Essential (primary) hypertension: Secondary | ICD-10-CM | POA: Diagnosis not present

## 2022-03-04 DIAGNOSIS — E7800 Pure hypercholesterolemia, unspecified: Secondary | ICD-10-CM | POA: Diagnosis not present

## 2022-03-04 DIAGNOSIS — E559 Vitamin D deficiency, unspecified: Secondary | ICD-10-CM | POA: Diagnosis not present

## 2022-03-04 DIAGNOSIS — D519 Vitamin B12 deficiency anemia, unspecified: Secondary | ICD-10-CM | POA: Diagnosis not present

## 2022-03-04 DIAGNOSIS — R945 Abnormal results of liver function studies: Secondary | ICD-10-CM | POA: Diagnosis not present

## 2022-03-04 DIAGNOSIS — R739 Hyperglycemia, unspecified: Secondary | ICD-10-CM | POA: Diagnosis not present

## 2022-03-04 DIAGNOSIS — R5382 Chronic fatigue, unspecified: Secondary | ICD-10-CM | POA: Diagnosis not present

## 2022-03-04 DIAGNOSIS — E782 Mixed hyperlipidemia: Secondary | ICD-10-CM | POA: Diagnosis not present

## 2022-03-04 DIAGNOSIS — E7849 Other hyperlipidemia: Secondary | ICD-10-CM | POA: Diagnosis not present

## 2022-03-08 DIAGNOSIS — E559 Vitamin D deficiency, unspecified: Secondary | ICD-10-CM | POA: Diagnosis not present

## 2022-03-08 DIAGNOSIS — E1165 Type 2 diabetes mellitus with hyperglycemia: Secondary | ICD-10-CM | POA: Diagnosis not present

## 2022-03-08 DIAGNOSIS — I10 Essential (primary) hypertension: Secondary | ICD-10-CM | POA: Diagnosis not present

## 2022-03-16 ENCOUNTER — Ambulatory Visit: Payer: Medicare Other | Admitting: Orthopaedic Surgery

## 2022-04-30 ENCOUNTER — Other Ambulatory Visit: Payer: Self-pay | Admitting: Gastroenterology

## 2022-04-30 DIAGNOSIS — K219 Gastro-esophageal reflux disease without esophagitis: Secondary | ICD-10-CM

## 2022-05-04 ENCOUNTER — Encounter: Payer: Self-pay | Admitting: Internal Medicine

## 2022-05-26 ENCOUNTER — Encounter: Payer: Self-pay | Admitting: Radiology

## 2022-06-01 DIAGNOSIS — R945 Abnormal results of liver function studies: Secondary | ICD-10-CM | POA: Diagnosis not present

## 2022-06-01 DIAGNOSIS — R739 Hyperglycemia, unspecified: Secondary | ICD-10-CM | POA: Diagnosis not present

## 2022-06-01 DIAGNOSIS — E7849 Other hyperlipidemia: Secondary | ICD-10-CM | POA: Diagnosis not present

## 2022-06-01 DIAGNOSIS — E1165 Type 2 diabetes mellitus with hyperglycemia: Secondary | ICD-10-CM | POA: Diagnosis not present

## 2022-06-07 DIAGNOSIS — E1165 Type 2 diabetes mellitus with hyperglycemia: Secondary | ICD-10-CM | POA: Diagnosis not present

## 2022-06-07 DIAGNOSIS — I10 Essential (primary) hypertension: Secondary | ICD-10-CM | POA: Diagnosis not present

## 2022-06-07 DIAGNOSIS — E559 Vitamin D deficiency, unspecified: Secondary | ICD-10-CM | POA: Diagnosis not present

## 2022-06-07 DIAGNOSIS — L299 Pruritus, unspecified: Secondary | ICD-10-CM | POA: Diagnosis not present

## 2022-08-30 DIAGNOSIS — E1165 Type 2 diabetes mellitus with hyperglycemia: Secondary | ICD-10-CM | POA: Diagnosis not present

## 2022-08-30 DIAGNOSIS — E7849 Other hyperlipidemia: Secondary | ICD-10-CM | POA: Diagnosis not present

## 2022-08-30 DIAGNOSIS — Z1231 Encounter for screening mammogram for malignant neoplasm of breast: Secondary | ICD-10-CM | POA: Diagnosis not present

## 2022-09-02 DIAGNOSIS — E875 Hyperkalemia: Secondary | ICD-10-CM | POA: Diagnosis not present

## 2022-09-06 DIAGNOSIS — Z13820 Encounter for screening for osteoporosis: Secondary | ICD-10-CM | POA: Diagnosis not present

## 2022-09-06 DIAGNOSIS — N1831 Chronic kidney disease, stage 3a: Secondary | ICD-10-CM | POA: Diagnosis not present

## 2022-09-06 DIAGNOSIS — E1122 Type 2 diabetes mellitus with diabetic chronic kidney disease: Secondary | ICD-10-CM | POA: Diagnosis not present

## 2022-09-06 DIAGNOSIS — Z8719 Personal history of other diseases of the digestive system: Secondary | ICD-10-CM | POA: Diagnosis not present

## 2022-09-06 DIAGNOSIS — E559 Vitamin D deficiency, unspecified: Secondary | ICD-10-CM | POA: Diagnosis not present

## 2022-09-06 DIAGNOSIS — I10 Essential (primary) hypertension: Secondary | ICD-10-CM | POA: Diagnosis not present

## 2022-09-06 DIAGNOSIS — E1165 Type 2 diabetes mellitus with hyperglycemia: Secondary | ICD-10-CM | POA: Diagnosis not present

## 2022-09-06 DIAGNOSIS — Z0001 Encounter for general adult medical examination with abnormal findings: Secondary | ICD-10-CM | POA: Diagnosis not present

## 2022-10-15 ENCOUNTER — Other Ambulatory Visit: Payer: Self-pay | Admitting: Gastroenterology

## 2022-10-15 DIAGNOSIS — K581 Irritable bowel syndrome with constipation: Secondary | ICD-10-CM

## 2022-10-18 ENCOUNTER — Other Ambulatory Visit: Payer: Self-pay | Admitting: Gastroenterology

## 2022-10-18 DIAGNOSIS — K5900 Constipation, unspecified: Secondary | ICD-10-CM

## 2022-10-18 MED ORDER — LINACLOTIDE 145 MCG PO CAPS: 145.0000 ug | ORAL_CAPSULE | ORAL | 1 refills | Status: DC

## 2022-11-04 ENCOUNTER — Other Ambulatory Visit: Payer: Self-pay | Admitting: Gastroenterology

## 2022-11-04 DIAGNOSIS — K219 Gastro-esophageal reflux disease without esophagitis: Secondary | ICD-10-CM

## 2022-11-04 NOTE — Progress Notes (Unsigned)
Referring Provider: Richardean Chimera, MD Primary Care Physician:  Richardean Chimera, MD Primary GI Physician: Dr. Barbette Or  Chief Complaint  Patient presents with   Abdominal Pain    Abdominal pain lower left side is hurting. Thinks diverticulitis is flaring up    HPI:   Holly [name] is a 67 y.o. female  with history of GERD, IBS-C, adenomatous colon polyps, history of recurrent sigmoid diverticulitis with most recent colonoscopy completed in December 2022 due for surveillance in 2032, presenting today with chief plaint of LLQ abdominal pain.   Last episode of documented sigmoid diverticulitis was in July 2023.  Last seen in our office 12/13/2021.  She had recovered from her recent episode of diverticulitis and was doing well overall.  GERD fairly well-controlled on Dexilant 60 mg daily.  She did notes intermittent breakthrough nocturnal symptoms.  Recommended starting Pepcid 20 mg at bedtime.  IBS controlled with 2 Linzess 72 mcg every other day.  Recommended starting Benefiber considering history of diverticulitis.  Today: LLQ pain started around June. Had some 3 days of Augmentin left over from last year and took some of this and her abdominal pain resolved. Now pain is off and on. No specific triggers.  Feels like it does when she has diverticulitis.  Taking Linzess 145 mcg every other day and bowels are moving well. No brbpr or melena. No fever. Occasional nausea but not vomiting. GERD fairly well-controlled on Dexilant 60 mg daily and Pepcid at bedtime.  No urinary symptoms.  Started Ozempic 3-4 months ago.    Past Medical History:  Diagnosis Date   Arthritis    knees   Constipation    chronic   Diverticulitis 2006   2015, 2019, March 2022, July 2023   Diverticulosis    Enteritis    Fatty liver disease, nonalcoholic OCT 2016 [facility]   DEC 2016 NL HFP [facility]   Gallstone OCT 2016 [facility]   GERD (gastroesophageal reflux disease)    Hay fever    Headache(784.0)    IBS  (irritable colon syndrome)    with constipation   Sinus problem     Past Surgical History:  Procedure Laterality Date   ABDOMINAL HYSTERECTOMY  2003   partial   CATARACT EXTRACTION Bilateral    COLONOSCOPY  02/2011   Dr. Jones Skene: PROPOFOL. scattered diverticula, sessile polyp descending colon, path unavailable. recommenced five year follow up colonoscopy   COLONOSCOPY N/A 04/06/2015   Surgeon: West Bali, MD;  moderate diverticulosis throughout the entire colon, 1 hyperplastic polyp, 1 tubular adenoma, and moderate sized internal hemorrhoids. Recommended repeat in 5-10 years.    COLONOSCOPY WITH PROPOFOL N/A 03/25/2021   Surgeon: Lanelle Bal, DO;   nonbleeding internal hemorrhoids, multiple small and large mouth diverticula in the sigmoid colon, 2 sessile polyps 3-5 mm in size in the transverse colon s/p resection and retrieval, 1 polyp not retrieved.  Pathology showed inflammatory polyp.  Recommended 10-year screening colonoscopy.   FOOT SURGERY     bilateral -bone removal   KNEE CLOSED REDUCTION  04/02/2012   Procedure: CLOSED MANIPULATION KNEE;  Surgeon: Loanne Drilling, MD;  Location: [facility];  Service: Orthopedics;  Laterality: Left;   POLYPECTOMY  03/25/2021   Procedure: POLYPECTOMY;  Surgeon: Lanelle Bal, DO;  [facility];  Service: Endoscopy;;   TONSILLECTOMY     age 77   TOTAL KNEE ARTHROPLASTY  01/30/2012   Procedure: TOTAL KNEE ARTHROPLASTY;  Surgeon: Gus Rankin  Aluisio, MD;  Location: [facility];  Service: Orthopedics;  Laterality: Left;    Current Outpatient Medications  Medication Sig Dispense Refill   acetaminophen (TYLENOL) 500 MG tablet Take 1,000 mg by mouth every 6 (six) hours as needed for mild pain.     albuterol (VENTOLIN HFA) 108 (90 Base) MCG/ACT inhaler Inhale 1-2 puffs into the lungs every 6 (six) hours as needed for wheezing or shortness of breath.     atorvastatin (LIPITOR) 20 MG tablet Take 20 mg by mouth daily.     BREO  ELLIPTA 100-25 MCG/INH AEPB Inhale 1 puff into the lungs daily.     cyclobenzaprine (FLEXERIL) 10 MG tablet Take 10 mg by mouth at bedtime.     dexlansoprazole (DEXILANT) 60 MG capsule Take 1 capsule by mouth once daily 90 capsule 1   FARXIGA 5 MG TABS tablet Take 5 mg by mouth daily.     glipiZIDE (GLUCOTROL XL) 10 MG 24 hr tablet Take 10 mg by mouth daily.     hydrochlorothiazide (HYDRODIURIL) 25 MG tablet Take 25 mg by mouth every morning.     linaclotide (LINZESS) 145 MCG CAPS capsule Take 1 capsule (145 mcg total) by mouth every other day. 30 capsule 1   montelukast (SINGULAIR) 10 MG tablet Take 10 mg by mouth at bedtime.     naproxen (NAPROSYN) 500 MG tablet Take 1 tablet (500 mg total) by mouth 2 (two) times daily with a meal. 60 tablet 5   ONETOUCH ULTRA test strip USE 1 STRIP TO CHECK GLUCOSE 4 TIMES DAILY     OZEMPIC, 0.25 OR 0.5 MG/DOSE, 2 MG/3ML SOPN SMARTSIG:0.25 Milliliter(s) SUB-Q Once a Week     Vitamin D, Ergocalciferol, (DRISDOL) 1.25 MG (50000 UNIT) CAPS capsule Take 50,000 Units by mouth once a week.     ondansetron (ZOFRAN) 4 MG tablet Take 1 tablet (4 mg total) by mouth every 8 (eight) hours as needed for nausea or vomiting. (Patient not taking: Reported on 11/07/2022) 20 tablet 0   ondansetron (ZOFRAN-ODT) 4 MG disintegrating tablet Take 1 tablet (4 mg total) by mouth every 6 (six) hours as needed for nausea or vomiting. (Patient not taking: Reported on 11/07/2022) 30 tablet 0   No current facility-administered medications for this visit.    Allergies as of 11/07/2022 - Review Complete 11/07/2022  Allergen Reaction Noted   Codeine Nausea And Vomiting 01/20/2012   Morphine and codeine Nausea And Vomiting 01/20/2012    Family History  Problem Relation Age of Onset   Liver cancer Sister        deceased   Colon cancer Neg Hx     Social History   Socioeconomic History   Marital status: Single    Spouse name: Not on file   Number of children: 3   Years of  education: Not on file   Highest education level: Not on file  Occupational History   Occupation: out of work  Tobacco Use   Smoking status: Former    Current packs/day: 0.00    Average packs/day: 0.5 packs/day for 40.0 years (20.0 ttl pk-yrs)    Types: Cigarettes    Start date: 01/28/1972    Quit date: 01/28/2012    Years since quitting: 10.7   Smokeless tobacco: Never  Vaping Use   Vaping status: Never Used  Substance and Sexual Activity   Alcohol use: No   Drug use: No   Sexual activity: Not Currently  Other Topics Concern   Not on file  Social History Narrative   Not on file   Social Determinants of Health   Financial Resource Strain: Not on file  Food Insecurity: No Food Insecurity (01/21/2019)   Received from [facility], [facility]   Hunger Vital Sign    Worried About Running Out of Food in the Last Year: Never true    Ran Out of Food in the Last Year: Never true  Transportation Needs: No Transportation Needs (01/21/2019)   Received from [facility], [facility]   [facility] - Transportation    Lack of Transportation (Medical): No    Lack of Transportation (Non-Medical): No  Physical Activity: Not on file  Stress: No Stress Concern Present (11/14/2019)   Received from [facility], [facility] - Occupational Stress Questionnaire    Feeling of Stress : Not at all  Social Connections: Unknown (08/10/2021)   [facility], [facility]   Social Network    Social Network: Not on file    Review of Systems: Gen: Denies fever, chills, cold or flulike symptoms, presyncope, syncope. CV: Denies chest pain, palpitations. Resp: Denies dyspnea, cough. GI: See HPI Heme: See HPI  Physical Exam: BP 126/81 (BP Location: Right Arm, Patient Position: Sitting, Cuff Size: Large)   Pulse 92   Temp 98.1 F (36.7 C) (Temporal)   Ht 5\' 3"  (1.6 m)   Wt 214 lb (97.1 kg)   SpO2 93%   BMI 37.91 kg/m   General:   Alert and oriented. No distress noted. Pleasant and cooperative.  Head:  Normocephalic and atraumatic. Eyes:  Conjuctiva clear without scleral icterus. Heart:  S1, S2 present without murmurs appreciated. Lungs:  Clear to auscultation bilaterally. No wheezes, rales, or rhonchi. No distress.  Abdomen:  +BS, soft, and non-distended.  Mild TTP in LLQ.  No rebound or guarding. No HSM or masses noted. Msk:  Symmetrical without gross deformities. Normal posture. Extremities:  Without edema. Neurologic:  Alert and  oriented x4 Psych:  Normal mood and affect.    Assessment:  67 y.o. female  with history of GERD, IBS-C, adenomatous colon polyps, history of recurrent sigmoid diverticulitis with most recent colonoscopy completed in December 2022 due for surveillance in 2032, presenting today with chief plaint of LLQ abdominal pain.  LLQ abdominal pain: Recurrent, intermittent LLQ abdominal pain since June.  Patient took 3 days of an old prescription of Augmentin and initially pain resolved, but symptoms quickly returned and have continued to be mild and intermittent in nature with no specific triggers.  Bowels are moving well.  No alarm symptoms.  Overall, patient feels her symptoms are the same as when she has had diverticulitis. We will arrange for a CT for further evaluation.   Plan:  CT A/P with contrast ASAP Further recommendations to follow.    Ermalinda Memos, PA-C [facility] 11/07/2022

## 2022-11-07 ENCOUNTER — Encounter: Payer: Self-pay | Admitting: Gastroenterology

## 2022-11-07 ENCOUNTER — Telehealth: Payer: Self-pay | Admitting: *Deleted

## 2022-11-07 ENCOUNTER — Ambulatory Visit: Payer: Medicare Other | Admitting: Gastroenterology

## 2022-11-07 VITALS — BP 126/81 | HR 92 | Temp 98.1°F | Ht 63.0 in | Wt 214.0 lb

## 2022-11-07 DIAGNOSIS — R1032 Left lower quadrant pain: Secondary | ICD-10-CM

## 2022-11-07 NOTE — Patient Instructions (Signed)
We will arrange to have a CT scan of your abdomen and pelvis to further evaluate your left lower quadrant abdominal pain and to rule out diverticulitis.   We will call you with results and further recommendations.  Ermalinda Memos, PA-C [facility]

## 2022-11-07 NOTE — Telephone Encounter (Signed)
[facility]  CT scheduled for 11/10/22, arrive at 3:15 pm to start drinking contrast. Nothing to eat after 1:30 pm but can drink water.

## 2022-11-08 DIAGNOSIS — E1142 Type 2 diabetes mellitus with diabetic polyneuropathy: Secondary | ICD-10-CM | POA: Diagnosis not present

## 2022-11-08 DIAGNOSIS — G5791 Unspecified mononeuropathy of right lower limb: Secondary | ICD-10-CM | POA: Diagnosis not present

## 2022-11-08 DIAGNOSIS — M79673 Pain in unspecified foot: Secondary | ICD-10-CM | POA: Diagnosis not present

## 2022-11-08 DIAGNOSIS — G5792 Unspecified mononeuropathy of left lower limb: Secondary | ICD-10-CM | POA: Diagnosis not present

## 2022-11-08 DIAGNOSIS — B351 Tinea unguium: Secondary | ICD-10-CM | POA: Diagnosis not present

## 2022-11-08 NOTE — Telephone Encounter (Signed)
Left detailed message regarding CT appt and to call back to confirm that she got the message.

## 2022-11-10 ENCOUNTER — Ambulatory Visit (HOSPITAL_COMMUNITY)
Admission: RE | Admit: 2022-11-10 | Discharge: 2022-11-10 | Disposition: A | Discharge status: Home / Self Care | Payer: Medicare Other | Source: Ambulatory Visit | Attending: Gastroenterology | Admitting: Gastroenterology

## 2022-11-10 DIAGNOSIS — N281 Cyst of kidney, acquired: Secondary | ICD-10-CM | POA: Diagnosis not present

## 2022-11-10 DIAGNOSIS — K5730 Diverticulosis of large intestine without perforation or abscess without bleeding: Secondary | ICD-10-CM | POA: Diagnosis not present

## 2022-11-10 DIAGNOSIS — R1032 Left lower quadrant pain: Secondary | ICD-10-CM | POA: Diagnosis not present

## 2022-11-10 DIAGNOSIS — K838 Other specified diseases of biliary tract: Secondary | ICD-10-CM | POA: Diagnosis not present

## 2022-11-10 DIAGNOSIS — K8020 Calculus of gallbladder without cholecystitis without obstruction: Secondary | ICD-10-CM | POA: Diagnosis not present

## 2022-11-10 LAB — POCT I-STAT CREATININE: Creatinine, Ser: 0.8 mg/dL (ref 0.44–1.00)

## 2022-11-10 MED ORDER — IOHEXOL 9 MG/ML PO SOLN
ORAL | Status: AC
  Filled 2022-11-10: qty 1000

## 2022-11-10 MED ORDER — IOHEXOL 300 MG/ML  SOLN
100.0000 mL | Freq: Once | INTRAMUSCULAR | Status: AC | PRN
  Administered 2022-11-10: 100 mL via INTRAVENOUS

## 2022-11-10 MED ORDER — IOHEXOL 9 MG/ML PO SOLN
500.0000 mL | ORAL | Status: AC
  Administered 2022-11-10 (×2): 500 mL via ORAL

## 2022-12-09 DIAGNOSIS — I10 Essential (primary) hypertension: Secondary | ICD-10-CM | POA: Diagnosis not present

## 2022-12-09 DIAGNOSIS — E1122 Type 2 diabetes mellitus with diabetic chronic kidney disease: Secondary | ICD-10-CM | POA: Diagnosis not present

## 2022-12-09 DIAGNOSIS — N1831 Chronic kidney disease, stage 3a: Secondary | ICD-10-CM | POA: Diagnosis not present

## 2022-12-31 NOTE — Progress Notes (Deleted)
Referring Provider: Richardean Chimera, MD Primary Care Physician:  Richardean Chimera, MD Primary GI Physician: Dr. Marletta Lor  No chief complaint on file.   HPI:   Holly [name] is a 67 y.o. female with history of GERD, IBS-C, adenomatous colon polyps, history of recurrent sigmoid diverticulitis with most recent colonoscopy completed in December 2022 due for surveillance in 2032, presenting today for follow-up of LLQ abdominal pain.  Last seen in the office 11/07/2022 reporting recurrent, intermittent LLQ abdominal pain since June.  Stated she took 3 days of an old prescription of Augmentin and pain initially resolved, but symptoms quickly returned and had continued to be mild and intermittent in nature with no specific triggers.  Bowels were moving well.  She had no alarm symptoms.  Overall, patient felt her symptoms were similar to prior episodes of diverticulitis.  I recommended CT for further evaluation.  CT was completed 11/10/2022 showing diverticulosis without diverticulitis.  I called and spoke with patient he reported she was feeling fairly well overall.  She continued to have some intermittent, mild LLQ abdominal discomfort.  I suspected this was related to  diverticulosis and/or visceral hypersensitivity.  Recommended adding Benefiber daily and continuing her daily probiotic with 8-week follow-up.    Past Medical History:  Diagnosis Date   Arthritis    knees   Constipation    chronic   Diverticulitis 2006   2015, 2019, March 2022, July 2023   Diverticulosis    Enteritis    Fatty liver disease, nonalcoholic OCT 2016 [facility]   DEC 2016 NL HFP [facility]   Gallstone OCT 2016 [facility]   GERD (gastroesophageal reflux disease)    Hay fever    Headache(784.0)    IBS (irritable colon syndrome)    with constipation   Sinus problem     Past Surgical History:  Procedure Laterality Date   ABDOMINAL HYSTERECTOMY  2003   partial   CATARACT EXTRACTION Bilateral    COLONOSCOPY  02/2011    Dr. Jones Skene: PROPOFOL. scattered diverticula, sessile polyp descending colon, path unavailable. recommenced five year follow up colonoscopy   COLONOSCOPY N/A 04/06/2015   Surgeon: West Bali, MD;  moderate diverticulosis throughout the entire colon, 1 hyperplastic polyp, 1 tubular adenoma, and moderate sized internal hemorrhoids. Recommended repeat in 5-10 years.    COLONOSCOPY WITH PROPOFOL N/A 03/25/2021   Surgeon: Lanelle Bal, DO;   nonbleeding internal hemorrhoids, multiple small and large mouth diverticula in the sigmoid colon, 2 sessile polyps 3-5 mm in size in the transverse colon s/p resection and retrieval, 1 polyp not retrieved.  Pathology showed inflammatory polyp.  Recommended 10-year screening colonoscopy.   FOOT SURGERY     bilateral -bone removal   KNEE CLOSED REDUCTION  04/02/2012   Procedure: CLOSED MANIPULATION KNEE;  Surgeon: Loanne Drilling, MD;  Location: [facility];  Service: Orthopedics;  Laterality: Left;   POLYPECTOMY  03/25/2021   Procedure: POLYPECTOMY;  Surgeon: Lanelle Bal, DO;  [facility];  Service: Endoscopy;;   TONSILLECTOMY     age 45   TOTAL KNEE ARTHROPLASTY  01/30/2012   Procedure: TOTAL KNEE ARTHROPLASTY;  Surgeon: Loanne Drilling, MD;  Location: [facility];  Service: Orthopedics;  Laterality: Left;    Current Outpatient Medications  Medication Sig Dispense Refill   acetaminophen (TYLENOL) 500 MG tablet Take 1,000 mg by mouth every 6 (six) hours as needed for mild pain.     albuterol (VENTOLIN HFA) 108 (90  Base) MCG/ACT inhaler Inhale 1-2 puffs into the lungs every 6 (six) hours as needed for wheezing or shortness of breath.     atorvastatin (LIPITOR) 20 MG tablet Take 20 mg by mouth daily.     BREO ELLIPTA 100-25 MCG/INH AEPB Inhale 1 puff into the lungs daily.     cyclobenzaprine (FLEXERIL) 10 MG tablet Take 10 mg by mouth at bedtime.     dexlansoprazole (DEXILANT) 60 MG capsule Take 1 capsule by mouth once daily 90 capsule  1   FARXIGA 5 MG TABS tablet Take 5 mg by mouth daily.     glipiZIDE (GLUCOTROL XL) 10 MG 24 hr tablet Take 10 mg by mouth daily.     hydrochlorothiazide (HYDRODIURIL) 25 MG tablet Take 25 mg by mouth every morning.     linaclotide (LINZESS) 145 MCG CAPS capsule Take 1 capsule (145 mcg total) by mouth every other day. 30 capsule 1   montelukast (SINGULAIR) 10 MG tablet Take 10 mg by mouth at bedtime.     naproxen (NAPROSYN) 500 MG tablet Take 1 tablet (500 mg total) by mouth 2 (two) times daily with a meal. 60 tablet 5   ondansetron (ZOFRAN) 4 MG tablet Take 1 tablet (4 mg total) by mouth every 8 (eight) hours as needed for nausea or vomiting. (Patient not taking: Reported on 11/07/2022) 20 tablet 0   ondansetron (ZOFRAN-ODT) 4 MG disintegrating tablet Take 1 tablet (4 mg total) by mouth every 6 (six) hours as needed for nausea or vomiting. (Patient not taking: Reported on 11/07/2022) 30 tablet 0   ONETOUCH ULTRA test strip USE 1 STRIP TO CHECK GLUCOSE 4 TIMES DAILY     OZEMPIC, 0.25 OR 0.5 MG/DOSE, 2 MG/3ML SOPN SMARTSIG:0.25 Milliliter(s) SUB-Q Once a Week     Vitamin D, Ergocalciferol, (DRISDOL) 1.25 MG (50000 UNIT) CAPS capsule Take 50,000 Units by mouth once a week.     No current facility-administered medications for this visit.    Allergies as of 01/02/2023 - Reviewed 11/10/2022  Allergen Reaction Noted   Codeine Nausea And Vomiting 01/20/2012   Morphine and codeine Nausea And Vomiting 01/20/2012    Family History  Problem Relation Age of Onset   Liver cancer Sister        deceased   Colon cancer Neg Hx     Social History   Socioeconomic History   Marital status: Single    Spouse name: Not on file   Number of children: 3   Years of education: Not on file   Highest education level: Not on file  Occupational History   Occupation: out of work  Tobacco Use   Smoking status: Former    Current packs/day: 0.00    Average packs/day: 0.5 packs/day for 40.0 years (20.0 ttl  pk-yrs)    Types: Cigarettes    Start date: 01/28/1972    Quit date: 01/28/2012    Years since quitting: 10.9   Smokeless tobacco: Never  Vaping Use   Vaping status: Never Used  Substance and Sexual Activity   Alcohol use: No   Drug use: No   Sexual activity: Not Currently  Other Topics Concern   Not on file  Social History Narrative   Not on file   Social Determinants of Health   Financial Resource Strain: Not on file  Food Insecurity: No Food Insecurity (01/21/2019)   Received from [facility], [facility]   Hunger Vital Sign    Worried About Running Out of Food in  the Last Year: Never true    Ran Out of Food in the Last Year: Never true  Transportation Needs: No Transportation Needs (01/21/2019)   Received from [facility], [facility]   [facility] - Transportation    Lack of Transportation (Medical): No    Lack of Transportation (Non-Medical): No  Physical Activity: Not on file  Stress: No Stress Concern Present (11/14/2019)   Received from [facility], [facility] - Occupational Stress Questionnaire    Feeling of Stress : Not at all  Social Connections: Unknown (08/10/2021)   Received from [facility], [facility]   Social Network    Social Network: Not on file    Review of Systems: Gen: Denies fever, chills, anorexia. Denies fatigue, weakness, weight loss.  CV: Denies chest pain, palpitations, syncope, peripheral edema, and claudication. Resp: Denies dyspnea at rest, cough, wheezing, coughing up blood, and pleurisy. GI: Denies vomiting blood, jaundice, and fecal incontinence.   Denies dysphagia or odynophagia. Derm: Denies rash, itching, dry skin Psych: Denies depression, anxiety, memory loss, confusion. No homicidal or suicidal ideation.  Heme: Denies bruising, bleeding, and enlarged lymph nodes.  Physical Exam: There were no vitals taken for this visit. General:   Alert and oriented. No distress  noted. Pleasant and cooperative.  Head:  Normocephalic and atraumatic. Eyes:  Conjuctiva clear without scleral icterus. Heart:  S1, S2 present without murmurs appreciated. Lungs:  Clear to auscultation bilaterally. No wheezes, rales, or rhonchi. No distress.  Abdomen:  +BS, soft, non-tender and non-distended. No rebound or guarding. No HSM or masses noted. Msk:  Symmetrical without gross deformities. Normal posture. Extremities:  Without edema. Neurologic:  Alert and  oriented x4 Psych:  Normal mood and affect.    Assessment:     Plan:  ***   Ermalinda Memos, PA-C [facility] 01/02/2023

## 2023-01-02 ENCOUNTER — Ambulatory Visit: Payer: Medicare Other | Admitting: Gastroenterology

## 2023-01-02 ENCOUNTER — Encounter: Payer: Self-pay | Admitting: Gastroenterology

## 2023-02-16 DIAGNOSIS — M810 Age-related osteoporosis without current pathological fracture: Secondary | ICD-10-CM | POA: Diagnosis not present

## 2023-02-16 DIAGNOSIS — M85851 Other specified disorders of bone density and structure, right thigh: Secondary | ICD-10-CM | POA: Diagnosis not present

## 2023-02-16 DIAGNOSIS — Z780 Asymptomatic menopausal state: Secondary | ICD-10-CM | POA: Diagnosis not present

## 2023-02-21 DIAGNOSIS — G5792 Unspecified mononeuropathy of left lower limb: Secondary | ICD-10-CM | POA: Diagnosis not present

## 2023-02-21 DIAGNOSIS — G5791 Unspecified mononeuropathy of right lower limb: Secondary | ICD-10-CM | POA: Diagnosis not present

## 2023-02-21 DIAGNOSIS — M79673 Pain in unspecified foot: Secondary | ICD-10-CM | POA: Diagnosis not present

## 2023-03-02 DIAGNOSIS — Z87891 Personal history of nicotine dependence: Secondary | ICD-10-CM | POA: Diagnosis not present

## 2023-03-02 DIAGNOSIS — E782 Mixed hyperlipidemia: Secondary | ICD-10-CM | POA: Diagnosis not present

## 2023-03-02 DIAGNOSIS — R945 Abnormal results of liver function studies: Secondary | ICD-10-CM | POA: Diagnosis not present

## 2023-03-02 DIAGNOSIS — E7849 Other hyperlipidemia: Secondary | ICD-10-CM | POA: Diagnosis not present

## 2023-03-02 DIAGNOSIS — E1165 Type 2 diabetes mellitus with hyperglycemia: Secondary | ICD-10-CM | POA: Diagnosis not present

## 2023-03-02 DIAGNOSIS — E1122 Type 2 diabetes mellitus with diabetic chronic kidney disease: Secondary | ICD-10-CM | POA: Diagnosis not present

## 2023-03-02 DIAGNOSIS — E039 Hypothyroidism, unspecified: Secondary | ICD-10-CM | POA: Diagnosis not present

## 2023-03-02 DIAGNOSIS — N1831 Chronic kidney disease, stage 3a: Secondary | ICD-10-CM | POA: Diagnosis not present

## 2023-03-02 DIAGNOSIS — E559 Vitamin D deficiency, unspecified: Secondary | ICD-10-CM | POA: Diagnosis not present

## 2023-03-06 DIAGNOSIS — M8580 Other specified disorders of bone density and structure, unspecified site: Secondary | ICD-10-CM | POA: Diagnosis not present

## 2023-03-06 DIAGNOSIS — M79605 Pain in left leg: Secondary | ICD-10-CM | POA: Diagnosis not present

## 2023-03-06 DIAGNOSIS — G2581 Restless legs syndrome: Secondary | ICD-10-CM | POA: Diagnosis not present

## 2023-03-06 DIAGNOSIS — E7849 Other hyperlipidemia: Secondary | ICD-10-CM | POA: Diagnosis not present

## 2023-03-06 DIAGNOSIS — M25561 Pain in right knee: Secondary | ICD-10-CM | POA: Diagnosis not present

## 2023-03-06 DIAGNOSIS — Z0000 Encounter for general adult medical examination without abnormal findings: Secondary | ICD-10-CM | POA: Diagnosis not present

## 2023-03-06 DIAGNOSIS — E559 Vitamin D deficiency, unspecified: Secondary | ICD-10-CM | POA: Diagnosis not present

## 2023-03-06 DIAGNOSIS — E1165 Type 2 diabetes mellitus with hyperglycemia: Secondary | ICD-10-CM | POA: Diagnosis not present

## 2023-03-15 DIAGNOSIS — M5442 Lumbago with sciatica, left side: Secondary | ICD-10-CM | POA: Diagnosis not present

## 2023-03-15 DIAGNOSIS — M25561 Pain in right knee: Secondary | ICD-10-CM | POA: Diagnosis not present

## 2023-03-15 DIAGNOSIS — G8929 Other chronic pain: Secondary | ICD-10-CM | POA: Diagnosis not present

## 2023-03-15 DIAGNOSIS — M25562 Pain in left knee: Secondary | ICD-10-CM | POA: Diagnosis not present

## 2023-03-15 DIAGNOSIS — M1711 Unilateral primary osteoarthritis, right knee: Secondary | ICD-10-CM | POA: Diagnosis not present

## 2023-03-28 DIAGNOSIS — M6281 Muscle weakness (generalized): Secondary | ICD-10-CM | POA: Diagnosis not present

## 2023-03-28 DIAGNOSIS — M5117 Intervertebral disc disorders with radiculopathy, lumbosacral region: Secondary | ICD-10-CM | POA: Diagnosis not present

## 2023-03-28 DIAGNOSIS — M25662 Stiffness of left knee, not elsewhere classified: Secondary | ICD-10-CM | POA: Diagnosis not present

## 2023-03-28 DIAGNOSIS — T8484XA Pain due to internal orthopedic prosthetic devices, implants and grafts, initial encounter: Secondary | ICD-10-CM | POA: Diagnosis not present

## 2023-03-28 DIAGNOSIS — T8482XD Fibrosis due to internal orthopedic prosthetic devices, implants and grafts, subsequent encounter: Secondary | ICD-10-CM | POA: Diagnosis not present

## 2023-03-28 DIAGNOSIS — G8929 Other chronic pain: Secondary | ICD-10-CM | POA: Diagnosis not present

## 2023-03-28 DIAGNOSIS — Z96652 Presence of left artificial knee joint: Secondary | ICD-10-CM | POA: Diagnosis not present

## 2023-03-28 DIAGNOSIS — M1711 Unilateral primary osteoarthritis, right knee: Secondary | ICD-10-CM | POA: Diagnosis not present

## 2023-03-28 DIAGNOSIS — M51372 Other intervertebral disc degeneration, lumbosacral region with discogenic back pain and lower extremity pain: Secondary | ICD-10-CM | POA: Diagnosis not present

## 2023-03-30 DIAGNOSIS — Z96652 Presence of left artificial knee joint: Secondary | ICD-10-CM | POA: Diagnosis not present

## 2023-03-30 DIAGNOSIS — M6281 Muscle weakness (generalized): Secondary | ICD-10-CM | POA: Diagnosis not present

## 2023-03-30 DIAGNOSIS — T8482XD Fibrosis due to internal orthopedic prosthetic devices, implants and grafts, subsequent encounter: Secondary | ICD-10-CM | POA: Diagnosis not present

## 2023-04-03 DIAGNOSIS — Z96652 Presence of left artificial knee joint: Secondary | ICD-10-CM | POA: Diagnosis not present

## 2023-04-03 DIAGNOSIS — M6281 Muscle weakness (generalized): Secondary | ICD-10-CM | POA: Diagnosis not present

## 2023-04-03 DIAGNOSIS — T8482XD Fibrosis due to internal orthopedic prosthetic devices, implants and grafts, subsequent encounter: Secondary | ICD-10-CM | POA: Diagnosis not present

## 2023-04-05 DIAGNOSIS — M6281 Muscle weakness (generalized): Secondary | ICD-10-CM | POA: Diagnosis not present

## 2023-04-05 DIAGNOSIS — T8482XD Fibrosis due to internal orthopedic prosthetic devices, implants and grafts, subsequent encounter: Secondary | ICD-10-CM | POA: Diagnosis not present

## 2023-04-05 DIAGNOSIS — Z96652 Presence of left artificial knee joint: Secondary | ICD-10-CM | POA: Diagnosis not present

## 2023-04-10 DIAGNOSIS — M6281 Muscle weakness (generalized): Secondary | ICD-10-CM | POA: Diagnosis not present

## 2023-04-10 DIAGNOSIS — Z96652 Presence of left artificial knee joint: Secondary | ICD-10-CM | POA: Diagnosis not present

## 2023-04-10 DIAGNOSIS — T8482XD Fibrosis due to internal orthopedic prosthetic devices, implants and grafts, subsequent encounter: Secondary | ICD-10-CM | POA: Diagnosis not present

## 2023-04-12 DIAGNOSIS — T8482XD Fibrosis due to internal orthopedic prosthetic devices, implants and grafts, subsequent encounter: Secondary | ICD-10-CM | POA: Diagnosis not present

## 2023-04-12 DIAGNOSIS — M6281 Muscle weakness (generalized): Secondary | ICD-10-CM | POA: Diagnosis not present

## 2023-04-12 DIAGNOSIS — Z96652 Presence of left artificial knee joint: Secondary | ICD-10-CM | POA: Diagnosis not present

## 2023-04-15 ENCOUNTER — Other Ambulatory Visit: Payer: Self-pay | Admitting: Gastroenterology

## 2023-04-15 DIAGNOSIS — K5900 Constipation, unspecified: Secondary | ICD-10-CM

## 2023-04-17 DIAGNOSIS — Z96652 Presence of left artificial knee joint: Secondary | ICD-10-CM | POA: Diagnosis not present

## 2023-04-17 DIAGNOSIS — M6281 Muscle weakness (generalized): Secondary | ICD-10-CM | POA: Diagnosis not present

## 2023-04-17 DIAGNOSIS — T8482XD Fibrosis due to internal orthopedic prosthetic devices, implants and grafts, subsequent encounter: Secondary | ICD-10-CM | POA: Diagnosis not present

## 2023-04-24 DIAGNOSIS — Z96652 Presence of left artificial knee joint: Secondary | ICD-10-CM | POA: Diagnosis not present

## 2023-04-24 DIAGNOSIS — T8482XD Fibrosis due to internal orthopedic prosthetic devices, implants and grafts, subsequent encounter: Secondary | ICD-10-CM | POA: Diagnosis not present

## 2023-04-24 DIAGNOSIS — M6281 Muscle weakness (generalized): Secondary | ICD-10-CM | POA: Diagnosis not present

## 2023-04-26 DIAGNOSIS — T8482XD Fibrosis due to internal orthopedic prosthetic devices, implants and grafts, subsequent encounter: Secondary | ICD-10-CM | POA: Diagnosis not present

## 2023-04-26 DIAGNOSIS — M6281 Muscle weakness (generalized): Secondary | ICD-10-CM | POA: Diagnosis not present

## 2023-04-26 DIAGNOSIS — Z96652 Presence of left artificial knee joint: Secondary | ICD-10-CM | POA: Diagnosis not present

## 2023-05-01 DIAGNOSIS — M6281 Muscle weakness (generalized): Secondary | ICD-10-CM | POA: Diagnosis not present

## 2023-05-01 DIAGNOSIS — Z96652 Presence of left artificial knee joint: Secondary | ICD-10-CM | POA: Diagnosis not present

## 2023-05-01 DIAGNOSIS — T8482XD Fibrosis due to internal orthopedic prosthetic devices, implants and grafts, subsequent encounter: Secondary | ICD-10-CM | POA: Diagnosis not present

## 2023-05-03 DIAGNOSIS — T8482XD Fibrosis due to internal orthopedic prosthetic devices, implants and grafts, subsequent encounter: Secondary | ICD-10-CM | POA: Diagnosis not present

## 2023-05-03 DIAGNOSIS — Z96652 Presence of left artificial knee joint: Secondary | ICD-10-CM | POA: Diagnosis not present

## 2023-05-03 DIAGNOSIS — M6281 Muscle weakness (generalized): Secondary | ICD-10-CM | POA: Diagnosis not present

## 2023-05-08 DIAGNOSIS — M6281 Muscle weakness (generalized): Secondary | ICD-10-CM | POA: Diagnosis not present

## 2023-05-08 DIAGNOSIS — Z96652 Presence of left artificial knee joint: Secondary | ICD-10-CM | POA: Diagnosis not present

## 2023-05-08 DIAGNOSIS — T8482XD Fibrosis due to internal orthopedic prosthetic devices, implants and grafts, subsequent encounter: Secondary | ICD-10-CM | POA: Diagnosis not present

## 2023-05-09 DIAGNOSIS — Z96652 Presence of left artificial knee joint: Secondary | ICD-10-CM | POA: Diagnosis not present

## 2023-05-09 DIAGNOSIS — M25662 Stiffness of left knee, not elsewhere classified: Secondary | ICD-10-CM | POA: Diagnosis not present

## 2023-05-09 DIAGNOSIS — M25561 Pain in right knee: Secondary | ICD-10-CM | POA: Diagnosis not present

## 2023-05-09 DIAGNOSIS — M5442 Lumbago with sciatica, left side: Secondary | ICD-10-CM | POA: Diagnosis not present

## 2023-05-09 DIAGNOSIS — M1711 Unilateral primary osteoarthritis, right knee: Secondary | ICD-10-CM | POA: Diagnosis not present

## 2023-05-09 DIAGNOSIS — T8484XA Pain due to internal orthopedic prosthetic devices, implants and grafts, initial encounter: Secondary | ICD-10-CM | POA: Diagnosis not present

## 2023-05-09 DIAGNOSIS — M25562 Pain in left knee: Secondary | ICD-10-CM | POA: Diagnosis not present

## 2023-05-09 DIAGNOSIS — T8482XD Fibrosis due to internal orthopedic prosthetic devices, implants and grafts, subsequent encounter: Secondary | ICD-10-CM | POA: Diagnosis not present

## 2023-05-09 DIAGNOSIS — G8929 Other chronic pain: Secondary | ICD-10-CM | POA: Diagnosis not present

## 2023-05-10 DIAGNOSIS — Z96652 Presence of left artificial knee joint: Secondary | ICD-10-CM | POA: Diagnosis not present

## 2023-05-10 DIAGNOSIS — T8482XD Fibrosis due to internal orthopedic prosthetic devices, implants and grafts, subsequent encounter: Secondary | ICD-10-CM | POA: Diagnosis not present

## 2023-05-10 DIAGNOSIS — M6281 Muscle weakness (generalized): Secondary | ICD-10-CM | POA: Diagnosis not present

## 2023-05-18 ENCOUNTER — Other Ambulatory Visit: Payer: Self-pay | Admitting: Gastroenterology

## 2023-05-18 DIAGNOSIS — K219 Gastro-esophageal reflux disease without esophagitis: Secondary | ICD-10-CM

## 2023-05-26 ENCOUNTER — Other Ambulatory Visit: Payer: Self-pay | Admitting: Gastroenterology

## 2023-05-26 DIAGNOSIS — K219 Gastro-esophageal reflux disease without esophagitis: Secondary | ICD-10-CM

## 2023-06-03 ENCOUNTER — Other Ambulatory Visit: Payer: Self-pay | Admitting: Gastroenterology

## 2023-06-03 DIAGNOSIS — K219 Gastro-esophageal reflux disease without esophagitis: Secondary | ICD-10-CM

## 2023-06-05 DIAGNOSIS — E782 Mixed hyperlipidemia: Secondary | ICD-10-CM | POA: Diagnosis not present

## 2023-06-05 DIAGNOSIS — E7800 Pure hypercholesterolemia, unspecified: Secondary | ICD-10-CM | POA: Diagnosis not present

## 2023-06-05 DIAGNOSIS — R945 Abnormal results of liver function studies: Secondary | ICD-10-CM | POA: Diagnosis not present

## 2023-06-05 DIAGNOSIS — Z87891 Personal history of nicotine dependence: Secondary | ICD-10-CM | POA: Diagnosis not present

## 2023-06-05 DIAGNOSIS — E7849 Other hyperlipidemia: Secondary | ICD-10-CM | POA: Diagnosis not present

## 2023-06-05 DIAGNOSIS — Z131 Encounter for screening for diabetes mellitus: Secondary | ICD-10-CM | POA: Diagnosis not present

## 2023-06-05 NOTE — Telephone Encounter (Signed)
 Rx was sent on 05/19/23.

## 2023-06-12 DIAGNOSIS — E559 Vitamin D deficiency, unspecified: Secondary | ICD-10-CM | POA: Diagnosis not present

## 2023-06-12 DIAGNOSIS — E7800 Pure hypercholesterolemia, unspecified: Secondary | ICD-10-CM | POA: Diagnosis not present

## 2023-06-12 DIAGNOSIS — N1831 Chronic kidney disease, stage 3a: Secondary | ICD-10-CM | POA: Diagnosis not present

## 2023-06-12 DIAGNOSIS — E1122 Type 2 diabetes mellitus with diabetic chronic kidney disease: Secondary | ICD-10-CM | POA: Diagnosis not present

## 2023-06-13 DIAGNOSIS — M5442 Lumbago with sciatica, left side: Secondary | ICD-10-CM | POA: Diagnosis not present

## 2023-06-13 DIAGNOSIS — Z96652 Presence of left artificial knee joint: Secondary | ICD-10-CM | POA: Diagnosis not present

## 2023-06-13 DIAGNOSIS — G8929 Other chronic pain: Secondary | ICD-10-CM | POA: Diagnosis not present

## 2023-06-13 DIAGNOSIS — M25662 Stiffness of left knee, not elsewhere classified: Secondary | ICD-10-CM | POA: Diagnosis not present

## 2023-06-13 DIAGNOSIS — T8482XD Fibrosis due to internal orthopedic prosthetic devices, implants and grafts, subsequent encounter: Secondary | ICD-10-CM | POA: Diagnosis not present

## 2023-06-13 DIAGNOSIS — M1711 Unilateral primary osteoarthritis, right knee: Secondary | ICD-10-CM | POA: Diagnosis not present

## 2023-06-15 NOTE — Progress Notes (Unsigned)
 Referring Provider: Richardean Chimera, MD Primary Care Physician:  Richardean Chimera, MD Primary GI Physician: Dr. Marletta Lor  No chief complaint on file.   HPI:   Holly [name] is a 68 y.o. female with history of GERD, IBS-C, adenomatous colon polyps, history of recurrent sigmoid diverticulitis with most recent colonoscopy completed in December 2022 due for surveillance in 2032, presenting today for follow-up, medication refills***  Last seen in the office 11/07/2022 reporting recurrent LLQ abdominal pain since June.  Stated she took 3 days of an old prescription of Augmentin and pain initially resolved, but symptoms quickly returned and had continued to be mild and intermittent in nature but no specific triggers.  Bowels moving well.  No alarm symptoms.  Recommended CT for further evaluation as patient felt symptoms were similar to prior diverticulitis.   CT A/P with contrast 11/10/2022 with gallbladder sludge, cholelithiasis, bilateral renal cysts, diverticulosis without diverticulitis.  I spoke with patient.  She was feeling fairly well.  Continued to have some intermittent, mild LLQ abdominal pain.  Suspected this was related to diverticulosis, visceral hypersensitivity.  She had already started a daily probiotic. Recommended adding Benefiber daily, 8-week follow-up.   Patient no showed to her follow-up.    Today:     Past Medical History:  Diagnosis Date   Arthritis    knees   Constipation    chronic   Diverticulitis 2006   2015, 2019, March 2022, July 2023   Diverticulosis    Enteritis    Fatty liver disease, nonalcoholic OCT 2016 [facility]   DEC 2016 NL [facility]   Gallstone OCT 2016 [facility]   GERD (gastroesophageal reflux disease)    Hay fever    Headache(784.0)    IBS (irritable colon syndrome)    with constipation   Sinus problem     Past Surgical History:  Procedure Laterality Date   ABDOMINAL HYSTERECTOMY  2003   partial   CATARACT EXTRACTION Bilateral     COLONOSCOPY  02/2011   Dr. Jones Skene: PROPOFOL. scattered diverticula, sessile polyp descending colon, path unavailable. recommenced five year follow up colonoscopy   COLONOSCOPY N/A 04/06/2015   Surgeon: West Bali, MD;  moderate diverticulosis throughout the entire colon, 1 hyperplastic polyp, 1 tubular adenoma, and moderate sized internal hemorrhoids. Recommended repeat in 5-10 years.    COLONOSCOPY WITH PROPOFOL N/A 03/25/2021   Surgeon: Lanelle Bal, DO;   nonbleeding internal hemorrhoids, multiple small and large mouth diverticula in the sigmoid colon, 2 sessile polyps 3-5 mm in size in the transverse colon s/p resection and retrieval, 1 polyp not retrieved.  Pathology showed inflammatory polyp.  Recommended 10-year screening colonoscopy.   FOOT SURGERY     bilateral -bone removal   KNEE CLOSED REDUCTION  04/02/2012   Procedure: CLOSED MANIPULATION KNEE;  Surgeon: Loanne Drilling, MD;  Location: [facility];  Service: Orthopedics;  Laterality: Left;   POLYPECTOMY  03/25/2021   Procedure: POLYPECTOMY;  Surgeon: Lanelle Bal, DO;  [facility];  Service: Endoscopy;;   TONSILLECTOMY     age 52   TOTAL KNEE ARTHROPLASTY  01/30/2012   Procedure: TOTAL KNEE ARTHROPLASTY;  Surgeon: Loanne Drilling, MD;  Location: [facility];  Service: Orthopedics;  Laterality: Left;    Current Outpatient Medications  Medication Sig Dispense Refill   acetaminophen (TYLENOL) 500 MG tablet Take 1,000 mg by mouth every 6 (six) hours as needed for mild pain.     albuterol (VENTOLIN  HFA) 108 (90 Base) MCG/ACT inhaler Inhale 1-2 puffs into the lungs every 6 (six) hours as needed for wheezing or shortness of breath.     atorvastatin (LIPITOR) 20 MG tablet Take 20 mg by mouth daily.     BREO ELLIPTA 100-25 MCG/INH AEPB Inhale 1 puff into the lungs daily.     cyclobenzaprine (FLEXERIL) 10 MG tablet Take 10 mg by mouth at bedtime.     dexlansoprazole (DEXILANT) 60 MG capsule Take 1 capsule by  mouth once daily 90 capsule 1   FARXIGA 5 MG TABS tablet Take 5 mg by mouth daily.     glipiZIDE (GLUCOTROL XL) 10 MG 24 hr tablet Take 10 mg by mouth daily.     hydrochlorothiazide (HYDRODIURIL) 25 MG tablet Take 25 mg by mouth every morning.     linaclotide (LINZESS) 145 MCG CAPS capsule TAKE 1 CAPSULE BY MOUTH EVERY OTHER DAY 30 capsule 3   montelukast (SINGULAIR) 10 MG tablet Take 10 mg by mouth at bedtime.     naproxen (NAPROSYN) 500 MG tablet Take 1 tablet (500 mg total) by mouth 2 (two) times daily with a meal. 60 tablet 5   ondansetron (ZOFRAN) 4 MG tablet Take 1 tablet (4 mg total) by mouth every 8 (eight) hours as needed for nausea or vomiting. (Patient not taking: Reported on 11/07/2022) 20 tablet 0   ondansetron (ZOFRAN-ODT) 4 MG disintegrating tablet Take 1 tablet (4 mg total) by mouth every 6 (six) hours as needed for nausea or vomiting. (Patient not taking: Reported on 11/07/2022) 30 tablet 0   ONETOUCH ULTRA test strip USE 1 STRIP TO CHECK GLUCOSE 4 TIMES DAILY     OZEMPIC, 0.25 OR 0.5 MG/DOSE, 2 MG/3ML SOPN SMARTSIG:0.25 Milliliter(s) SUB-Q Once a Week     Vitamin D, Ergocalciferol, (DRISDOL) 1.25 MG (50000 UNIT) CAPS capsule Take 50,000 Units by mouth once a week.     No current facility-administered medications for this visit.    Allergies as of 06/16/2023 - Reviewed 11/10/2022  Allergen Reaction Noted   Codeine Nausea And Vomiting 01/20/2012   Morphine and codeine Nausea And Vomiting 01/20/2012    Family History  Problem Relation Age of Onset   Liver cancer Sister        deceased   Colon cancer Neg Hx     Social History   Socioeconomic History   Marital status: Single    Spouse name: Not on file   Number of children: 3   Years of education: Not on file   Highest education level: Not on file  Occupational History   Occupation: out of work  Tobacco Use   Smoking status: Former    Current packs/day: 0.00    Average packs/day: 0.5 packs/day for 40.0 years (20.0  ttl pk-yrs)    Types: Cigarettes    Start date: 01/28/1972    Quit date: 01/28/2012    Years since quitting: 11.3   Smokeless tobacco: Never  Vaping Use   Vaping status: Never Used  Substance and Sexual Activity   Alcohol use: No   Drug use: No   Sexual activity: Not Currently  Other Topics Concern   Not on file  Social History Narrative   Not on file   Social Drivers of Health   Financial Resource Strain: Not on file  Food Insecurity: No Food Insecurity (01/21/2019)   Received from [facility], [facility]   Hunger Vital Sign    Worried About Running Out of Food in  the Last Year: Never true    Ran Out of Food in the Last Year: Never true  Transportation Needs: No Transportation Needs (01/21/2019)   Received from [facility], [facility]   [facility] - Transportation    Lack of Transportation (Medical): No    Lack of Transportation (Non-Medical): No  Physical Activity: Not on file  Stress: No Stress Concern Present (11/14/2019)   Received from [facility], [facility] - Occupational Stress Questionnaire    Feeling of Stress : Not at all  Social Connections: Unknown (08/10/2021)   Received from [facility], [facility]   Social Network    Social Network: Not on file    Review of Systems: Gen: Denies fever, chills, anorexia. Denies fatigue, weakness, weight loss.  CV: Denies chest pain, palpitations, syncope, peripheral edema, and claudication. Resp: Denies dyspnea at rest, cough, wheezing, coughing up blood, and pleurisy. GI: Denies vomiting blood, jaundice, and fecal incontinence.   Denies dysphagia or odynophagia. Derm: Denies rash, itching, dry skin Psych: Denies depression, anxiety, memory loss, confusion. No homicidal or suicidal ideation.  Heme: Denies bruising, bleeding, and enlarged lymph nodes.  Physical Exam: There were no vitals taken for this visit. General:   Alert and oriented. No distress  noted. Pleasant and cooperative.  Head:  Normocephalic and atraumatic. Eyes:  Conjuctiva clear without scleral icterus. Heart:  S1, S2 present without murmurs appreciated. Lungs:  Clear to auscultation bilaterally. No wheezes, rales, or rhonchi. No distress.  Abdomen:  +BS, soft, non-tender and non-distended. No rebound or guarding. No HSM or masses noted. Msk:  Symmetrical without gross deformities. Normal posture. Extremities:  Without edema. Neurologic:  Alert and  oriented x4 Psych:  Normal mood and affect.    Assessment:     Plan:  ***   Ermalinda Memos, PA-C [facility] 06/16/2023

## 2023-06-16 ENCOUNTER — Encounter: Payer: Self-pay | Admitting: Gastroenterology

## 2023-06-16 ENCOUNTER — Ambulatory Visit: Admitting: Gastroenterology

## 2023-06-16 VITALS — BP 137/88 | HR 85 | Temp 98.0°F | Ht 63.0 in | Wt 219.6 lb

## 2023-06-16 DIAGNOSIS — R1310 Dysphagia, unspecified: Secondary | ICD-10-CM

## 2023-06-16 DIAGNOSIS — K581 Irritable bowel syndrome with constipation: Secondary | ICD-10-CM

## 2023-06-16 DIAGNOSIS — K219 Gastro-esophageal reflux disease without esophagitis: Secondary | ICD-10-CM | POA: Diagnosis not present

## 2023-06-16 DIAGNOSIS — R350 Frequency of micturition: Secondary | ICD-10-CM | POA: Diagnosis not present

## 2023-06-16 MED ORDER — DEXLANSOPRAZOLE 60 MG PO CPDR: 1.0000 | DELAYED_RELEASE_CAPSULE | Freq: Every day | ORAL | 1 refills | Status: DC

## 2023-06-16 NOTE — Patient Instructions (Signed)
 I would like to try you on Linzess 72 mcg daily in place of Linzess 145 mcg as you are not able to take Linzess 145 mcg daily due to diarrhea.  As we discussed, it is common for you to have diarrhea in the first week or 2 of taking Linzess, but I would encourage you to stick with it as the diarrhea tends to taper off.  However, if the diarrhea is too severe, please let me know. We are providing you with samples of Linzess 72 mcg today.  Please call next week to let me know how this works for you.  I will send a prescription if it works well.  Otherwise, we can try something different.  I am resending a prescription of Dexilant to your pharmacy.  I am asking my CMA to look into coverage for you.  If you have any problems taking the Dexilant up from your pharmacy, please let me know, and we can try something different.  You can go to [facility] to have a urinalysis completed to rule out a urinary tract infection.  If this is negative, you will need to follow-up with your primary care doctor on urinary frequency.  I will plan to see you back in 3 months or sooner if needed.  It was great to see you again today!  Ermalinda Memos, PA-C [facility]

## 2023-08-23 DIAGNOSIS — R051 Acute cough: Secondary | ICD-10-CM | POA: Diagnosis not present

## 2023-08-23 DIAGNOSIS — J019 Acute sinusitis, unspecified: Secondary | ICD-10-CM | POA: Diagnosis not present

## 2023-08-29 DIAGNOSIS — N1831 Chronic kidney disease, stage 3a: Secondary | ICD-10-CM | POA: Diagnosis not present

## 2023-08-29 DIAGNOSIS — R945 Abnormal results of liver function studies: Secondary | ICD-10-CM | POA: Diagnosis not present

## 2023-08-29 DIAGNOSIS — E7800 Pure hypercholesterolemia, unspecified: Secondary | ICD-10-CM | POA: Diagnosis not present

## 2023-08-29 DIAGNOSIS — E1165 Type 2 diabetes mellitus with hyperglycemia: Secondary | ICD-10-CM | POA: Diagnosis not present

## 2023-08-29 DIAGNOSIS — E7849 Other hyperlipidemia: Secondary | ICD-10-CM | POA: Diagnosis not present

## 2023-08-31 ENCOUNTER — Other Ambulatory Visit: Payer: Self-pay | Admitting: Gastroenterology

## 2023-08-31 DIAGNOSIS — K219 Gastro-esophageal reflux disease without esophagitis: Secondary | ICD-10-CM

## 2023-09-05 DIAGNOSIS — Z1231 Encounter for screening mammogram for malignant neoplasm of breast: Secondary | ICD-10-CM | POA: Diagnosis not present

## 2023-09-12 DIAGNOSIS — E7800 Pure hypercholesterolemia, unspecified: Secondary | ICD-10-CM | POA: Diagnosis not present

## 2023-09-12 DIAGNOSIS — N1831 Chronic kidney disease, stage 3a: Secondary | ICD-10-CM | POA: Diagnosis not present

## 2023-09-12 DIAGNOSIS — R051 Acute cough: Secondary | ICD-10-CM | POA: Diagnosis not present

## 2023-09-12 DIAGNOSIS — Z1389 Encounter for screening for other disorder: Secondary | ICD-10-CM | POA: Diagnosis not present

## 2023-09-12 DIAGNOSIS — Z0001 Encounter for general adult medical examination with abnormal findings: Secondary | ICD-10-CM | POA: Diagnosis not present

## 2023-09-12 DIAGNOSIS — D1724 Benign lipomatous neoplasm of skin and subcutaneous tissue of left leg: Secondary | ICD-10-CM | POA: Diagnosis not present

## 2023-09-12 DIAGNOSIS — L918 Other hypertrophic disorders of the skin: Secondary | ICD-10-CM | POA: Diagnosis not present

## 2023-09-12 DIAGNOSIS — E1165 Type 2 diabetes mellitus with hyperglycemia: Secondary | ICD-10-CM | POA: Diagnosis not present

## 2023-09-12 DIAGNOSIS — Z0000 Encounter for general adult medical examination without abnormal findings: Secondary | ICD-10-CM | POA: Diagnosis not present

## 2023-09-14 NOTE — Progress Notes (Deleted)
 Referring Provider: Leesa Pulling, MD Primary Care Physician:  Leesa Pulling, MD Primary GI Physician: Dr. Mordechai April  No chief complaint on file.   HPI:   Holly [name] is a 68 y.o. female with history of GERD, IBS-C, adenomatous colon polyps, history of recurrent sigmoid diverticulitis with most recent colonoscopy completed in December 2022 due for surveillance in 2032, presenting today for follow-up ***   Last seen in the office 06/16/2023.  GERD uncontrolled as she ran out of Dexilant .  Had some recurrent dysphagia since reflux uncontrolled but usually goes away when refluxes well-managed.  IBS not adequately managed as she was taking Linzess  every 3 to 4 days because daily dosing would cause diarrhea.  Recommended resuming Dexilant , decrease Linzess  to 72 mcg daily with samples provided and requested progress report in 1 week with follow-up in the office in 3 months.  No progress report received regarding Linzess  efficacy.  Today:    Past Medical History:  Diagnosis Date   Arthritis    knees   Constipation    chronic   Diverticulitis 2006   2015, 2019, March 2022, July 2023   Diverticulosis    Enteritis    Fatty liver disease, nonalcoholic OCT 2016 [facility]   DEC 2016 NL [facility]   Gallstone OCT 2016 [facility]   GERD (gastroesophageal reflux disease)    Hay fever    Headache(784.0)    IBS (irritable colon syndrome)    with constipation   Sinus problem     Past Surgical History:  Procedure Laterality Date   ABDOMINAL HYSTERECTOMY  2003   partial   CATARACT EXTRACTION Bilateral    COLONOSCOPY  02/2011   Dr. Tanis Fan: PROPOFOL . scattered diverticula, sessile polyp descending colon, path unavailable. recommenced five year follow up colonoscopy   COLONOSCOPY N/A 04/06/2015   Surgeon: Alyce Jubilee, MD;  moderate diverticulosis throughout the entire colon, 1 hyperplastic polyp, 1 tubular adenoma, and moderate sized internal hemorrhoids. Recommended repeat in  5-10 years.    COLONOSCOPY WITH PROPOFOL  N/A 03/25/2021   Surgeon: Vinetta Greening, DO;   nonbleeding internal hemorrhoids, multiple small and large mouth diverticula in the sigmoid colon, 2 sessile polyps 3-5 mm in size in the transverse colon s/p resection and retrieval, 1 polyp not retrieved.  Pathology showed inflammatory polyp.  Recommended 10-year screening colonoscopy.   FOOT SURGERY     bilateral -bone removal   KNEE CLOSED REDUCTION  04/02/2012   Procedure: CLOSED MANIPULATION KNEE;  Surgeon: Aurther Blue, MD;  Location: [facility];  Service: Orthopedics;  Laterality: Left;   POLYPECTOMY  03/25/2021   Procedure: POLYPECTOMY;  Surgeon: Vinetta Greening, DO;  [facility];  Service: Endoscopy;;   TONSILLECTOMY     age 31   TOTAL KNEE ARTHROPLASTY  01/30/2012   Procedure: TOTAL KNEE ARTHROPLASTY;  Surgeon: Aurther Blue, MD;  Location: [facility];  Service: Orthopedics;  Laterality: Left;    Current Outpatient Medications  Medication Sig Dispense Refill   acetaminophen  (TYLENOL ) 500 MG tablet Take 1,000 mg by mouth every 6 (six) hours as needed for mild pain.     albuterol  (VENTOLIN  HFA) 108 (90 Base) MCG/ACT inhaler Inhale 1-2 puffs into the lungs every 6 (six) hours as needed for wheezing or shortness of breath.     atorvastatin (LIPITOR) 20 MG tablet Take 20 mg by mouth daily.     BREO ELLIPTA 100-25 MCG/INH AEPB Inhale 1 puff into the lungs daily.  cyclobenzaprine (FLEXERIL) 10 MG tablet Take 10 mg by mouth at bedtime. (Patient not taking: Reported on 06/16/2023)     dexlansoprazole  (DEXILANT ) 60 MG capsule Take 1 capsule by mouth once daily 30 capsule 5   FARXIGA 5 MG TABS tablet Take 5 mg by mouth daily.     gabapentin (NEURONTIN) 300 MG capsule Take 300 mg by mouth at bedtime.     glipiZIDE (GLUCOTROL XL) 10 MG 24 hr tablet Take 10 mg by mouth daily.     hydrochlorothiazide (HYDRODIURIL) 25 MG tablet Take 25 mg by mouth every morning.     hydrOXYzine (VISTARIL) 25  MG capsule Take 25 mg by mouth 2 (two) times daily as needed.     linaclotide  (LINZESS ) 145 MCG CAPS capsule TAKE 1 CAPSULE BY MOUTH EVERY OTHER DAY 30 capsule 3   montelukast (SINGULAIR) 10 MG tablet Take 10 mg by mouth at bedtime.     naproxen  (NAPROSYN ) 500 MG tablet Take 1 tablet (500 mg total) by mouth 2 (two) times daily with a meal. 60 tablet 5   ondansetron  (ZOFRAN ) 4 MG tablet Take 1 tablet (4 mg total) by mouth every 8 (eight) hours as needed for nausea or vomiting. (Patient not taking: Reported on 11/07/2022) 20 tablet 0   ondansetron  (ZOFRAN -ODT) 4 MG disintegrating tablet Take 1 tablet (4 mg total) by mouth every 6 (six) hours as needed for nausea or vomiting. 30 tablet 0   ONETOUCH ULTRA test strip USE 1 STRIP TO CHECK GLUCOSE 4 TIMES DAILY     OZEMPIC, 0.25 OR 0.5 MG/DOSE, 2 MG/3ML SOPN SMARTSIG:0.25 Milliliter(s) SUB-Q Once a Week     Vitamin D, Ergocalciferol, (DRISDOL) 1.25 MG (50000 UNIT) CAPS capsule Take 50,000 Units by mouth once a week.     No current facility-administered medications for this visit.    Allergies as of 09/15/2023 - Review Complete 06/16/2023  Allergen Reaction Noted   Codeine Nausea And Vomiting 01/20/2012   Morphine  and codeine Nausea And Vomiting 01/20/2012    Family History  Problem Relation Age of Onset   Liver cancer Sister        deceased   Colon cancer Neg Hx     Social History   Socioeconomic History   Marital status: Single    Spouse name: Not on file   Number of children: 3   Years of education: Not on file   Highest education level: Not on file  Occupational History   Occupation: out of work  Tobacco Use   Smoking status: Former    Current packs/day: 0.00    Average packs/day: 0.5 packs/day for 40.0 years (20.0 ttl pk-yrs)    Types: Cigarettes    Start date: 01/28/1972    Quit date: 01/28/2012    Years since quitting: 11.6   Smokeless tobacco: Never  Vaping Use   Vaping status: Never Used  Substance and Sexual Activity    Alcohol use: No   Drug use: No   Sexual activity: Not Currently  Other Topics Concern   Not on file  Social History Narrative   Not on file   Social Drivers of Health   Financial Resource Strain: Not on file  Food Insecurity: No Food Insecurity (01/21/2019)   Received from [facility]   Hunger Vital Sign    Within the past 12 months, you worried that your food would run out before you got the money to buy more.: Never true    Within the past 12 months, the  food you bought just didn't last and you didn't have money to get more.: Never true  Transportation Needs: No Transportation Needs (01/21/2019)   Received from [facility] - Transportation    Lack of Transportation (Medical): No    Lack of Transportation (Non-Medical): No  Physical Activity: Not on file  Stress: No Stress Concern Present (11/14/2019)   Received from [facility] - Occupational Stress Questionnaire    Feeling of Stress : Not at all  Social Connections: Unknown (08/10/2021)   Received from [facility]   Social Network    Social Network: Not on file    Review of Systems: Gen: Denies fever, chills, anorexia. Denies fatigue, weakness, weight loss.  CV: Denies chest pain, palpitations, syncope, peripheral edema, and claudication. Resp: Denies dyspnea at rest, cough, wheezing, coughing up blood, and pleurisy. GI: Denies vomiting blood, jaundice, and fecal incontinence.   Denies dysphagia or odynophagia. Derm: Denies rash, itching, dry skin Psych: Denies depression, anxiety, memory loss, confusion. No homicidal or suicidal ideation.  Heme: Denies bruising, bleeding, and enlarged lymph nodes.  Physical Exam: There were no vitals taken for this visit. General:   Alert and oriented. No distress noted. Pleasant and cooperative.  Head:  Normocephalic and atraumatic. Eyes:  Conjuctiva clear without scleral icterus. Heart:  S1, S2 present without murmurs  appreciated. Lungs:  Clear to auscultation bilaterally. No wheezes, rales, or rhonchi. No distress.  Abdomen:  +BS, soft, non-tender and non-distended. No rebound or guarding. No HSM or masses noted. Msk:  Symmetrical without gross deformities. Normal posture. Extremities:  Without edema. Neurologic:  Alert and  oriented x4 Psych:  Normal mood and affect.    Assessment:     Plan:  ***   Shana Daring, PA-C [facility] 09/15/2023

## 2023-09-15 ENCOUNTER — Ambulatory Visit: Admitting: Gastroenterology

## 2023-09-15 ENCOUNTER — Encounter: Payer: Self-pay | Admitting: Gastroenterology

## 2023-10-31 DIAGNOSIS — B351 Tinea unguium: Secondary | ICD-10-CM | POA: Diagnosis not present

## 2023-10-31 DIAGNOSIS — M79675 Pain in left toe(s): Secondary | ICD-10-CM | POA: Diagnosis not present

## 2023-10-31 DIAGNOSIS — E1142 Type 2 diabetes mellitus with diabetic polyneuropathy: Secondary | ICD-10-CM | POA: Diagnosis not present

## 2023-10-31 DIAGNOSIS — L84 Corns and callosities: Secondary | ICD-10-CM | POA: Diagnosis not present

## 2023-10-31 DIAGNOSIS — M79674 Pain in right toe(s): Secondary | ICD-10-CM | POA: Diagnosis not present

## 2023-11-17 ENCOUNTER — Encounter: Payer: Self-pay | Admitting: Radiology

## 2023-12-13 DIAGNOSIS — E1122 Type 2 diabetes mellitus with diabetic chronic kidney disease: Secondary | ICD-10-CM | POA: Diagnosis not present

## 2023-12-13 DIAGNOSIS — R945 Abnormal results of liver function studies: Secondary | ICD-10-CM | POA: Diagnosis not present

## 2023-12-13 DIAGNOSIS — E1165 Type 2 diabetes mellitus with hyperglycemia: Secondary | ICD-10-CM | POA: Diagnosis not present

## 2023-12-13 DIAGNOSIS — N1831 Chronic kidney disease, stage 3a: Secondary | ICD-10-CM | POA: Diagnosis not present

## 2023-12-13 DIAGNOSIS — Z0001 Encounter for general adult medical examination with abnormal findings: Secondary | ICD-10-CM | POA: Diagnosis not present

## 2023-12-13 DIAGNOSIS — I10 Essential (primary) hypertension: Secondary | ICD-10-CM | POA: Diagnosis not present

## 2024-01-29 ENCOUNTER — Encounter: Payer: Self-pay | Admitting: Radiology

## 2024-03-31 ENCOUNTER — Other Ambulatory Visit: Payer: Self-pay | Admitting: Gastroenterology

## 2024-03-31 DIAGNOSIS — K219 Gastro-esophageal reflux disease without esophagitis: Secondary | ICD-10-CM

## 2024-04-23 NOTE — Progress Notes (Unsigned)
 "  GI Office Note    Referring Provider: Toribio Jerel MATSU, MD Primary Care Physician:  Toribio Jerel MATSU, MD Primary Gastroenterologist: Carlin POUR. Cindie, DO  Date:  04/24/2024  ID:  Holly [name], DOB [birth year], MRN [id number]   Chief Complaint   Chief Complaint  Patient presents with   Nausea    Nausea and vomiting on Friday. Pain on left side. Constipation.    History of Present Illness  Holly [name] is a 69 y.o. female with a history of IBS-C, GERD, diabetes, adenomatous colon polyps, recurrent sigmoid diverticulitis with last colonoscopy in December 2022 presenting today with complaint of left-sided abdominal pain and constipation with recent nausea and vomiting.  Colonoscopy January 2017: - The LEFT colon IS SLIGHTLY redundant  - Moderate diverticulosis throughout the entire examined colon - Two 3 mm sigmoid COLON polyps REMOVED  - Moderate sized internal hemorrhoids - Repeat colonoscopy in 5-10 years.   Colonoscopy December 2022: - Non- bleeding internal hemorrhoids.  - Diverticulosis in the sigmoid colon. - Two 3 to 5 mm polyps in the transverse colon, removed with a cold snare. Resected and retrieved. - Repeat colonoscopy in 5 years.   CT A/P with contrast 11/10/2022 with gallbladder sludge, cholelithiasis, bilateral renal cysts, diverticulosis without diverticulitis.   Last office visit March 2025 with Josette Centers, PA-C.  Having daily reflux since she ran out of Dexilant .  Had never been on any other PPI.  Did note some dysphagia, she has reflux but it goes away when her reflux is controlled.  Having bowel movement every 3-4 days.  Taking Linzess  every 3-4 days because daily use causes diarrhea.  Denied any melena or PR.  Mild lower abdominal soreness that is chronic.  Noted frequent nocturia, concern for UTI.  Ordered UA with reflex microscopix exam.  Advised to resume Dexilant , will do PA if needed.  Advised to stop Linzess  145 and start Linzess  72 mcg daily and call  with a progress report.  Follow-up in 3 months.  Today:  Discussed the use of AI scribe software for clinical note transcription with the patient, who gave verbal consent to proceed.  Constipation has been longstanding but has worsened over the past two weeks after an increase in Ozempic dose. Bowel movements are infrequent, leading to nausea and episodes of vomiting. She was unable to keep water  down from Friday through Saturday morning, but is now able to drink water . Eating is limited due to abdominal discomfort and early satiety, resulting in a fourteen pound weight loss over two weeks, which she attributes to decreased oral intake after the dose increase of Ozempic. She describes feeling very unwell, stating she feels like I'm dying due to the severity of her symptoms.  Linzess  was previously taken at 145 mcg daily, but this caused excessive diarrhea, so the dose was reduced to 72 mcg (samples given previously). She currently takes Linzess  145 mcg intermittently, as daily use causes frequent bowel movements. She has tried every other day dosing as advised, but this regimen is not effective with the recent worsening of constipation. She has not tried Linzess  more frequently than every other day in the past two weeks. No additional fiber supplements are used, but she eats greens and green beans. No blood or black stool noted.  Abdominal pain and soreness are present around the navel and left side. History includes partial hysterectomy and significant abdominal scar tissue. Last abdominal imaging was approximately a year and a half ago. She reports having abdominal  pain and has previously received antibiotics for flares, including a course in December for the flu.  She experiences a sensation of food getting stuck in the back of her throat with every meal, causing gagging and a feeling of choking. Difficulty swallowing is present and she requests evaluation for this symptom. No recent imaging for  this complaint.  Dexilant  is taken for acid reflux and she requests a refill. Flonase and Astelin are also used. She is currently taking prednisone . She receives regular vaccinations, including flu and pneumonia shots, but developed the flu in December and was treated with antibiotics at that time. No recent nausea medication and she requests a refill of Zofran . Some belching is present but not excessive.       Wt Readings from Last 6 Encounters:  04/24/24 204 lb 12.8 oz (92.9 kg)  06/16/23 219 lb 9.6 oz (99.6 kg)  11/07/22 214 lb (97.1 kg)  02/23/22 209 lb (94.8 kg)  12/13/21 202 lb 12.8 oz (92 kg)  10/06/21 204 lb (92.5 kg)    Body mass index is 37.46 kg/m.   Current Outpatient Medications  Medication Sig Dispense Refill   acetaminophen  (TYLENOL ) 500 MG tablet Take 1,000 mg by mouth every 6 (six) hours as needed for mild pain.     albuterol  (VENTOLIN  HFA) 108 (90 Base) MCG/ACT inhaler Inhale 1-2 puffs into the lungs every 6 (six) hours as needed for wheezing or shortness of breath.     atorvastatin (LIPITOR) 20 MG tablet Take 20 mg by mouth daily.     BREO ELLIPTA 100-25 MCG/INH AEPB Inhale 1 puff into the lungs daily.     cyclobenzaprine (FLEXERIL) 10 MG tablet Take 10 mg by mouth at bedtime.     dexlansoprazole  (DEXILANT ) 60 MG capsule Take 1 capsule by mouth once daily 30 capsule 0   FARXIGA 5 MG TABS tablet Take 5 mg by mouth daily.     gabapentin (NEURONTIN) 300 MG capsule Take 300 mg by mouth at bedtime.     glipiZIDE (GLUCOTROL XL) 10 MG 24 hr tablet Take 10 mg by mouth daily.     hydrochlorothiazide (HYDRODIURIL) 25 MG tablet Take 25 mg by mouth every morning.     hydrOXYzine (VISTARIL) 25 MG capsule Take 25 mg by mouth 2 (two) times daily as needed.     linaclotide  (LINZESS ) 145 MCG CAPS capsule TAKE 1 CAPSULE BY MOUTH EVERY OTHER DAY 30 capsule 3   montelukast (SINGULAIR) 10 MG tablet Take 10 mg by mouth at bedtime.     naproxen  (NAPROSYN ) 500 MG tablet Take 1 tablet  (500 mg total) by mouth 2 (two) times daily with a meal. 60 tablet 5   ONETOUCH ULTRA test strip USE 1 STRIP TO CHECK GLUCOSE 4 TIMES DAILY     OZEMPIC, 0.25 OR 0.5 MG/DOSE, 2 MG/3ML SOPN SMARTSIG:0.25 Milliliter(s) SUB-Q Once a Week     Vitamin D, Ergocalciferol, (DRISDOL) 1.25 MG (50000 UNIT) CAPS capsule Take 50,000 Units by mouth once a week.     ondansetron  (ZOFRAN ) 4 MG tablet Take 1 tablet (4 mg total) by mouth every 8 (eight) hours as needed for nausea or vomiting. (Patient not taking: Reported on 04/24/2024) 20 tablet 0   ondansetron  (ZOFRAN -ODT) 4 MG disintegrating tablet Take 1 tablet (4 mg total) by mouth every 6 (six) hours as needed for nausea or vomiting. 30 tablet 0   No current facility-administered medications for this visit.    Past Medical History:  Diagnosis Date   Arthritis  knees   Constipation    chronic   Diverticulitis 2006   2015, 2019, March 2022, July 2023   Diverticulosis    Enteritis    Fatty liver disease, nonalcoholic OCT 2016 U/[facility]   DEC 2016 NL [facility]   Gallstone OCT 2016 [facility]   GERD (gastroesophageal reflux disease)    Hay fever    Headache(784.0)    IBS (irritable colon syndrome)    with constipation   Sinus problem     Past Surgical History:  Procedure Laterality Date   ABDOMINAL HYSTERECTOMY  2003   partial   CATARACT EXTRACTION Bilateral    COLONOSCOPY  02/2011   Dr. Lonni Raw: PROPOFOL . scattered diverticula, sessile polyp descending colon, path unavailable. recommenced five year follow up colonoscopy   COLONOSCOPY N/A 04/06/2015   Surgeon: Margo LITTIE Haddock, MD;  moderate diverticulosis throughout the entire colon, 1 hyperplastic polyp, 1 tubular adenoma, and moderate sized internal hemorrhoids. Recommended repeat in 5-10 years.    COLONOSCOPY WITH PROPOFOL  N/A 03/25/2021   Surgeon: Cindie Carlin POUR, DO;   nonbleeding internal hemorrhoids, multiple small and large mouth diverticula in the sigmoid colon, 2 sessile polyps  3-5 mm in size in the transverse colon s/p resection and retrieval, 1 polyp not retrieved.  Pathology showed inflammatory polyp.  Recommended 10-year screening colonoscopy.   FOOT SURGERY     bilateral -bone removal   KNEE CLOSED REDUCTION  04/02/2012   Procedure: CLOSED MANIPULATION KNEE;  Surgeon: Dempsey LULLA Moan, MD;  Location: [facility];  Service: Orthopedics;  Laterality: Left;   POLYPECTOMY  03/25/2021   Procedure: POLYPECTOMY;  Surgeon: Cindie Carlin POUR, DO;  Location: [facility];  Service: Endoscopy;;   TONSILLECTOMY     age 24   TOTAL KNEE ARTHROPLASTY  01/30/2012   Procedure: TOTAL KNEE ARTHROPLASTY;  Surgeon: Dempsey LULLA Moan, MD;  Location: [facility];  Service: Orthopedics;  Laterality: Left;    Family History  Problem Relation Age of Onset   Liver cancer Sister        deceased   Colon cancer Neg Hx     Allergies as of 04/24/2024 - Review Complete 04/24/2024  Allergen Reaction Noted   Codeine Nausea And Vomiting 01/20/2012   Morphine  and codeine Nausea And Vomiting 01/20/2012    Social History   Socioeconomic History   Marital status: Single    Spouse name: Not on file   Number of children: 3   Years of education: Not on file   Highest education level: Not on file  Occupational History   Occupation: out of work  Tobacco Use   Smoking status: Former    Current packs/day: 0.00    Average packs/day: 0.5 packs/day for 40.0 years (20.0 ttl pk-yrs)    Types: Cigarettes    Start date: 01/28/1972    Quit date: 01/28/2012    Years since quitting: 12.2   Smokeless tobacco: Never  Vaping Use   Vaping status: Never Used  Substance and Sexual Activity   Alcohol use: No   Drug use: No   Sexual activity: Not Currently  Other Topics Concern   Not on file  Social History Narrative   Not on file   Social Drivers of Health   Tobacco Use: Medium Risk (04/24/2024)   Patient History    Smoking Tobacco Use: Former    Smokeless Tobacco Use: Never    Passive Exposure: Not on  Actuary Strain: Not on file  Food Insecurity: Not  on file  Transportation Needs: Not on file  Physical Activity: Not on file  Stress: Not on file  Social Connections: Unknown (08/10/2021)   [facility]   Social Network    Social Network: Not on file  Depression (PHQ2-9): Not on file  Alcohol Screen: Not on file  Housing: Not on file  Utilities: Not on file  Health Literacy: Low Risk (03/15/2023)   Received from [facility]   Health Literacy    : Never    Review of Systems   Gen: + fatigue and weight loss. Denies fever, chills, anorexia. Denies weakness. CV: Denies chest pain, palpitations, syncope, peripheral edema, and claudication. Resp: Denies dyspnea at rest, cough, wheezing, coughing up blood, and pleurisy. GI: See HPI Derm: Denies rash, itching, dry skin Psych: Denies depression, anxiety, memory loss, confusion. No homicidal or suicidal ideation.  Heme: Denies bruising, bleeding, and enlarged lymph nodes.  Physical Exam   BP 129/80 (BP Location: Right Arm, Patient Position: Sitting, Cuff Size: Large)   Pulse 100   Temp 98 F (36.7 C) (Temporal)   Ht 5' 2 (1.575 m)   Wt 204 lb 12.8 oz (92.9 kg)   BMI 37.46 kg/m   General:   Alert and oriented. No distress noted. Pleasant and cooperative.  Head:  Normocephalic and atraumatic. Eyes:  Conjuctiva clear without scleral icterus. Mouth:  Oral mucosa pink and moist. Good dentition. No lesions. Lungs:  Clear to auscultation bilaterally. No wheezes, rales, or rhonchi. No distress.  Heart:  S1, S2 present without murmurs appreciated.  Abdomen:  +BS, soft, non-distended. Moderate ttp to LLQ. No rebound or guarding. No HSM or masses noted. Rectal: deferred Msk:  Symmetrical without gross deformities. Normal posture. Extremities:  Without edema. Neurologic:  Alert and  oriented x4 Psych:  Alert and cooperative. Normal mood and affect.  Assessment & Plan  Holly [name] is a 69 y.o.  female presenting today with constipation and abdominal pain.     Nausea and vomiting, early satiety Most likely secondary to medication side effect from increased Ozempic use which is known to induce gastroparesis. This could also be causing the fullness sensation she has been having. Had 24 hours of nausea/vomiting after her most recent injection. May not be able to tolerate higher dosing. Constipation could also be a contributing factor.  - Will refill zofran  - recommended may use up to 3 times a day at meal time to allow her to eat.  - Advised 4-6 small meals instead of 2-3 larger meals daily.  - Will perform EGD for dysphagia therefore can also assess for any additional cause of nausea/vomiting (gastritis, duodenitis, esophagitis, PUD).   Acute diverticulitis Known diverticulosis with possible prior diverticulitis in 2024 although imaging not confirmed at that time (possible healing given there was some prior antibiotic use that improved symptoms. Acute flare with increased abdominal pain and localized tenderness to the LLQ, more severe than baseline, likely precipitated by constipation. Pain has been increasing in severity above her baseline pain over the last 4-5 days along with nausea and vomiting (possibly also secondary to increased Ozempic use). At risk for adhesions as well give prior abdominal surgeries.  - Prescribed amoxicillin -clavulanate (Augmentin ) for 5 days given worsening symptoms over time.  - Advised to report lack of improvement within 2-3 days for consideration of follow-up imaging, if no improvement may need to seek care in the ED.  Irritable bowel syndrome with constipation Chronic constipation-predominant IBS exacerbated by recent semaglutide (Ozempic) dose  increase, with suboptimal response to current Linaclotide  (Linzess ) dosing (145 mcg) and absence of fiber supplementation, contributing to abdominal pain and nausea. Given severe diarrhea with regular use of Linzess  145  mcg will decrease dosing and encouraged daily use to have more bowel regularity.  - Prescribed Linaclotide  72 mcg daily and provided samples to assess tolerability and efficacy. - Instructed to monitor for diarrhea or inadequate response and report after several days. - Advised to continue current bowel regimen and adjust based on response to new Linaclotide  dose.  Dysphagia Chronic, daily dysphagia with sensation of food impaction at every meal, concerning for esophageal narrowing or stricture requiring direct evaluation and possible intervention. Differential includes  - Ordered EGD with possible dilation  Gastroesophageal reflux disease Dexilant  prescribed for GERD and reports overall good control of symptoms. - Continue Dexilant  60 mg daily. Refilled today.  - GERD diet     Procedure: Proceed with upper endoscopy with possible dilation with propofol  by Dr. Cindie in near future: the risks, benefits, and alternatives have been discussed with the patient in detail. The patient states understanding and desires to proceed. ASA 2  Hold Ozempic for 1 week  Hold Farxiga for 3 days prior to procedure and morning of  Hold glipizide morning of procedure   Follow up   Follow up post EGD.    Charmaine Melia, MSN, FNP-BC, AGACNP-BC [facility] "

## 2024-04-24 ENCOUNTER — Ambulatory Visit: Admitting: Gastroenterology

## 2024-04-24 ENCOUNTER — Telehealth: Payer: Self-pay | Admitting: *Deleted

## 2024-04-24 ENCOUNTER — Encounter: Payer: Self-pay | Admitting: *Deleted

## 2024-04-24 ENCOUNTER — Encounter: Payer: Self-pay | Admitting: Gastroenterology

## 2024-04-24 VITALS — BP 129/80 | HR 100 | Temp 98.0°F | Ht 62.0 in | Wt 204.8 lb

## 2024-04-24 DIAGNOSIS — R1310 Dysphagia, unspecified: Secondary | ICD-10-CM | POA: Diagnosis not present

## 2024-04-24 DIAGNOSIS — K5730 Diverticulosis of large intestine without perforation or abscess without bleeding: Secondary | ICD-10-CM

## 2024-04-24 DIAGNOSIS — R1032 Left lower quadrant pain: Secondary | ICD-10-CM

## 2024-04-24 DIAGNOSIS — R6881 Early satiety: Secondary | ICD-10-CM | POA: Diagnosis not present

## 2024-04-24 DIAGNOSIS — K219 Gastro-esophageal reflux disease without esophagitis: Secondary | ICD-10-CM

## 2024-04-24 DIAGNOSIS — R110 Nausea: Secondary | ICD-10-CM

## 2024-04-24 DIAGNOSIS — R112 Nausea with vomiting, unspecified: Secondary | ICD-10-CM

## 2024-04-24 DIAGNOSIS — K581 Irritable bowel syndrome with constipation: Secondary | ICD-10-CM

## 2024-04-24 DIAGNOSIS — K5792 Diverticulitis of intestine, part unspecified, without perforation or abscess without bleeding: Secondary | ICD-10-CM

## 2024-04-24 MED ORDER — LINACLOTIDE 72 MCG PO CAPS: 72.0000 ug | ORAL_CAPSULE | Freq: Every day | ORAL | Status: AC

## 2024-04-24 MED ORDER — DEXLANSOPRAZOLE 60 MG PO CPDR: 1.0000 | DELAYED_RELEASE_CAPSULE | Freq: Every day | ORAL | 0 refills | Status: AC

## 2024-04-24 MED ORDER — AMOXICILLIN-POT CLAVULANATE 875-125 MG PO TABS: 1.0000 | ORAL_TABLET | Freq: Two times a day (BID) | ORAL | 0 refills | Status: AC

## 2024-04-24 MED ORDER — LINACLOTIDE 72 MCG PO CAPS: 72.0000 ug | ORAL_CAPSULE | Freq: Every day | ORAL | 2 refills | Status: AC

## 2024-04-24 MED ORDER — ONDANSETRON HCL 4 MG PO TABS: 4.0000 mg | ORAL_TABLET | Freq: Three times a day (TID) | ORAL | 0 refills | Status: AC | PRN

## 2024-04-24 NOTE — Patient Instructions (Signed)
 We are get you scheduled for an upper endoscopy with dilation in the near future with Dr. Cindie to evaluate your swallowing.  I am giving you some samples of Linzess  72 mcg for you to start taking 1 of these every day, hopefully you will not have any significant diarrhea every day with this.  I will also send in the prescription for the Linzess  for you.  If you do not would like this is working then we will go back to the 145 mcg dose.  You could also consider adding fiber supplementation daily to help with constipation (Metamucil or Benefiber).   I have also refilled your Zofran , you can take this up to 3 times a day, try taking about 20-30 minutes before attempting to eat something deceiving keep it down.  While on Ozempic it would be best for you to try to eat 4-6 small meals per day versus 2-3 larger meals to avoid that overeating/fullness feeling.  Follow a GERD diet:  Avoid fried, fatty, greasy, spicy, citrus foods. Avoid caffeine and carbonated beverages. Avoid chocolate. Try eating 4-6 small meals a day rather than 3 large meals. Do not eat within 3 hours of laying down. Prop head of bed up on wood or bricks to create a 6 inch incline.  I also refilled your Dexilant  for you today as well.  We will see you for follow-up after your upper endoscopy.  It was a pleasure to see you today. I want to create trusting relationships with patients. If you receive a survey regarding your visit,  I greatly appreciate you taking time to fill this out on paper or through your MyChart. I value your feedback.  Charmaine Melia, MSN, FNP-BC, AGACNP-BC [facility]

## 2024-04-24 NOTE — Telephone Encounter (Signed)
 Medication Samples have been provided to the patient.  Drug name: Linzess     Strength: 72mcg        Qty: 3 boxes  LOT: [id number]  Exp.Date: 03/2026  Dosing instructions: take one 30 mins before breakfast   The patient has been instructed regarding the correct time, dose, and frequency of taking this medication, including desired effects and most common side effects.   Holly [name] 11:20 AM 04/24/2024

## 2024-04-30 ENCOUNTER — Telehealth: Payer: Self-pay | Admitting: *Deleted

## 2024-04-30 NOTE — Telephone Encounter (Signed)
 LMOVM to call back to reschedule procedure for Friday with Dr. Cindie

## 2024-05-01 NOTE — Telephone Encounter (Signed)
 Spoke with pt. She rescheduled with Dr. Cindie to 2/24. Aware will send new instructions and will let endo know.

## 2024-05-17 ENCOUNTER — Other Ambulatory Visit (HOSPITAL_COMMUNITY)

## 2024-05-21 ENCOUNTER — Encounter (HOSPITAL_COMMUNITY): Admission: RE | Payer: Self-pay | Source: Home / Self Care

## 2024-05-21 ENCOUNTER — Ambulatory Visit (HOSPITAL_COMMUNITY): Admission: RE | Admit: 2024-05-21 | Source: Home / Self Care | Admitting: Internal Medicine
